# Patient Record
Sex: Female | Born: 1995 | Race: Black or African American | Hispanic: No | Marital: Single | Smoking: Never smoker
Health system: Southern US, Community
[De-identification: ages and names within clinical notes are randomized; demographics above are authoritative.]

---
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
## 2016-03-05 ENCOUNTER — Emergency Department (HOSPITAL_COMMUNITY)
Admission: EM | Admit: 2016-03-05 | Discharge: 2016-03-05 | Disposition: A | Discharge status: Home / Self Care | Payer: Self-pay | Attending: Emergency Medicine | Admitting: Emergency Medicine

## 2016-03-05 ENCOUNTER — Encounter (HOSPITAL_COMMUNITY): Payer: Self-pay | Admitting: Family Medicine

## 2016-03-05 DIAGNOSIS — J029 Acute pharyngitis, unspecified: Secondary | ICD-10-CM | POA: Insufficient documentation

## 2016-03-05 MED ORDER — ACETAMINOPHEN 325 MG PO TABS
ORAL_TABLET | ORAL | Status: AC
  Filled 2016-03-05: qty 2

## 2016-03-05 MED ORDER — OSELTAMIVIR PHOSPHATE 75 MG PO CAPS: 75.0000 mg | ORAL_CAPSULE | Freq: Two times a day (BID) | ORAL | 0 refills | Status: AC

## 2016-03-05 MED ORDER — AZITHROMYCIN 250 MG PO TABS: ORAL_TABLET | ORAL | 0 refills | Status: AC

## 2016-03-05 MED ORDER — ACETAMINOPHEN 325 MG PO TABS
650.0000 mg | ORAL_TABLET | Freq: Once | ORAL | Status: AC | PRN
  Administered 2016-03-05: 650 mg via ORAL

## 2016-03-05 NOTE — ED Triage Notes (Signed)
Pt presents from home via POV with c/o sore throat, non-productive cough, and generalized body aches that began suddenly yesterday. Pt is ambulatory in treatment area A&Ox4 in NAD. Lungs CTA

## 2016-03-05 NOTE — ED Provider Notes (Signed)
[facility]-EMERGENCY DEPT Provider Note   CSN: [id number] Arrival date & time: 03/05/16  1728  By signing my name below, I, Teofilo PodMatthew P. Jamison, attest that this documentation has been prepared under the direction and in the presence of Bethann BerkshireJoseph Libni Fusaro, MD . Electronically Signed: Teofilo PodMatthew P. Jamison, ED Scribe. 03/05/2016. 6:18 PM.    History   Chief Complaint Chief Complaint  Patient presents with  . Sore Throat  . Cough    The history is provided by the patient. No language interpreter was used.  Sore Throat  This is a new problem. The current episode started yesterday. The problem occurs constantly. The problem has not changed since onset.Associated symptoms include headaches. Pertinent negatives include no chest pain and no abdominal pain. Nothing aggravates the symptoms. Nothing relieves the symptoms. The treatment provided no relief.   HPI Comments:  Hannah Marshall is a 21 y.o. female who presents to the Emergency Department complaining of constant sore throat since yesterday. Pt complains of associated cough, ear pain, generalized body aches, headache, chills. Pt smoked marijuana 10 days ago. Pt has taken Mucinex, Robitussin, Tylenol and Dayquil with no relief. Pt denies other associated symptoms.   History reviewed. No pertinent past medical history.  There are no active problems to display for this patient.   History reviewed. No pertinent surgical history.  OB History    No data available       Home Medications    Prior to Admission medications   Not on File    Family History No family history on file.  Social History Social History  Substance Use Topics  . Smoking status: Never Smoker  . Smokeless tobacco: Never Used  . Alcohol use No     Allergies   Patient has no known allergies.   Review of Systems Review of Systems  Constitutional: Positive for chills. Negative for appetite change and fatigue.  HENT: Positive for ear pain and sore throat. Negative  for congestion, ear discharge and sinus pressure.   Eyes: Negative for discharge.  Respiratory: Positive for cough.   Cardiovascular: Negative for chest pain.  Gastrointestinal: Negative for abdominal pain and diarrhea.  Genitourinary: Negative for frequency and hematuria.  Musculoskeletal: Positive for myalgias. Negative for back pain.  Skin: Negative for rash.  Neurological: Positive for headaches. Negative for seizures.  Psychiatric/Behavioral: Negative for hallucinations.     Physical Exam Updated Vital Signs BP 101/71 (BP Location: Left Arm)   Pulse 120   Temp 102 F (38.9 C) (Oral)   Resp 16   Ht 5' (1.524 m)   Wt 198 lb (89.8 kg)   SpO2 99%   BMI 38.67 kg/m   Physical Exam  Constitutional: She is oriented to person, place, and time. She appears well-developed.  HENT:  Head: Normocephalic.  Pharynx mildly inflamed.  Eyes: Conjunctivae and EOM are normal. No scleral icterus.  Neck: Neck supple. No thyromegaly present.  Cardiovascular: Normal rate and regular rhythm.  Exam reveals no gallop and no friction rub.   No murmur heard. Pulmonary/Chest: No stridor. She has no wheezes. She has no rales. She exhibits no tenderness.  Abdominal: She exhibits no distension. There is no tenderness. There is no rebound.  Musculoskeletal: Normal range of motion. She exhibits no edema.  Lymphadenopathy:    She has no cervical adenopathy.  Neurological: She is oriented to person, place, and time. She exhibits normal muscle tone. Coordination normal.  Skin: No rash noted. No erythema.  Psychiatric: She has a normal mood and  affect. Her behavior is normal.     ED Treatments / Results  DIAGNOSTIC STUDIES:  Oxygen Saturation is 99% on RA, normal by my interpretation.    COORDINATION OF CARE:  6:18 PM Discussed treatment plan with pt at bedside and pt agreed to plan.   Labs (all labs ordered are listed, but only abnormal results are displayed) Labs Reviewed - No data to  display  EKG  EKG Interpretation None       Radiology No results found.  Procedures Procedures (including critical care time)  Medications Ordered in ED Medications  acetaminophen (TYLENOL) 325 MG tablet (not administered)  acetaminophen (TYLENOL) tablet 650 mg (650 mg Oral Given 03/05/16 1757)     Initial Impression / Assessment and Plan / ED Course  I have reviewed the triage vital signs and the nursing notes.  Pertinent labs & imaging results that were available during my care of the patient were reviewed by me and considered in my medical decision making (see chart for details).     Patient with bronchitis possibly related to influenza. Patient sent home on Tamiflu and Z-Pak and will follow-up as needed  Final Clinical Impressions(s) / ED Diagnoses   Final diagnoses:  None    New Prescriptions New Prescriptions   No medications on file  The chart was scribed for me under my direct supervision.  I personally performed the history, physical, and medical decision making and all procedures in the evaluation of this patient.Bethann Berkshire, MD 03/05/16 [phone]

## 2016-03-05 NOTE — Discharge Instructions (Signed)
Drink plenty of fluids. Take Tylenol or Motrin for fever and aches. Follow-up if not improving

## 2016-03-05 NOTE — ED Notes (Signed)
Pt provided goodrx coupons for medications since pt reported concern affording her prescriptions.

## 2020-12-28 ENCOUNTER — Inpatient Hospital Stay: Admit: 2020-12-28 | Discharge: 2020-12-28 | Payer: PRIVATE HEALTH INSURANCE

## 2020-12-28 MED ORDER — IBUPROFEN 600 MG TABLET
600 mg | Freq: Once | ORAL | Status: CP
Start: 2020-12-28 — End: ?
  Administered 2020-12-28: 15:00:00 600 mg via ORAL

## 2020-12-28 MED ORDER — ACETAMINOPHEN 325 MG TABLET
325 mg | Freq: Once | ORAL | Status: CP
Start: 2020-12-28 — End: ?
  Administered 2020-12-28: 15:00:00 325 mg via ORAL

## 2020-12-28 NOTE — ED Provider Notes
HistoryChief Complaint Patient presents with ? Flu Like Symptoms  25 year old female with no significant past medical history presenting to the ED for flu-like symptoms x3 days.  Symptoms include generalized body aches w/ CP and back pain, subjective fevers, chills, headache, sore throat, nasal congestion, nonproductive cough.  She is been taking DayQuil and NyQuil without relief.  No sick contacts, states she recently moved from [place] to [place].  She is COVID vaccinated, not flu vaccinated.  She is a nonsmoker.The history is provided by the patient. No language interpreter was used. OtherThis is a new problem. Episode onset: 3 days. The problem occurs constantly. The problem has not changed since onset.Associated symptoms include chest pain and headaches. Pertinent negatives include no abdominal pain and no shortness of breath. Nothing aggravates the symptoms. Nothing relieves the symptoms. Treatments tried: dayquil, nyquil.  No past medical history on file.No past surgical history on file.No family history on file.Social History Socioeconomic History ? Marital status: Single No existing history information found.No existing history information found.No existing history information found.Review of Systems Constitutional: Positive for chills, fatigue and fever. HENT: Positive for congestion and sore throat.  Respiratory: Positive for cough. Negative for shortness of breath.  Cardiovascular: Positive for chest pain. Gastrointestinal: Negative for abdominal pain, nausea and vomiting. Genitourinary: Negative for difficulty urinating. Musculoskeletal: Positive for back pain and myalgias. Neurological: Positive for headaches. All other systems reviewed and are negative. Physical ExamED Triage Vitals [12/28/20 0842]BP: 108/68Pulse: (!) 56Pulse from  O2 sat: n/aResp: 18Temp: (!) 100.8 ?F (38.2 ?C)Temp src: OralSpO2: 100 % BP 108/68  - Pulse (!) 56  - Temp (!) 100.8 ?F (38.2 ?C) (Oral)  - Resp 18  - SpO2 100% Physical ExamVitals and nursing note reviewed. Constitutional:     General: She is not in acute distress.   Appearance: She is well-developed. She is ill-appearing. She is not diaphoretic. HENT:    Head: Normocephalic and atraumatic.    Nose: Nose normal.    Mouth/Throat:    Mouth: Mucous membranes are dry.    Pharynx: No oropharyngeal exudate or posterior oropharyngeal erythema. Eyes:    General:       Right eye: No discharge.       Left eye: No discharge. Cardiovascular:    Rate and Rhythm: Normal rate and regular rhythm.    Heart sounds: Normal heart sounds. No murmur heard.  No friction rub. No gallop. Pulmonary:    Effort: Pulmonary effort is normal. No respiratory distress.    Breath sounds: Normal breath sounds. No stridor. No wheezing, rhonchi or rales. Chest:    Chest wall: Tenderness (generalized) present. Abdominal:    General: There is no distension.    Palpations: Abdomen is soft.    Tenderness: There is no abdominal tenderness. There is no right CVA tenderness, left CVA tenderness or guarding. Musculoskeletal:       General: No deformity. Normal range of motion.    Cervical back: Normal range of motion. Skin:   General: Skin is warm and dry. Neurological:    Mental Status: She is alert.  ProceduresProceduresResident/APP ZOX:[id number] & Plan87 year old female with no significant past medical history presenting to the ED for flu-like symptoms x3 days.  Symptoms include generalized body aches w/ CP and back pain, subjective fevers, chills, headache, sore throat, nasal congestion, nonproductive cough.  Well appearing, no distress.  BP 108/68  - Pulse (!) 56  - Temp (!) 100.8 ?F (38.2 ?C) (Oral)  - Resp 18  - SpO2 100%   Exam  as above.   Plan: ddx include influenza, COVID-19, other viral illness; considered but doubt pneumonia, do not suspect ACSLabs:  Outpatient COVID/flu swabImaging:  NoneInterventions:  Tylenol, MotrinConsults:  NoneSupportive careECC appointment for 12/12Patient stable and in no distress.  Okay for discharge.  Follow-up per discharge instructions.  Return precautions given.  Patient agrees with plan.  Dr. Shawn Route available for Graciella Belton, PA-CED CourseClinical Impressions as of 12/28/20 0945 Flu-like symptoms  ED DispositionDischarge Irma Newness, PA12/07/22 0945

## 2020-12-28 NOTE — Discharge Instructions
-  You have an ED follow up appointment on Monday 12/12-Unless a doctor has told you not to take these medications, you should take acetaminophen (Tylenol) 650mg  every 4-6 hours and/or ibuprofen (Motrin/Advil) 600mg  every 8 hours with food as needed for pain/feversDrink plenty of fluids and rest-Return to the ED for new or worsening symptoms

## 2020-12-28 NOTE — ED Notes
98:97 AM 25 year old female her with body aches, chest pain,and cough. Pt alert and oriented x 4. Speaking in full clear sentences. Pt reports not getting flu vaccine. Airway intact. No other complaints at this time.9:34 AMPt d/c home.VSS. pt ambulating with steady gait. Pt given tylenol and ibuprofen prior to leaving

## 2021-01-02 ENCOUNTER — Encounter
Admit: 2021-01-02 | Payer: PRIVATE HEALTH INSURANCE | Attending: Student in an Organized Health Care Education/Training Program | Primary: Obstetrics and Gynecology

## 2021-01-02 ENCOUNTER — Encounter: Admit: 2021-01-02 | Payer: Medicaid (Managed Care) | Primary: Obstetrics and Gynecology

## 2021-01-02 ENCOUNTER — Encounter: Admit: 2021-01-02 | Payer: PRIVATE HEALTH INSURANCE | Primary: Obstetrics and Gynecology

## 2021-05-25 ENCOUNTER — Inpatient Hospital Stay: Admit: 2021-05-25 | Discharge: 2021-05-25 | Payer: Medicaid (Managed Care) | Attending: Emergency Medicine

## 2021-05-25 MED ORDER — SODIUM CHLORIDE 0.9 % BOLUS (NEW BAG)
0.9 % | Freq: Once | INTRAVENOUS | Status: CP
Start: 2021-05-25 — End: ?
  Administered 2021-05-26: 01:00:00 0.9 mL/h via INTRAVENOUS

## 2021-05-25 MED ORDER — KETOROLAC 30 MG/ML (1 ML) INJECTION SOLUTION
301 mg/mL (1 mL) | Freq: Once | INTRAVENOUS | Status: CP
Start: 2021-05-25 — End: ?
  Administered 2021-05-26: 01:00:00 30 mL via INTRAVENOUS

## 2021-05-25 MED ORDER — ONDANSETRON 4 MG DISINTEGRATING TABLET
4 mg | ORAL_TABLET | Freq: Three times a day (TID) | ORAL | 1 refills | Status: AC | PRN
Start: 2021-05-25 — End: 2021-06-26

## 2021-05-25 MED ORDER — ONDANSETRON HCL (PF) 4 MG/2 ML INJECTION SOLUTION
42 mg/2 mL | Freq: Once | INTRAVENOUS | Status: CP
Start: 2021-05-25 — End: ?
  Administered 2021-05-26: 01:00:00 4 mL via INTRAVENOUS

## 2021-05-25 MED ORDER — METOCLOPRAMIDE 5 MG/ML INJECTION SOLUTION
5 mg/mL | Freq: Once | INTRAVENOUS | Status: CP
Start: 2021-05-25 — End: ?
  Administered 2021-05-26: 01:00:00 5 mL via INTRAVENOUS

## 2021-05-26 ENCOUNTER — Encounter: Admit: 2021-05-26 | Payer: MEDICAID | Primary: Obstetrics and Gynecology

## 2021-05-26 ENCOUNTER — Encounter
Admit: 2021-05-26 | Payer: PRIVATE HEALTH INSURANCE | Attending: Student in an Organized Health Care Education/Training Program | Primary: Obstetrics and Gynecology

## 2021-05-26 LAB — CBC WITH AUTO DIFFERENTIAL
BKR WAM ABSOLUTE IMMATURE GRANULOCYTES.: 0.03 x 1000/ÂµL (ref 0.00–0.30)
BKR WAM ABSOLUTE LYMPHOCYTE COUNT.: 2.32 x 1000/ÂµL (ref 0.60–3.70)
BKR WAM ABSOLUTE NRBC (2 DEC): 0 x 1000/ÂµL (ref 0.00–1.00)
BKR WAM ANALYZER ANC: 5.27 x 1000/ÂµL (ref 2.00–7.60)
BKR WAM BASOPHIL ABSOLUTE COUNT.: 0.03 x 1000/ÂµL (ref 0.00–1.00)
BKR WAM BASOPHILS: 0.4 % (ref 0.0–1.4)
BKR WAM EOSINOPHIL ABSOLUTE COUNT.: 0.05 x 1000/ÂµL (ref 0.00–1.00)
BKR WAM EOSINOPHILS: 0.6 % (ref 0.0–5.0)
BKR WAM HEMATOCRIT (2 DEC): 41.3 % (ref 35.00–45.00)
BKR WAM HEMOGLOBIN: 13.9 g/dL (ref 11.7–15.5)
BKR WAM IMMATURE GRANULOCYTES: 0.4 % (ref 0.0–1.0)
BKR WAM LYMPHOCYTES: 28.4 % (ref 17.0–50.0)
BKR WAM MCH (PG): 31.6 pg (ref 27.0–33.0)
BKR WAM MCHC: 33.7 g/dL (ref 31.0–36.0)
BKR WAM MCV: 93.9 fL (ref 80.0–100.0)
BKR WAM MONOCYTE ABSOLUTE COUNT.: 0.48 x 1000/ÂµL (ref 0.00–1.00)
BKR WAM MONOCYTES: 5.9 % (ref 4.0–12.0)
BKR WAM MPV: 10 fL (ref 8.0–12.0)
BKR WAM NEUTROPHILS: 64.3 % (ref 39.0–72.0)
BKR WAM NUCLEATED RED BLOOD CELLS: 0 % (ref 0.0–1.0)
BKR WAM PLATELETS: 262 x1000/ÂµL (ref 150–420)
BKR WAM RDW-CV: 12 % (ref 11.0–15.0)
BKR WAM RED BLOOD CELL COUNT.: 4.4 M/ÂµL (ref 4.00–6.00)
BKR WAM WHITE BLOOD CELL COUNT: 8.2 x1000/ÂµL (ref 4.0–11.0)

## 2021-05-26 LAB — HEPATIC FUNCTION PANEL
BKR A/G RATIO: 1.6 (ref 1.0–2.2)
BKR ALANINE AMINOTRANSFERASE (ALT): 14 U/L (ref 10–35)
BKR ALBUMIN: 4.5 g/dL (ref 3.6–4.9)
BKR ALKALINE PHOSPHATASE: 62 U/L (ref 9–122)
BKR ASPARTATE AMINOTRANSFERASE (AST): 24 U/L (ref 10–35)
BKR AST/ALT RATIO: 1.7
BKR BILIRUBIN DIRECT: <0.2 mg/dL (ref <=0.3)
BKR BILIRUBIN TOTAL: 0.2 mg/dL (ref <=1.2)
BKR GLOBULIN: 2.8 g/dL (ref 2.3–3.5)
BKR PROTEIN TOTAL: 7.3 g/dL (ref 6.6–8.7)

## 2021-05-26 LAB — URINALYSIS WITH CULTURE REFLEX      (BH LMW YH)
BKR BILIRUBIN, UA: NEGATIVE
BKR BLOOD, UA: NEGATIVE
BKR GLUCOSE, UA: NEGATIVE
BKR NITRITE, UA: NEGATIVE
BKR PH, UA: 6 (ref 5.5–7.5)
BKR SPECIFIC GRAVITY, UA: 1.036 — ABNORMAL HIGH (ref 1.005–1.030)
BKR UROBILINOGEN, UA (MG/DL): <2 mg/dL (ref <=2.0)

## 2021-05-26 LAB — BASIC METABOLIC PANEL
BKR ANION GAP: 10 (ref 7–17)
BKR BLOOD UREA NITROGEN: 12 mg/dL (ref 6–20)
BKR BUN / CREAT RATIO: 16 (ref 8.0–23.0)
BKR CALCIUM: 9 mg/dL (ref 8.8–10.2)
BKR CHLORIDE: 105 mmol/L (ref 98–107)
BKR CO2: 25 mmol/L (ref 20–30)
BKR CREATININE: 0.75 mg/dL (ref 0.40–1.30)
BKR EGFR, CREATININE (CKD-EPI 2021): >60 mL/min/{1.73_m2} (ref >=60)
BKR GLUCOSE: 139 mg/dL — ABNORMAL HIGH (ref 70–100)
BKR POTASSIUM: 3.8 mmol/L (ref 3.3–5.3)
BKR SODIUM: 140 mmol/L (ref 136–144)

## 2021-05-26 LAB — URINE MICROSCOPIC     (BH GH LMW YH)
BKR RBC/HPF INSTRUMENT: 2 /HPF (ref 0–2)
BKR URINE SQUAMOUS EPITHELIAL CELLS, UA (NUMERIC): 5 /HPF (ref 0–5)
BKR WBC/HPF INSTRUMENT: 9 /HPF — ABNORMAL HIGH (ref 0–5)

## 2021-05-26 LAB — URINE CULTURE

## 2021-05-26 LAB — MAGNESIUM: BKR MAGNESIUM: 2.1 mg/dL (ref 1.7–2.4)

## 2021-05-26 LAB — UA REFLEX CULTURE

## 2021-05-26 NOTE — Discharge Instructions
Drink plenty of fluids.  Take ondansetron as prescribed if needed for nausea.Return to emergency department if your symptoms worsen.Follow-up at the extended care clinic for re-evaluation:  Friday, May 5th at 6:10 PM for re-evaluation

## 2021-05-26 NOTE — ED Provider Notes
Chief Complaint Patient presents with ? Headache- New Onset Or New Symptoms   pt comes in for nausea and headache x 2 days, pt reports sudden onset. pt denies abdominal pain. reports possible pregnancy but menstraul period ended may 2nd. Did not take meds for headache prior to arrival.  ? Emesis Medical Decision Making30 year old female in ED complaining of not feeling well starting 3 days ago with decreased appetite, nausea, vomiting, subjective fever; generalized headache w photophobia.  Patient states anything she tries to eat or drink a just comes up.  Feels lightheaded.  Denies abdominal pain, diarrhea.  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  No nuchal rigidityLungs clear equal.  Heart rate regular rhythm without murmurNontender abdomenConcern for dehydration, electrolyte abnormality.Doubt and unlikely meningitis, intracranial bleed, temporal arteritisMost likely viral syndromePlan: Check BMP, CBC, hCG, UA.  Treat with IV fluid, antiemetic, pain control with NSAID.-------------------------------------------------------------------------------- Lab work reassuringHCG negativeUA: 9 wbc, few bacteria.Patient denies dysuria or urinary frequency.  Denies abdominal pain.  No abdominal tenderness on exam.Symptoms improved after IV fluid, antiemeticPatient DC home with Rx ZofranFollow-up at extended care clinic.  Return precautions discussed with patient.-T.Morris PA-C-------------------------------------------------------------------------------- Amount and/or Complexity of Data ReviewedLabs: ordered.RiskPrescription drug management.Diagnosis or treatment significantly limited by social determinants of health.  Physical ExamED Triage Vitals [05/25/21 1915]BP: 114/69Pulse: (!) 106Pulse from  O2 sat: n/aResp: 18Temp: 98.5 ?F (36.9 ?C)Temp src: TemporalSpO2: 100 % BP 98/67  - Pulse 88  - Temp 98.5 ?F (36.9 ?C) (Temporal)  - Resp 16  - Ht 5' (1.524 m)  - Wt 45.4 kg  - SpO2 100%  - BMI 19.53 kg/m? Physical Exam ProceduresAttestation/Critical CarePatient Reevaluation: ED Attestation: PA/APRNFace to face evaluation was performed by me in collaboration with the Advanced Practice Provider to assess for significant health threats. I provided a substantive portion of the care of this patient.? I personally performed medically appropriate history and physical exam and the MDM: 26 year old female presented emergency department complaining of headache.  Patient reports that she had onset of nausea and vomiting approximally 3 days ago.  Vomiting is nonbloody.  Patient stated over the subsequent days she developed generalized malaise, intermittent subjective fever and headache.On physical exam patient is in no acute distress with reassuring vital signs.  Breath sounds are clear and equal bilaterally and patient has a regular heart rate and rhythm.  Abdomen is soft and nontender.  Patient has supple neck and no neurologic deficits.Impression:  Presentation consistent with viral gastroenteritis, very low index of suspicion for meningitis given history and exam-labs-IV fluids-symptomatic treatmentMark K RollinsClinical Impressions as of 05/26/21 1155 Viral syndrome  ED DispositionDischarge Wonda Horner, PA05/04/23 2223 Trey Sailors, DO05/05/23 1155

## 2021-05-26 NOTE — ED Notes
26 yo female arrives via walkin for c/o HA/nausea x3 days. Pt states that she started to have fatigue, subjective fevers, nausea w/ no AP, and intense HA. Pt neurologically intact, denies sick contacts, currently working clinicals in health care facility. A+Ox4, GCS15, and speaking in clear sentences. Pending provider eval. 8:23 PM Pt gives urine sample. Urine sent and upreg sent. 8:48 PM PIV obtained, medicated per MAR. HA 10/10 at this time. Pending reeval of pain. 9:32 PM Pt endorsing improvement of HA and nausea. 10:31 PM PIV removed and VSS. Pending paperwork.

## 2021-06-23 ENCOUNTER — Inpatient Hospital Stay
Admit: 2021-06-23 | Discharge: 2021-06-26 | Payer: MEDICAID | Attending: Student in an Organized Health Care Education/Training Program | Admitting: Student in an Organized Health Care Education/Training Program

## 2021-06-23 ENCOUNTER — Emergency Department: Admit: 2021-06-23 | Payer: MEDICAID | Primary: Obstetrics and Gynecology

## 2021-06-23 DIAGNOSIS — N730 Acute parametritis and pelvic cellulitis: Secondary | ICD-10-CM

## 2021-06-23 LAB — BASIC METABOLIC PANEL
BKR ANION GAP: 12 (ref 7–17)
BKR BLOOD UREA NITROGEN: 13 mg/dL (ref 6–20)
BKR BUN / CREAT RATIO: 21.7 (ref 8.0–23.0)
BKR CALCIUM: 7.6 mg/dL — ABNORMAL LOW (ref 8.8–10.2)
BKR CHLORIDE: 115 mmol/L — ABNORMAL HIGH (ref 98–107)
BKR CO2: 17 mmol/L — ABNORMAL LOW (ref 20–30)
BKR CREATININE: 0.6 mg/dL (ref 0.40–1.30)
BKR EGFR, CREATININE (CKD-EPI 2021): >60 mL/min/{1.73_m2} (ref >=60)
BKR GLUCOSE: 95 mg/dL (ref 70–100)
BKR POTASSIUM: 3.3 mmol/L (ref 3.3–5.3)
BKR SODIUM: 144 mmol/L (ref 136–144)

## 2021-06-23 LAB — URINALYSIS WITH CULTURE REFLEX      (BH LMW YH)
BKR BILIRUBIN, UA: NEGATIVE
BKR BLOOD, UA: NEGATIVE
BKR GLUCOSE, UA: NEGATIVE
BKR NITRITE, UA: NEGATIVE
BKR PH, UA: 6 (ref 5.5–7.5)
BKR SPECIFIC GRAVITY, UA: 1.031 — ABNORMAL HIGH (ref 1.005–1.030)
BKR UROBILINOGEN, UA (MG/DL): <2 mg/dL (ref <=2.0)

## 2021-06-23 LAB — HEPATIC FUNCTION PANEL
BKR A/G RATIO: 1.6 (ref 1.0–2.2)
BKR ALANINE AMINOTRANSFERASE (ALT): 10 U/L (ref 10–35)
BKR ALBUMIN: 3.7 g/dL (ref 3.6–4.9)
BKR ALKALINE PHOSPHATASE: 53 U/L (ref 9–122)
BKR ASPARTATE AMINOTRANSFERASE (AST): 25 U/L (ref 10–35)
BKR AST/ALT RATIO: 2.5
BKR BILIRUBIN DIRECT: <0.2 mg/dL (ref <=0.3)
BKR BILIRUBIN TOTAL: 0.4 mg/dL (ref <=1.2)
BKR GLOBULIN: 2.3 g/dL (ref 2.3–3.5)
BKR PROTEIN TOTAL: 6 g/dL — ABNORMAL LOW (ref 6.6–8.7)

## 2021-06-23 LAB — CBC WITH AUTO DIFFERENTIAL
BKR WAM ABSOLUTE IMMATURE GRANULOCYTES.: 0.06 x 1000/ÂµL (ref 0.00–0.30)
BKR WAM ABSOLUTE LYMPHOCYTE COUNT.: 0.64 x 1000/ÂµL (ref 0.60–3.70)
BKR WAM ABSOLUTE NRBC (2 DEC): 0 x 1000/ÂµL (ref 0.00–1.00)
BKR WAM ANALYZER ANC: 13.29 x 1000/ÂµL — ABNORMAL HIGH (ref 2.00–7.60)
BKR WAM BASOPHIL ABSOLUTE COUNT.: 0.03 x 1000/ÂµL (ref 0.00–1.00)
BKR WAM BASOPHILS: 0.2 % (ref 0.0–1.4)
BKR WAM EOSINOPHIL ABSOLUTE COUNT.: 0.03 x 1000/ÂµL (ref 0.00–1.00)
BKR WAM EOSINOPHILS: 0.2 % (ref 0.0–5.0)
BKR WAM HEMATOCRIT (2 DEC): 41.6 % (ref 35.00–45.00)
BKR WAM HEMOGLOBIN: 13.8 g/dL (ref 11.7–15.5)
BKR WAM IMMATURE GRANULOCYTES: 0.4 % (ref 0.0–1.0)
BKR WAM LYMPHOCYTES: 4.4 % — ABNORMAL LOW (ref 17.0–50.0)
BKR WAM MCH (PG): 31.4 pg (ref 27.0–33.0)
BKR WAM MCHC: 33.2 g/dL (ref 31.0–36.0)
BKR WAM MCV: 94.5 fL (ref 80.0–100.0)
BKR WAM MONOCYTE ABSOLUTE COUNT.: 0.6 x 1000/ÂµL (ref 0.00–1.00)
BKR WAM MONOCYTES: 4.1 % (ref 4.0–12.0)
BKR WAM MPV: 10.3 fL (ref 8.0–12.0)
BKR WAM NEUTROPHILS: 90.7 % — ABNORMAL HIGH (ref 39.0–72.0)
BKR WAM NUCLEATED RED BLOOD CELLS: 0 % (ref 0.0–1.0)
BKR WAM PLATELETS: 172 x1000/ÂµL (ref 150–420)
BKR WAM RDW-CV: 11.9 % (ref 11.0–15.0)
BKR WAM RED BLOOD CELL COUNT.: 4.4 M/ÂµL (ref 4.00–6.00)
BKR WAM WHITE BLOOD CELL COUNT: 14.7 x1000/ÂµL — ABNORMAL HIGH (ref 4.0–11.0)

## 2021-06-23 LAB — URINE MICROSCOPIC     (BH GH LMW YH)
BKR HYALINE CASTS, UA INSTRUMENT (NUMERIC): 2 /LPF (ref 0–3)
BKR RBC/HPF INSTRUMENT: 1 /HPF (ref 0–2)
BKR URINE SQUAMOUS EPITHELIAL CELLS, UA (NUMERIC): 11 /HPF — ABNORMAL HIGH (ref 0–5)
BKR WBC/HPF INSTRUMENT: 8 /HPF — ABNORMAL HIGH (ref 0–5)

## 2021-06-23 LAB — UA REFLEX CULTURE

## 2021-06-23 LAB — LIPASE: BKR LIPASE: 19 U/L (ref 11–55)

## 2021-06-23 LAB — HCG, QUANTITATIVE     (BH GH LMW YH): BKR HCG, QUANTITATIVE: <1 m[IU]/mL

## 2021-06-23 MED ORDER — CEFTRIAXONE IV PUSH 1000 MG VIAL & NS (ADULTS)
Freq: Once | INTRAVENOUS | Status: CP
Start: 2021-06-23 — End: ?
  Administered 2021-06-23: 21:00:00 10.000 mL via INTRAVENOUS

## 2021-06-23 MED ORDER — DOXYCYCLINE TAB/CAP 100 MG (WRAPPED E-RX)
100 mg | Freq: Two times a day (BID) | ORAL | Status: DC
Start: 2021-06-23 — End: 2021-06-24
  Administered 2021-06-24: 08:00:00 100 mg via ORAL

## 2021-06-23 MED ORDER — KETOROLAC 30 MG/ML (1 ML) INJECTION SOLUTION
30 mg/mL (1 mL) | Freq: Once | INTRAVENOUS | Status: CP
Start: 2021-06-23 — End: ?
  Administered 2021-06-23: 23:00:00 30 mL via INTRAVENOUS

## 2021-06-23 MED ORDER — ONDANSETRON 4 MG DISINTEGRATING TABLET
4 mg | Freq: Four times a day (QID) | ORAL | Status: DC | PRN
Start: 2021-06-23 — End: 2021-06-24

## 2021-06-23 MED ORDER — ACETAMINOPHEN 325 MG TABLET
325 mg | Freq: Three times a day (TID) | ORAL | Status: DC | PRN
Start: 2021-06-23 — End: 2021-06-25

## 2021-06-23 MED ORDER — ONDANSETRON HCL (PF) 4 MG/2 ML INJECTION SOLUTION
4 mg/2 mL | Freq: Once | INTRAVENOUS | Status: CP
Start: 2021-06-23 — End: ?
  Administered 2021-06-23: 23:00:00 4 mL via INTRAVENOUS

## 2021-06-23 MED ORDER — ONDANSETRON HCL (PF) 4 MG/2 ML INJECTION SOLUTION
4 mg/2 mL | Freq: Once | INTRAVENOUS | Status: CP
Start: 2021-06-23 — End: ?
  Administered 2021-06-23: 19:00:00 4 mL via INTRAVENOUS

## 2021-06-23 MED ORDER — CEFTRIAXONE 1 GRAM SOLUTION FOR INJECTION
1 gram | INTRAMUSCULAR | Status: DC
Start: 2021-06-23 — End: 2021-06-26
  Administered 2021-06-24 – 2021-06-25 (×2): 1 mL via INTRAMUSCULAR

## 2021-06-23 MED ORDER — DOXYCYCLINE TAB/CAP 100 MG (WRAPPED E-RX)
100 mg | Freq: Once | ORAL | Status: CP
Start: 2021-06-23 — End: ?
  Administered 2021-06-23: 21:00:00 100 mg via ORAL

## 2021-06-23 MED ORDER — DEXTROSE 5 % AND 0.45 % SODIUM CHLORIDE INTRAVENOUS SOLUTION
INTRAVENOUS | Status: DC
Start: 2021-06-23 — End: 2021-06-25
  Administered 2021-06-24 – 2021-06-25 (×5): 1000.000 mL/h via INTRAVENOUS

## 2021-06-23 MED ORDER — KETOROLAC 30 MG/ML (1 ML) INJECTION SOLUTION
30 mg/mL (1 mL) | Freq: Once | INTRAVENOUS | Status: CP
Start: 2021-06-23 — End: ?
  Administered 2021-06-23: 19:00:00 30 mL via INTRAVENOUS

## 2021-06-23 MED ORDER — SODIUM CHLORIDE 0.9 % BOLUS (NEW BAG)
0.9 % | Freq: Once | INTRAVENOUS | Status: CP
Start: 2021-06-23 — End: ?
  Administered 2021-06-23: 19:00:00 0.9 mL/h via INTRAVENOUS

## 2021-06-23 MED ORDER — COENZYME Q10 30 MG CAPSULE
30 mg | Freq: Every day | ORAL | Status: AC
Start: 2021-06-23 — End: 2021-12-13

## 2021-06-23 MED ORDER — METRONIDAZOLE 500 MG/100 ML IN SODIUM CHLOR(ISO) INTRAVENOUS PIGGYBACK
500100 mg/100 mL | Freq: Three times a day (TID) | INTRAVENOUS | Status: DC
Start: 2021-06-23 — End: 2021-06-26
  Administered 2021-06-24 – 2021-06-26 (×7): 500 mL/h via INTRAVENOUS

## 2021-06-23 MED ORDER — SODIUM CHLORIDE 0.9 % (FLUSH) INJECTION SYRINGE
0.9 % | INTRAVENOUS | Status: DC | PRN
Start: 2021-06-23 — End: 2021-06-27

## 2021-06-23 MED ORDER — IBUPROFEN 600 MG TABLET
600 mg | Freq: Four times a day (QID) | ORAL | Status: DC | PRN
Start: 2021-06-23 — End: 2021-06-27

## 2021-06-23 MED ORDER — MULTIVITAMIN TABLET
Freq: Every day | ORAL | Status: AC
Start: 2021-06-23 — End: 2021-12-13

## 2021-06-23 MED ORDER — SODIUM CHLORIDE 0.9 % (FLUSH) INJECTION SYRINGE
0.9 % | Freq: Three times a day (TID) | INTRAVENOUS | Status: DC
Start: 2021-06-23 — End: 2021-06-27
  Administered 2021-06-24 – 2021-06-26 (×3): 0.9 mL via INTRAVENOUS

## 2021-06-23 NOTE — Utilization Review (ED)
UM Status: Commercial - IP LLQ abdominal pain, N/V, [place] suspicious for left hydrosalpinx/pyosalpinx, Wbc 14.7, IV abx, IV zofran, pain control, GYN consultJulianna DiMichele Murphy, BSN, RNUtilization ManagementMHB [phone]

## 2021-06-23 NOTE — ED Notes
6:58 PM Transfer from [facility] for ? Ovarian abscess here for gyn consult. Denies pain at this time. A&Ox4

## 2021-06-23 NOTE — ED Notes
7:16 PM Transfer from [facility] lower abdo pain, ovarian abscess, GYN consult.

## 2021-06-23 NOTE — ED Notes
12:59 PM BIBA coming from urgent care with a cc of LLQ abdominal pain, NV since 730am this morning. Got 4 of zofran, VSS. BGWNL. Reports sick contacts at work. Was referred to ED from [facility] due to fall onto knees at [facility]. -LOC, -thinners, -headstrike. Pt is AO4, GCS15, non-toxic appearing. Denies UTI symptoms.1:30 PM Pt to US.4:16 PM Pt ambulates to bathroom without difficulty.4:52 PM [facility] stretcher booked, report given to [facility] ambulance bay.6:38 PM Pt reporting returning pain and nausea, verbal order from Dr. Reesa Chew for 15 mg IV toradol and 4 mg IV zofran. Given. Reprot given to [facility], care transferred.

## 2021-06-24 ENCOUNTER — Inpatient Hospital Stay: Admit: 2021-06-24 | Payer: MEDICAID

## 2021-06-24 DIAGNOSIS — N730 Acute parametritis and pelvic cellulitis: Secondary | ICD-10-CM

## 2021-06-24 LAB — CBC WITH AUTO DIFFERENTIAL
BKR WAM ABSOLUTE IMMATURE GRANULOCYTES.: 0.04 x 1000/ÂµL (ref 0.00–0.30)
BKR WAM ABSOLUTE LYMPHOCYTE COUNT.: 1.71 x 1000/ÂµL (ref 0.60–3.70)
BKR WAM ABSOLUTE NRBC (2 DEC): 0 x 1000/ÂµL (ref 0.00–1.00)
BKR WAM ANALYZER ANC: 8.43 x 1000/ÂµL — ABNORMAL HIGH (ref 2.00–7.60)
BKR WAM BASOPHIL ABSOLUTE COUNT.: 0.02 x 1000/ÂµL (ref 0.00–1.00)
BKR WAM BASOPHILS: 0.2 % (ref 0.0–1.4)
BKR WAM EOSINOPHIL ABSOLUTE COUNT.: 0.03 x 1000/ÂµL (ref 0.00–1.00)
BKR WAM EOSINOPHILS: 0.3 % (ref 0.0–5.0)
BKR WAM HEMATOCRIT (2 DEC): 42.5 % (ref 35.00–45.00)
BKR WAM HEMOGLOBIN: 14 g/dL (ref 11.7–15.5)
BKR WAM IMMATURE GRANULOCYTES: 0.4 % (ref 0.0–1.0)
BKR WAM LYMPHOCYTES: 15.4 % — ABNORMAL LOW (ref 17.0–50.0)
BKR WAM MCH (PG): 31.1 pg (ref 27.0–33.0)
BKR WAM MCHC: 32.9 g/dL (ref 31.0–36.0)
BKR WAM MCV: 94.4 fL (ref 80.0–100.0)
BKR WAM MONOCYTE ABSOLUTE COUNT.: 0.85 x 1000/ÂµL (ref 0.00–1.00)
BKR WAM MONOCYTES: 7.7 % (ref 4.0–12.0)
BKR WAM MPV: 10.8 fL (ref 8.0–12.0)
BKR WAM NEUTROPHILS: 76 % — ABNORMAL HIGH (ref 39.0–72.0)
BKR WAM NUCLEATED RED BLOOD CELLS: 0 % (ref 0.0–1.0)
BKR WAM PLATELETS: 172 x1000/ÂµL (ref 150–420)
BKR WAM RDW-CV: 11.9 % (ref 11.0–15.0)
BKR WAM RED BLOOD CELL COUNT.: 4.5 M/ÂµL (ref 4.00–6.00)
BKR WAM WHITE BLOOD CELL COUNT: 11.1 x1000/ÂµL — ABNORMAL HIGH (ref 4.0–11.0)

## 2021-06-24 LAB — PT/INR AND PTT (BH GH L LMW YH)
BKR INR: 1.13 (ref 0.92–1.19)
BKR PARTIAL THROMBOPLASTIN TIME: 26.6 s (ref 23.0–31.4)
BKR PROTHROMBIN TIME: 11.7 s (ref 9.6–12.3)

## 2021-06-24 LAB — NEISSERIA GONORRHEA, NAAT (LAB ORDER ONLY)   (BH GH L LMW YH): BKR NEISSERIA GONORRHOEAE, DNA PROBE: NEGATIVE

## 2021-06-24 LAB — CHLAMYDIA TRACHOMATIS, NAAT (LAB ORDER ONLY) (BH GH L LMW YH): BKR CHLAMYDIA, DNA PROBE: NEGATIVE

## 2021-06-24 LAB — URINE CULTURE

## 2021-06-24 LAB — TRICHOMONAS VAGINALIS BY NAAT: BKR TRICHOMONAS VAGINALIS NAAT: POSITIVE — AB

## 2021-06-24 MED ORDER — ONDANSETRON 4 MG DISINTEGRATING TABLET
4 mg | Freq: Four times a day (QID) | ORAL | Status: DC
Start: 2021-06-24 — End: 2021-06-27
  Administered 2021-06-24 – 2021-06-26 (×10): 4 mg via ORAL

## 2021-06-24 MED ORDER — IOHEXOL (OMNIPAQUE) 350 MG/ML ORAL SOLUTION 25 ML IN WATER 900 ML
Freq: Once | ORAL | Status: DC
Start: 2021-06-24 — End: 2021-06-24

## 2021-06-24 MED ORDER — OXYCODONE IMMEDIATE RELEASE 5 MG TABLET
5 mg | ORAL | Status: DC | PRN
Start: 2021-06-24 — End: 2021-06-27
  Administered 2021-06-25 – 2021-06-26 (×2): 5 mg via ORAL

## 2021-06-24 MED ORDER — IOHEXOL (OMNIPAQUE) 350 MG/ML ORAL SOLUTION 25 ML IN WATER 900 ML
Freq: Once | ORAL | Status: CP
Start: 2021-06-24 — End: ?
  Administered 2021-06-24: 07:00:00 900.000 mL via ORAL

## 2021-06-24 MED ORDER — DOXYCYCLINE 100MG MBP
Freq: Two times a day (BID) | INTRAVENOUS | Status: DC
Start: 2021-06-24 — End: 2021-06-25
  Administered 2021-06-24: 23:00:00 100.000 mL/h via INTRAVENOUS

## 2021-06-24 MED ORDER — METOCLOPRAMIDE 5 MG/ML INJECTION SOLUTION
5 mg/mL | Freq: Four times a day (QID) | INTRAVENOUS | Status: DC | PRN
Start: 2021-06-24 — End: 2021-06-25

## 2021-06-24 MED ORDER — SODIUM CHLORIDE 0.9 % LARGE VOLUME SYRINGE FOR AUTOINJECTOR
Freq: Once | INTRAVENOUS | Status: CP | PRN
Start: 2021-06-24 — End: ?
  Administered 2021-06-24: 18:00:00 via INTRAVENOUS

## 2021-06-24 MED ORDER — HYDROMORPHONE 2 MG/ML INJECTION SOLUTION
2 mg/mL | INTRAVENOUS | Status: DC | PRN
Start: 2021-06-24 — End: 2021-06-26
  Administered 2021-06-24 – 2021-06-25 (×2): 2 mL via INTRAVENOUS

## 2021-06-24 MED ORDER — IOHEXOL 350 MG IODINE/ML INTRAVENOUS SOLUTION
350 mg iodine/mL | Freq: Once | INTRAVENOUS | Status: CP | PRN
Start: 2021-06-24 — End: ?
  Administered 2021-06-24: 18:00:00 350 mL via INTRAVENOUS

## 2021-06-24 MED ORDER — MORPHINE 2 MG/ML INJECTION SYRINGE
2 mg/mL | INTRAVENOUS | Status: DC | PRN
Start: 2021-06-24 — End: 2021-06-24

## 2021-06-24 MED ORDER — OXYCODONE IMMEDIATE RELEASE 5 MG TABLET
5 mg | Freq: Four times a day (QID) | ORAL | Status: DC | PRN
Start: 2021-06-24 — End: 2021-06-27
  Administered 2021-06-24 – 2021-06-25 (×3): 5 mg via ORAL

## 2021-06-24 MED ORDER — ONDANSETRON HCL (PF) 4 MG/2 ML INJECTION SOLUTION
4 mg/2 mL | Freq: Three times a day (TID) | INTRAVENOUS | Status: DC | PRN
Start: 2021-06-24 — End: 2021-06-26
  Administered 2021-06-24: 09:00:00 4 mL via INTRAVENOUS

## 2021-06-24 MED ORDER — HYDROMORPHONE 2 MG/ML INJECTION SOLUTION
2 mg/mL | INTRAVENOUS | Status: DC | PRN
Start: 2021-06-24 — End: 2021-06-24

## 2021-06-24 NOTE — Plan of Care
Plan of Care Overview/ Patient Status    Admission Note Nursing Mariah Ferguson is a 26 y.o. female admitted with a chief complaint of PID. Patient arrived from EDPatient is alert and oriented x4. VSS on RA. Patient c/o severe abdominal pain on admission, PRN oxycodone 5 mg given with +effect. Patient c/o pain to IV site. Line removed and replaced with new #22 on R forearm, flushes well. IVF running. NPO maintained for possible IR evaluation. Skin intact. Denies N/V/D. Voids spontaneously. +BS/flatus. Patient reports LBM 6/2. Oral contrast started at 0300 this morning for pending [place] of abdomen. OK per provider and IR team patient drinks contrast. SBA OOB. Patient prefers to use RW OOB d/t fall yesterday. Labs drawn. Call bell and belongings within reach. Patient calls appropriately for assistance. Around 0500 Patient nauseous and vomiting unable to tolerate oral contrast, [place] scan and provider made aware. IV Zofran given with some effect. Patient complaining of abdominal pain and says she cannot tolerate a pain pill and it will hurt her stomach. Provider aware. IV Dilaudid given pending effect. Vitals:  06/23/21 1256 06/23/21 1724 06/24/21 0007 06/24/21 0029 BP: (!) 110/51 105/66 115/83  Pulse: 86 72 86  Resp: 20 18 19   Temp: 97.2 ?F (36.2 ?C)  98.5 ?F (36.9 ?C)  TempSrc: Oral  Oral  SpO2: 100% 100% 100%  Weight:    44 kg Oxygen therapy Oxygen TherapySpO2: 100 %Device (Oxygen Therapy): room airI have reviewed the patient's current medication orders..I have reviewed patient valuables Belongings charted in last 7 days: Valuable(s) : Clothing; Cell Phone (06/24/2021 12:09 AM) Comments:See flowsheets, patient education and plan of care for additional information.

## 2021-06-24 NOTE — ED Notes
11:52 PM Floor Handoff Telemetry: 	[x]  Yes		[]  NoCode Status:   [x]  Full		[]  DNR		[]  DNI		Other (specify):Safety Precautions: [x]  Fall Risk  []  Sitter   []  Restraints	[]  Suicidal	[]  None	Other (specify):Mentation/Orientation:	 A&O (Self, person, place, time) x    4      	 Disoriented to:                    	 Special Accommodations: []  Hearing impaired   []  Blind  []  Nonverbal  []  Cognitive impairmentOxygenation Upon Admission: []  RA	[]  NC	[]  Venti  []  Simple Mask []  Other	Baseline O2 Status? []  Yes	[]  NoAmbulation: []  Independent	[]  Cane   []  Walker	[]  Wheelchair	[]  Bedbound		[]  Hemiplegic	[]  Paraplegic	[]  QuadraplegicEliminiation: []  Independent	[]  Commode	[]  Bedpan/Urinal  []  Straight Cath []  Foley cath			[]  Urostomy	[]  Colostomy	Other (specify):Diarrhea/Loose stool : []  1x within 24h  []  2x within 24h  []  3x within 24h  []  None 	C.Diff Order: 	[]  Ordered- needs to be collected             []  Collected-sent to lab             []  Resulted - Negative C.Diff             []  Resulted - Positive C.Diff[]  Not Ordered   []  N/ASkin Alteration: []  Pressure Injury []  Wound []  None []  Skin not assessedDiet: []  Regular/No order placed	[]  NPO		Other (specify):IV Access: []  PIV   []  PICC    []  Port    [] Central line    []  A-line    Other (specify)IVF/GTT Running Upon ED Departure? []  No	    []  Yes (specify):Outstanding Meds/Treatments/Tests:Patient Belongings:Are the belongings documented?          []  No	    []  YesIs someone taking belongings home?   []  No     []  Yes  Who? (specify)                                   ED RN and Contact number/MHB #:

## 2021-06-24 NOTE — Plan of Care
Plan of Care Overview/ Patient Status    0700-1900Pt A&Ox4, calm. VSS on RA. Denies CP/SOB. Reports mild LLQ pain, not requiring pain intervntion at this time. NPO for possible IR drainage, nausea w/o emesis controlled with scheduled Zofran. Voids spontaneously to toilet/female urinal. Lower quadrants firm/tender, last BM 6/2. SBA, using RW at times for weakness. Skin intact. R 22g patent. D5 1/2 NS at 155ml/hr. Pt able to tolerate about 3/4 of PO contrast for [place] A/P. Safety maintained, call bell within reach, wctm. Problem: Adult Inpatient Plan of CareGoal: Plan of Care ReviewOutcome: Interventions implemented as appropriate

## 2021-06-24 NOTE — Other
-  CONSULT  REQUEST  DOC[facility]ATION-[facility] NOTE-Type of consult: [facility] Interventional Radiology -New Consult: [id number] [name] /Location: 14240/14240-A / Brief Clinical Question: 26 y/o with 7 cm left ptosalpinx. Drainage?/Callback Cell Phone: [phone] / Please confirm receipt of this message by texting back ?OK?-2 [place] Page sent to Interventional Radiology [facility] [phone] at 8:54 AM.-Rainier Feuerborn Dick6/3/20238:54 [facility] [phone]

## 2021-06-24 NOTE — ED Provider Notes
Chief Complaint Patient presents with ? Abdominal Pain   To ED from urgent care for abdominal pain N/V. EMS gave 4mg  IV zofran. Pain started this morning.  HPI/PE:26 yo F sent in from urgent care with n/v (nbnb emesis) and abdominal pain (llq, began after n/v) x 1 day. She had a near syncopal episode where she felt lightheaded and they sent her here. No syncope, head strike, trauma. Pt has normal bm this morning. Works in facility, lots of residents with gi illness currently. States period is currently 2 days late but does not think she is pregnant. No hx abdominal surgeries.On exam, vss, non toxic appearing, mild left adnexal ttp, no ruq ttp, no rlq ttp. Will obtain labs to eval for aki vs electrolyte disturbance vs pancreatitis. Also consider gastritis vs gastroenteritis (esp. Considering exposure at work). Check hcg to eval for iup vs pregnancy. Given abdominal tenderness, will obtain TVUS to eval for torsion, also consider menstrual pain as pt is due for her period. Doubt appy vs biliary pathology. [place] with concern for pyosalpinx. + CMT and left adnexal ttp on my pelvic exam, chaperoned by EDT. No abnormal dc. Pt states she was treated for PID at a [place] ed in [place] last summer but did not receive a pelvic exam or sti screening at that time, was just started on abx. Last sexually active 05/15/21.D/w gyn, plan for ed to ed transfer for gyn eval, to eval for possible TOASign out given to Dr. Starleen Blue. Amedeo Gory MD [facility] resident course:7:00 PM, patient completed transfer to [facility]. Reviewed ED course, concern for PID/TOA. Patient with improved pain and nausea with ketorolac/zofran given at [facility]. HDS, afebrile. LLQ tender to palpation. Remainder of exam is normal. Called GYN, will see patient.10:00PM: Received call from GYN. Plan to admit, start CTX/flagyl, NPO midnight for IR evaluation Physical ExamED Triage Vitals [06/23/21 1256]BP: (!) 110/51Pulse: 86Pulse from O2 sat: n/aResp: 20Temp: 97.2 ?F (36.2 ?C)Temp src: OralSpO2: 100 % BP (!) 110/51  - Pulse 86  - Temp 97.2 ?F (36.2 ?C) (Oral)  - Resp 20  - SpO2 100% Physical Exam ProceduresAttestation/Critical CarePatient Reevaluation: -------------------------------------------------------------[facility] ED Attending Continuity of Care Addendum:Attending Supervised: ResidentI saw and examined the patient. I agree with the findings and plan of care as documented in the resident's note except as noted below. Additional acute and/or chronic problems addressed: Transfer from [facility] ED to [facility] ED for GYN evaluation and for admission to GYN for PID/TOA. Pt awake alert NAD. Dr. Johnston Ebbs d/w GYN. Ceftriaxone/Flagyl, NPO, admit to GYN.Ivor Messier, MDEmergency Medicine AttendingPlease call with questions - available via MHB-------------------------------------------------------------Comments as of 06/24/21 0148 Fri Jun 23, 2021 1639 At 4:39 PM the care of Mariah Ferguson was turned over to me by a departing provider via established practice protocol. I discussed the patient's previous care, medications, & likely disposition with the outgoing provider. This document serves as continuation and continuity of care only. For full history of present illness, review of systems, and physical exam information, please see the original/previous provider note/s.History: Mariah Ferguson is a 26 y.o. female who originally reported to the emergency department with complaint of abd pain. Received as 33F 1d L adnexal pain, nausea, vomiting. All colleagues with gastroenteritis.  No vaginal or urinary symptoms. Last sexually active April 24. STD panel sent, pending. On exam TTP no rebound or guarding L adnexal region. Currently with CMT but no discharge. Transvaginal US showing pyosalpinx/hydrosalpinx. Discussed with gyn, they will review chart. Pt treated for PUD a year ago (no testing, [place],  or pelvic at that time). Pt ok to go home or be transferred to [facility] per offgoing team. No infectious s/s, no sepsis. 4:39 PM To be seen by gyn at [facility]. ED Course:See belowSigned,Natalie Reesa Chew, MD  [NN]  Comments User Index[NN] Susy Manor, MD   Clinical Impressions as of 06/24/21 0148 Left adnexal tenderness Pyosalpinx PID (pelvic inflammatory disease)  ED DispositionAdmit Amedeo Gory May, MD06/02/23 1649 Rod Holler, MDResident06/02/23 1914 Rod Holler, MDResident06/02/23 2201 Ivor Messier, MD06/03/23 [phone]

## 2021-06-24 NOTE — Progress Notes
Gynecology - Progress NotePatient Data:  Patient Name: Mariah Ferguson Age: 26 y.o. DOB: Jul 21, 1995	 MRN: [id number]	  Hospital Course: Mariah Ferguson is a 26 y.o. with no significant PMH who presents as a transfer from [facility] for LLQ pain with TVUS consistent with 7cm left pyosalpinx vs. hydrosalpinx admitted for PID/TOAHospital LOS: 1 day  Subjective: Interval:-Trich pos-Transitioned from PO doxy to IV flagyl overnight in the setting of N/VSubjective:-N/V overnight improved with zofran-No N/V this morning-Tolerating IV CTX/IV flagyl/IV doxy-Intermittent dizziness -Remains NPO-No lightheadedness/SOB/CP-No fevers or chills-Patient nervous to drink PO contrast due to fear of N/V Objective: Vitals:Last 24 hours: Temp:  [97.2 ?F (36.2 ?C)-98.5 ?F (36.9 ?C)] 98.1 ?F (36.7 ?C)Pulse:  [66-86] 85Resp:  [18-20] 18BP: (101-123)/(51-83) 101/69SpO2:  [100 %] 100 %I/O's:Gross Totals (Last 24 hours) at 06/24/2021 1014Last data filed at 06/24/2021 0513Intake 1701.33 ml Output 200 ml Net 1501.33 ml Physical Exam:Gen: NAD, lying comfortably in bedCV: RRRPulm: CTAB, normal work of breathing on RAAbd: soft, non-distended, LLQ TTP, voluntary guarding, no rebound, no CVA tenderness b/lGU: deferredExtr: WWP, no calf tenderness or edema Neuro: alert and orientedLabs:Recent Labs Lab 06/02/231304 06/03/230508 WBC 14.7* 11.1* HGB 13.8 14.0 HCT 41.60 42.50 PLT 172 172  Recent Labs Lab 06/02/231304 06/03/230508 NEUTROPHILS 90.7* 76.0*  Recent Labs Lab 06/02/231304 NA 144 K 3.3 CL 115* CO2 17* BUN 13 CREATININE 0.60 GLU 95  Recent Labs Lab 06/02/231304 CALCIUM 7.6*  Recent Labs Lab 06/02/231304 ALT 10 AST 25 ALKPHOS 53 BILITOT 0.4 BILIDIR <0.2  Recent Labs Lab 06/03/230508 PTT 26.6 LABPROT 11.7 INR 1.13  Recent Labs Lab 06/02/231304 GLU 95 Imaging:US Non-OB Transvaginal with Limited DopplerResult Date: 06/23/2021 1. No evidence of an ovarian cyst or torsion. 2. Findings suspicious for a left hydrosalpinx/pyosalpinx. Report Initiated By:  Robyn Haber, MD Reported And Signed By: Kathrin Greathouse, MD  [facility] Radiology and Biomedical Imaging  Assessment & Plan: Kashayla Ungerer is a 26 y.o. with no significant PMH who presents as a transfer from [facility] for LLQ pain with TVUS consistent with 7cm left pyosalpinx vs. hydrosalpinx now on IV CTX/doxy/flagyl remaining NPO with plan for IR drainage. She remains afebrile with improvement in WBC from 15 to 11 without N/V at this time. Plan to add reglan to antiemetic regimen. Discussed trichomonas positive result and EPT. Patient previously discussed with partner and accepts EPT. Asked gyn to additionally discuss with her partner who lives in [place]. Called partner to discuss diagnosis, treatment and importance of EPT to which he verbally agreed. Flagyl 2g x one prescribed. Patient in agreement will plan above. Heme:- No s/s active bleeding, will monitor- AM CBC 42.5- Coags wnl- VTE ppx: SCDsResp: - Stable on RA- IS Cardiac: - Monitor VSID: #C/f PID with 7cm left pyosalpinx vs. Hydrosalpinx - 6/2 presented to [facility] with n/v/LLQ - pelvic exam: + CMT + left adnexal tenderness - afebrile, WBC 15 - current: IV CTX, po doxy, IV flagyl (6/2- ) -GC/[place] neg-Trich pos[]   A/P IV contrast per IR, unlikely procedure over weekend[] IR cons[x] EPT rx; Rosary Lively, 08/20/1994, [facility] [address] [place] Neuro:- Pain currently well controlled- Tylenol, motrin, Oxy SS, dilaudid BTFEN/ GI:- NPO for IR procedure- Zofran, reglanGU:- VSPpx:- SCDs- Encourage ambulationDispo:- Pending improvement in TOA and meeting milestone__________________________Jasmin Berenice Primas, MD 6/3/20238:26 AM Best Contact: Gyn Pager [phone]

## 2021-06-24 NOTE — Utilization Review (ED)
UM Status: Meets Inpatient Statustransfer from [facility] for LLQ with new concern for PID/TOA. , Wbc 14.7, IV abx, IV zofran, pain control, GYN admit
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
## 2021-06-24 NOTE — Consults
[place] Interventional RadiologyConsultation Information Interventional Radiology consultation requested by: Ivor Messier, MDReason for consultation: Left hydrosalpinx/pyosalpinxSource of information: Patient, medical record, and consulting providerHistory of Present Illness Mariah Ferguson is a 26 y.o. female with no significant past medical history who presented to the ED with abdominal pain and nausea/vomiting. Transvaginal ultrasound showed findings of a thick-walled tubular structure in the left adnexa measuring ~7 cm concerning for hydrosalpinx/pyosalpinx. Interventional Radiology was consulted to evaluate for drainage.Past Medical History No past medical history on file. Past Surgical History No past surgical history on file. Family History No family history on file. Social History Social History Tobacco Use  Smoking status: Not on file  Smokeless tobacco: Not on file Substance Use Topics  Alcohol use: Not on file  She has no history on file for drug use.  Inpatient Medications Outpatient Medications Current Facility-Administered Medications Medication Dose Route Frequency Provider Last Rate Last Admin  [START ON 06/24/2021] cefTRIAXone (ROCEPHIN) injection 1 g  1 g Intramuscular Daily Evelene Croon, MD      [START ON 06/24/2021] doxycycline TAB/CAP 100 mg  100 mg Oral Q12H Evelene Croon, MD      metroNIDAZOLE (FLAGYL) 500 mg/100 mL IVPB 500 mg  500 mg Intravenous Q8H Evelene Croon, MD      sodium chloride 0.9 % flush 3 mL  3 mL IV Push Q8H Evelene Croon, MD      D5 1/2 NS   acetaminophen, ibuprofen, ondansetron, sodium chloride (Not in a hospital admission) Allergies No Known Allergies Objective Data Physical ExamVitals:  06/23/21 1256 06/23/21 1724 BP: (!) 110/51 105/66 Pulse: 86 72 Resp: 20 18 Temp: 97.2 ?F (36.2 ?C)  TempSrc: Oral  SpO2: 100% 100% Laboratory ResultsChemistry:Recent Labs Lab 06/02/231304 NA 144 K 3.3 CL 115* CO2 17* BUN 13 CREATININE 0.60 Complete Blood Count:Recent Labs Lab 06/02/231304 WBC 14.7* HGB 13.8 HCT 41.60 PLT 172 Liver Function Tests:Recent Labs Lab 06/02/231304 AST 25 ALT 10 ALKPHOS 53 BILITOT 0.4 Coagulation Studies:No results for input(s): PTT, LABPROT, INR in the last 168 hours.Microbiology:No results for input(s): LABBLOO, LABURIN, LOWERRESPIRA in the last 168 hours.Pertinent ImagingAssessment and Plan AssessmentRoysheka Ferguson is a 26 y.o. female with no significant past medical history who presented to the ED with abdominal pain and nausea/vomiting. Transvaginal ultrasound showed findings of a thick-walled tubular structure in the left adnexa measuring ~7 cm concerning for hydrosalpinx/pyosalpinx. Interventional Radiology was consulted to evaluate for drainage.Recommendations- Please order a [place] abdomen/pelvis with IV contrast to further characterize the ultrasound findings and to see if there is a collection amenable to drainageThank you for involving Interventional Radiology in the care of your patient. Please text or call my Mobile Heart Beat with any questions or concerns.With urgent questions or concerns, please contact IR ZO:XWRUEAVWU (pagers)- [facility]: [phone]- [facility]: [phone]- BH: [phone] (phone, all locations)- [phone] Iline Oven, MDIR Resident PGY-5

## 2021-06-25 ENCOUNTER — Encounter
Admit: 2021-06-25 | Payer: PRIVATE HEALTH INSURANCE | Attending: Student in an Organized Health Care Education/Training Program | Primary: Obstetrics and Gynecology

## 2021-06-25 LAB — CBC WITH AUTO DIFFERENTIAL
BKR WAM ABSOLUTE IMMATURE GRANULOCYTES.: 0 x 1000/ÂµL (ref 0.00–0.30)
BKR WAM ABSOLUTE LYMPHOCYTE COUNT.: 2.09 x 1000/ÂµL (ref 0.60–3.70)
BKR WAM ABSOLUTE NRBC (2 DEC): 0 x 1000/ÂµL (ref 0.00–1.00)
BKR WAM ANALYZER ANC: 2.31 x 1000/ÂµL (ref 2.00–7.60)
BKR WAM BASOPHIL ABSOLUTE COUNT.: 0.03 x 1000/ÂµL (ref 0.00–1.00)
BKR WAM BASOPHILS: 0.6 % (ref 0.0–1.4)
BKR WAM EOSINOPHIL ABSOLUTE COUNT.: 0.1 x 1000/ÂµL (ref 0.00–1.00)
BKR WAM EOSINOPHILS: 1.9 % (ref 0.0–5.0)
BKR WAM HEMATOCRIT (2 DEC): 38.8 % (ref 35.00–45.00)
BKR WAM HEMOGLOBIN: 13.3 g/dL (ref 11.7–15.5)
BKR WAM IMMATURE GRANULOCYTES: 0 % (ref 0.0–1.0)
BKR WAM LYMPHOCYTES: 39.5 % (ref 17.0–50.0)
BKR WAM MCH (PG): 32 pg (ref 27.0–33.0)
BKR WAM MCHC: 34.3 g/dL (ref 31.0–36.0)
BKR WAM MCV: 93.3 fL (ref 80.0–100.0)
BKR WAM MONOCYTE ABSOLUTE COUNT.: 0.76 x 1000/ÂµL (ref 0.00–1.00)
BKR WAM MONOCYTES: 14.4 % — ABNORMAL HIGH (ref 4.0–12.0)
BKR WAM MPV: 10.2 fL (ref 8.0–12.0)
BKR WAM NEUTROPHILS: 43.6 % (ref 39.0–72.0)
BKR WAM NUCLEATED RED BLOOD CELLS: 0 % (ref 0.0–1.0)
BKR WAM PLATELETS: 157 x1000/ÂµL (ref 150–420)
BKR WAM RDW-CV: 11.9 % (ref 11.0–15.0)
BKR WAM RED BLOOD CELL COUNT.: 4.16 M/ÂµL (ref 4.00–6.00)
BKR WAM WHITE BLOOD CELL COUNT: 5.3 x1000/ÂµL (ref 4.0–11.0)

## 2021-06-25 MED ORDER — KETOROLAC 30 MG/ML (1 ML) INJECTION SOLUTION
30 mg/mL (1 mL) | Freq: Four times a day (QID) | INTRAVENOUS | Status: CP
Start: 2021-06-25 — End: ?
  Administered 2021-06-25 – 2021-06-26 (×4): 30 mL via INTRAVENOUS

## 2021-06-25 MED ORDER — CEFTRIAXONE 1 GRAM SOLUTION FOR INJECTION
1 gram | INTRAVENOUS | Status: DC
Start: 2021-06-25 — End: 2021-06-26

## 2021-06-25 MED ORDER — DOXYCYCLINE TAB/CAP 100 MG (WRAPPED E-RX)
100 mg | Freq: Two times a day (BID) | ORAL | Status: DC
Start: 2021-06-25 — End: 2021-06-27
  Administered 2021-06-25 – 2021-06-26 (×4): 100 mg via ORAL

## 2021-06-25 MED ORDER — ACETAMINOPHEN 325 MG TABLET
325 mg | Freq: Four times a day (QID) | ORAL | Status: DC
Start: 2021-06-25 — End: 2021-06-27
  Administered 2021-06-25 – 2021-06-26 (×5): 325 mg via ORAL

## 2021-06-25 MED ORDER — METOCLOPRAMIDE 10 MG TABLET
10 mg | Freq: Four times a day (QID) | ORAL | Status: DC
Start: 2021-06-25 — End: 2021-06-25

## 2021-06-25 MED ORDER — METOCLOPRAMIDE 5 MG/ML INJECTION SOLUTION
5 mg/mL | Freq: Four times a day (QID) | INTRAVENOUS | Status: DC
Start: 2021-06-25 — End: 2021-06-26
  Administered 2021-06-25 – 2021-06-26 (×4): 5 mL via INTRAVENOUS

## 2021-06-25 NOTE — Progress Notes
[place] Interventional RadiologyBrief Note Please also refer to the prior IR consult note. Images from recent [place] of the abdomen and pelvis were reviewed that demonstrate no clearly defined drainage collection. Therefore, likely prudent to continue with antibiotics and repeat imaging can be obtained as clinically indicated. Thank you for involving Interventional Radiology in the care of your patient. Please text or call my Mobile Heart Beat with any questions or concerns.With urgent questions or concerns, please contact IR ZO:XWRUEAVWU (pagers)- [facility]: [phone]- SR[facility]: [phone]- BH: [phone] (phone, all locations)- [phone] Alyssa Grove, MD 06/24/2021

## 2021-06-25 NOTE — Plan of Care
Plan of Care Overview/ Patient Status    0700-1900Pt A&Ox4, calm. VSS on RA. Denies CP/SOB. Reports mild LLQ pain, severe at start of shift but improving with scheduled IV Toradol and prn oxycodone 5mg  . Tolerating regular diet, nausea w/o emesis controlled with scheduled Zofran and Reglan. Voids spontaneously to toilet. Lower quadrants firm/tender, last BM 6/2. SBA, using RW at times for weakness. Skin intact. L 22g patent. D5 1/2 NS at 134ml/hr. Safety maintained, call bell within reach, wctm.   Problem: Adult Inpatient Plan of CareGoal: Plan of Care ReviewOutcome: Interventions implemented as appropriate

## 2021-06-25 NOTE — Plan of Care
Plan of Care Overview/ Patient Status    Assumed care 1900-0700Pt is A&Ox4. VSS on RA. Pt denies pain at this time, nausea well controlled with scheduled zofran. NPO status continued, IVF infusing per MAR. OOB via SBA. Call bell in reach, use encouraged for assistance, Pt demonstrates proper use.2145 PRN oxycodone given for abd pain with good effect per Pt. IV abx continued per [facility]. Plan of care continued.7829 Pt assisted up to toilet via SBA, c/o nausea and lightheadedness after PO abx despite taking with scheduled zofran. No emesis episodes. Once back in bed, Pt began crying uncontrollably in pain to LLQ, 8/10. PRN dilaudid given with fair effect, report given to incoming RN. Covering provider made aware.Problem: Adult Inpatient Plan of CareGoal: Plan of Care ReviewOutcome: Interventions implemented as appropriateFlowsheets (Taken 06/24/2021 2118)Progress: no changePlan of Care Reviewed With: patient Problem: Adult Inpatient Plan of CareGoal: Optimal Comfort and WellbeingOutcome: Interventions implemented as appropriate Problem: Adult Inpatient Plan of CareGoal: Absence of Hospital-Acquired Illness or InjuryOutcome: Interventions implemented as appropriate

## 2021-06-25 NOTE — Progress Notes
Gynecology - Progress NotePatient Data:  Patient Name: Mariah Ferguson Age: 26 y.o. DOB: Jun 01, 1995	 MRN: [id number]	  Hospital Course: Mariah Ferguson is a 26 y.o. with no significant PMH who presents as a transfer from [facility] for LLQ pain with TVUS consistent with left pyosalpinx vs. hydrosalpinx admitted for PID/TOAHospital LOS: 2 days  Subjective: Interval:-Transitioned IV doxy to PO -Partner treated for EPT-CTAP without clear pelvic fluid collection-IR unable to drain at this time given absent fluid collection on CTAPSubjective:-Nausea this morning precipitated by PO doxy-Denies vomiting-Otherwise tolerating IV CTX/IV flagyl/PO doxy-No lightheadedness/dizziness/SOB/CP-Hungry and going to order breakfast, has not eaten yet -No fevers or chills-Passing flatus-Pain improved this am however after ambulation pain was severe and required dilaudid IV push Objective: Vitals:Last 24 hours: Temp:  [98 ?F (36.7 ?C)-98.6 ?F (37 ?C)] 98 ?F (36.7 ?C)Pulse:  [60-85] 71Resp:  [16-19] 18BP: (98-123)/(62-77) 98/66SpO2:  [96 %-100 %] 96 %I/O's:Gross Totals (Last 24 hours) at 06/25/2021 0232Last data filed at 06/24/2021 1803Intake 601.33 ml Output 1350 ml Net -748.67 ml Physical Exam:Gen: NAD, lying comfortably in bedCV: RRRPulm: CTAB, normal work of breathing on RAAbd: soft, non-distended, LLQ TTP, voluntary guarding, no rebound, no CVA tenderness b/lGU: deferredExtr: WWP, no calf tenderness or edema Neuro: alert and orientedLabs:Recent Labs Lab 06/02/231304 06/03/230508 WBC 14.7* 11.1* HGB 13.8 14.0 HCT 41.60 42.50 PLT 172 172  Recent Labs Lab 06/02/231304 06/03/230508 NEUTROPHILS 90.7* 76.0*  Recent Labs Lab 06/02/231304 NA 144 K 3.3 CL 115* CO2 17* BUN 13 CREATININE 0.60 GLU 95  Recent Labs Lab 06/02/231304 CALCIUM 7.6*  Recent Labs Lab 06/02/231304 ALT 10 AST 25 ALKPHOS 53 BILITOT 0.4 BILIDIR <0.2  Recent Labs Lab 06/03/230508 PTT 26.6 LABPROT 11.7 INR 1.13  Recent Labs Lab 06/02/231304 GLU 95    Imaging:[place] Abdomen Pelvis w IV & Oral Contrast (Eval for pain)Result Date: 6/3/20231. Concentric wall thickening of the urinary bladder. Correlate for any clinical or laboratory evidence of cystitis. 2. No fluid collection in the pelvis to suggest abscess. Please note that the internal pelvic organs are not well delineated on [place], if there is concern for pathology in the uterus or adnexa, continued follow-up is recommended with pelvic ultrasound. 3. No evidence of bowel obstruction, or appendicitis. Reported And Signed By: Glo Herring, MD  [facility] Radiology and Biomedical Imaging  Assessment & Plan: Mariah Ferguson is a 26 y.o. with no significant PMH who presents as a transfer from [facility] for LLQ pain with TVUS consistent with left pyosalpinx vs. hydrosalpinx now on IV CTX/IV flagyl/PO doxy without pelvic fluid collection on CTAP amenable to IR drainage. She remains afebrile with no leukocytosis, improvement in WBC from 11 to 5.3 with mild nausea in the setting of PO doxy however without emesis with zofran and reglan scheduled ATC. Episode of abdominal pain this morning precipitated by ambulation for which patient received IV dilaudid. Toradol to be scheduled ATC for 24 hours. Patient trichomonas positive s/p counseling. EPT sent s/p discussion with partner. Continue IV antibiotics today and consider transitioning to PO tomorrow morning with monitoring throughout the day to ensure patient tolerating. Heme:- No s/s active bleeding, will monitor- AM CBC 42.5- Coags wnl- VTE ppx: SCDsResp: - Stable on RA- IS Cardiac: - Monitor VSID: #C/f PID with 7cm left pyosalpinx vs. Hydrosalpinx - 6/2 presented to [facility] with n/v/LLQ - pelvic exam: + CMT + left adnexal tenderness - afebrile, WBC 15 - current: IV CTX, po doxy, IV flagyl (6/2- ) -GC/[place] neg-Trich posCT 6/3- no intraabdominal abscess, d/w rads difficult to distinguish planes -s/p IR  cons, unable to drain given absent collection on CTAP [x] EPT; Rosary Lively, 08/20/1992, [facility] [address] [place] Neuro:- Pain currently well controlled- Tylenol, toradol, motrin (held), Oxy SS, dilaudid BTFEN/ GI:- RD- Zofran, reglanGU:- VSPpx:- SCDs- Encourage ambulationDispo:- Pending improvement in TOA and meeting milestone__________________________Jasmin Berenice Primas, MD 6/4/20238:26 AM Best Contact: Gyn Pager [phone]

## 2021-06-26 DIAGNOSIS — N7011 Chronic salpingitis: Secondary | ICD-10-CM

## 2021-06-26 DIAGNOSIS — A599 Trichomoniasis, unspecified: Secondary | ICD-10-CM

## 2021-06-26 DIAGNOSIS — Z79899 Other long term (current) drug therapy: Secondary | ICD-10-CM

## 2021-06-26 DIAGNOSIS — D72829 Elevated white blood cell count, unspecified: Secondary | ICD-10-CM

## 2021-06-26 LAB — CBC WITH AUTO DIFFERENTIAL
BKR WAM ABSOLUTE IMMATURE GRANULOCYTES.: 0.03 x 1000/ÂµL (ref 0.00–0.30)
BKR WAM ABSOLUTE LYMPHOCYTE COUNT.: 2.08 x 1000/ÂµL (ref 0.60–3.70)
BKR WAM ABSOLUTE NRBC (2 DEC): 0 x 1000/ÂµL (ref 0.00–1.00)
BKR WAM ANALYZER ANC: 5.5 x 1000/ÂµL (ref 2.00–7.60)
BKR WAM BASOPHIL ABSOLUTE COUNT.: 0.02 x 1000/ÂµL (ref 0.00–1.00)
BKR WAM BASOPHILS: 0.2 % (ref 0.0–1.4)
BKR WAM EOSINOPHIL ABSOLUTE COUNT.: 0.08 x 1000/ÂµL (ref 0.00–1.00)
BKR WAM EOSINOPHILS: 0.9 % (ref 0.0–5.0)
BKR WAM HEMATOCRIT (2 DEC): 39.3 % (ref 35.00–45.00)
BKR WAM HEMOGLOBIN: 13.5 g/dL (ref 11.7–15.5)
BKR WAM IMMATURE GRANULOCYTES: 0.3 % (ref 0.0–1.0)
BKR WAM LYMPHOCYTES: 23.9 % (ref 17.0–50.0)
BKR WAM MCH (PG): 31.7 pg (ref 27.0–33.0)
BKR WAM MCHC: 34.4 g/dL (ref 31.0–36.0)
BKR WAM MCV: 92.3 fL (ref 80.0–100.0)
BKR WAM MONOCYTE ABSOLUTE COUNT.: 1 x 1000/ÂµL (ref 0.00–1.00)
BKR WAM MONOCYTES: 11.5 % (ref 4.0–12.0)
BKR WAM MPV: 10.4 fL (ref 8.0–12.0)
BKR WAM NEUTROPHILS: 63.2 % (ref 39.0–72.0)
BKR WAM NUCLEATED RED BLOOD CELLS: 0 % (ref 0.0–1.0)
BKR WAM PLATELETS: 167 x1000/ÂµL (ref 150–420)
BKR WAM RDW-CV: 11.8 % (ref 11.0–15.0)
BKR WAM RED BLOOD CELL COUNT.: 4.26 M/ÂµL (ref 4.00–6.00)
BKR WAM WHITE BLOOD CELL COUNT: 8.7 x1000/ÂµL (ref 4.0–11.0)

## 2021-06-26 MED ORDER — METRONIDAZOLE 500 MG TABLET
500 mg | ORAL_TABLET | Freq: Two times a day (BID) | ORAL | 1 refills | Status: AC
Start: 2021-06-26 — End: ?

## 2021-06-26 MED ORDER — METOCLOPRAMIDE 10 MG TABLET
10 mg | Freq: Four times a day (QID) | ORAL | Status: DC | PRN
Start: 2021-06-26 — End: 2021-06-27

## 2021-06-26 MED ORDER — DOXYCYCLINE HYCLATE 100 MG CAPSULE
100 mg | ORAL_CAPSULE | Freq: Two times a day (BID) | ORAL | 1 refills | Status: AC
Start: 2021-06-26 — End: ?

## 2021-06-26 MED ORDER — ONDANSETRON 4 MG DISINTEGRATING TABLET
4 mg | ORAL_TABLET | Freq: Four times a day (QID) | ORAL | 1 refills | Status: AC
Start: 2021-06-26 — End: ?

## 2021-06-26 MED ORDER — IBUPROFEN 600 MG TABLET
600 mg | ORAL_TABLET | Freq: Four times a day (QID) | ORAL | 1 refills | Status: AC | PRN
Start: 2021-06-26 — End: 2021-12-13

## 2021-06-26 MED ORDER — ACETAMINOPHEN 325 MG TABLET
325 mg | ORAL_TABLET | Freq: Four times a day (QID) | ORAL | 1 refills | Status: AC
Start: 2021-06-26 — End: ?

## 2021-06-26 MED ORDER — METOCLOPRAMIDE 10 MG TABLET
10 mg | ORAL_TABLET | Freq: Four times a day (QID) | ORAL | 1 refills | Status: AC | PRN
Start: 2021-06-26 — End: 2021-12-13

## 2021-06-26 MED ORDER — METRONIDAZOLE 500 MG TABLET
500 mg | Freq: Two times a day (BID) | ORAL | Status: DC
Start: 2021-06-26 — End: 2021-06-27
  Administered 2021-06-26: 17:00:00 500 mg via ORAL

## 2021-06-26 MED ORDER — OXYCODONE IMMEDIATE RELEASE 5 MG TABLET
5 mg | ORAL_TABLET | Freq: Four times a day (QID) | ORAL | 1 refills | Status: AC | PRN
Start: 2021-06-26 — End: 2021-12-13

## 2021-06-26 NOTE — Plan of Care
Plan of Care Overview/ Patient Status    Alert and Orient x4, VS stable. Bilat lung sounds clear , no c/o SOB on room air. +Bowel sounds x4, abdomen soft and non-tender, No BM this night shift. Voiding dark yellow urine. Good PO fluid intake noted, IV fluids stopped per MD. Oxycodone 5mg  PO given for c/o left lower abdomen/groin pain with good effect. Sleeping well this night shift. Walking to bath room with standby assist, gait slightly unsteady.

## 2021-06-26 NOTE — Progress Notes
Gynecology - Progress NotePatient Data:  Patient Name: Catlynn Grondahl Age: 26 y.o. DOB: November 10, 1995	 MRN: [id number]	  Hospital Course: Ruthel Martine is a 26 y.o. transferred from [facility] for PID and left pyosalpinx vs. hydrosalpinx undergoing antibiotic therapy Hospital LOS: 3 days  Subjective: Interval:-NAEON-transitioned to motrin from toradol this morning Subjective:-No pain-tolerating PO without n/v-No lightheadedness/dizziness/SOB/CP-No fevers or chills Objective: Vitals:Last 24 hours: Temp:  [97.8 ?F (36.6 ?C)-98.8 ?F (37.1 ?C)] 98.6 ?F (37 ?C)Pulse:  [65-88] 65Resp:  [18] 18BP: (96-119)/(63-76) 96/63SpO2:  [94 %-100 %] 99 %I/O's:Gross Totals (Last 24 hours) at 06/26/2021 0617Last data filed at 06/26/2021 0002Intake 1070 ml Output 375 ml Net 695 ml Physical Exam:Gen: NAD, lying comfortably in bedCV: RRRPulm: CTAB, normal work of breathing on RAAbd: soft, non-distended, LLQ slightly tender deep palpation, no guarding or rebound GU: deferredExtr: WWP, no calf tenderness or edema Neuro: alert and orientedLabs:Recent Labs Lab 06/03/230508 06/04/230548 06/05/230433 WBC 11.1* 5.3 8.7 HGB 14.0 13.3 13.5 HCT 42.50 38.80 39.30 PLT 172 157 167  Recent Labs Lab 06/03/230508 06/04/230548 06/05/230433 NEUTROPHILS 76.0* 43.6 63.2  Recent Labs Lab 06/02/231304 NA 144 K 3.3 CL 115* CO2 17* BUN 13 CREATININE 0.60 GLU 95  Recent Labs Lab 06/02/231304 CALCIUM 7.6*  Recent Labs Lab 06/02/231304 ALT 10 AST 25 ALKPHOS 53 BILITOT 0.4 BILIDIR <0.2  Recent Labs Lab 06/03/230508 PTT 26.6 LABPROT 11.7 INR 1.13  Recent Labs Lab 06/02/231304 GLU 95    Imaging:No results found. Assessment & Plan: Griselda Bramblett is a 26 y.o. with no significant PMH who presents as a transfer from [facility] for LLQ pain with TVUS consistent with left pyosalpinx vs. hydrosalpinx now undergoing antibiotic therapy for PID. [place] A/P with no drain able pelvic collection and patient remains afebrile with now normalized WBC count on CTX/doxy/flagyl. Will transition this morning to PO and monitor for discharge later today vs tomorrow. Patient found to be trichomonas positive. EPT previously sent after discussion with patient and partner. Patient aware to refrain from intercourse until 7 days after both parties complete antibiotics. Heme:- No s/s bleeding- VTE ppx: SCDsResp: - Stable on RACardiac: - Monitor VSID: #PID with 7cm left pyosalpinx vs. hydrosalpinx - 6/2 presented to [facility] with n/v/LLQ - pelvic exam: positive CMT and left adnexal tenderness - WBC 15 -> 11-> 5 6/4 -> 8.7 6/5 - GC/[place] neg, Trich pos->EPT sent after discussion with patient and partner- [place] A/P 6/3- no intraabdominal abscess, d/w rads difficult to distinguish planes - s/p IR: unable to drain given absent collection on CTAP - s/p IV CTX/flagyl, po doxy (6/2-6/5); will transition to PO doxy/flagyl and complete 2 week course Neuro:- Pain well controlled- Tylenol, motrin, Oxy SS FEN/ GI:- Regular diet- Zofran, reglanGU:- VSPpx:- SCDs- Encourage ambulationDispo:- Pending clinical stability on PO antibiotics later today vs tomorrow __________________________Bertie Epimenio Sarin OB/GYN PGY-3

## 2021-06-26 NOTE — Discharge Instructions
You were admitted for abdominal pain and found to have pelvic inflammatory disease as well as infection with a sexually transmitted infection called trichomonas. You received IV antibiotics while in the hospital. When you go home, please continue taking your antibiotics. You should take flagyl and doxycycline for 12 additional days (until you finish all of your prescribed medications). If you are unable to take the pills (from nausea), or have worsening abdominal pain, fever, chills, chest pain please give the office a call.

## 2021-06-26 NOTE — Plan of Care
Mariah Ferguson was discharged via [employer] accompanied by Friend.  Verbalized understanding of discharge instructionsand recommended follow up care as per the after visit summary.  Written discharge instructions provided. Denies any further questions. Neuro: A&Ox 4VSS on  RAPain: controlled with atc tylenol. No prn oxycodone given this shift.  Po abx. GU: Voids spontaneously. GI: LBM 6/5. Tolerating regular diet. Nausea controlled with atc nausea meds. Skin: intactLines/ Drains:  right PIV removed. Mobility:independentSafety maintained. Call bell within reach. See flowsheet for details. Work note given to pt. All belongings with pt. Vital signs    Vitals:  06/26/21 0427 06/26/21 0856 06/26/21 1245 06/26/21 1550 BP: 96/63 106/65 105/65 118/76 Pulse: 65 67 62 84 Resp: 18 16 16 18  Temp: 98.6 ?F (37 ?C) 98.7 ?F (37.1 ?C) 99 ?F (37.2 ?C) 98.9 ?F (37.2 ?C) TempSrc: Oral Oral Oral Oral SpO2: 99% 100% 95% 100% Weight:     Patient confirmed all belongings returned. Belongings charted in last 7 days: Valuable(s) : [employer] stream[employer]; Cell Phone (06/24/2021 12:09 AM) Problem: Adult Inpatient Plan of CareGoal: Plan of Care Review6/05/2021 1751 by Marga Melnick, RNOutcome: Interventions implemented as appropriate6/05/2021 1030 by Marga Melnick, RNOutcome: Interventions implemented as appropriateGoal: Patient-Specific Goal (Individualized)06/26/2021 1751 by Marga Melnick, RNOutcome: Interventions implemented as appropriate6/05/2021 1030 by Marga Melnick, RNOutcome: Interventions implemented as appropriateGoal: Absence of Hospital-Acquired Illness or Injury6/05/2021 1751 by Marga Melnick, RNOutcome: Interventions implemented as appropriate6/05/2021 1030 by Marga Melnick, RNOutcome: Interventions implemented as appropriateGoal: Optimal Comfort and Wellbeing6/05/2021 1751 by Marga Melnick, RNOutcome: Interventions implemented as appropriate6/05/2021 1030 by Marga Melnick, RNOutcome: Interventions implemented as appropriateGoal: Readiness for Transition of Care6/05/2021 1751 by Marga Melnick, RNOutcome: Interventions implemented as appropriate6/05/2021 1030 by Marga Melnick, RNOutcome: Interventions implemented as appropriate Problem: Fall Injury RiskGoal: Absence of Fall and Fall-Related Injury6/05/2021 1751 by Marga Melnick, RNOutcome: Interventions implemented as appropriate6/05/2021 1030 by Marga Melnick, RNOutcome: Interventions implemented as appropriate Plan of Care Overview/ Patient Status

## 2021-06-26 NOTE — Plan of Care
Plan of Care Overview/ Patient Status    Problem: Adult Inpatient Plan of CareGoal: Readiness for Transition of CareOutcome: Initial problem identification Per chart: 26 y.o. transferred from [facility] for PID and left pyosalpinx vs. hydrosalpinx undergoing antibiotic therapy. Chart reviewed, pt is independent with all daily care needs, no active services, no DME in use. No discharge needs identified. CM team will continue to follow pt's progress until medically cleared for d/c. Case Management Screening and Evaluation  Flowsheet Row Most Recent Value Case Management Screening: Chart review completed. If YES to any question below then proceed to CM Eval/Plan  Is there a change in their cognitive function No Do you anticipate a change in this patient's physicial function that will effect discharge needs? No Has there been a readmission within the last 30 days No Were there services prior to admission ( Examples: Assisted Living, HD, Homecare, Extended Care Facility, Methadone, SNF, Outpatient Infusion Center) No Negative/Positive Screen Negative Screening: Case Management department will follow patient's progress and discuss plan of care with treatment team. Case Manager Attestation  I have reviewed the medical record and completed the above screen. CM staff will follow patient's progress and discuss the plan of care with the Treatment Team. Yes  Elinor Parkinson, RN, MSNCare ManagerMHB: [phone]

## 2021-06-27 NOTE — Discharge Summary
[facility]	Gynecology Discharge SummaryPatient Data:  Patient Name: Mariah Ferguson Age: 26 y.o. DOB: Feb 07, 1995	 MRN: [id number]	 Admit Date: 6/2/2023Discharge Date: 06/26/21 Discharge Attending Physician: Moody Bruins, *  PCP: No Pcp, (Do Not Rename Record)Principal Diagnosis: PID (acute pelvic inflammatory disease)Other Diagnosis: trichomonas, pyosalpinx vs hydrosalpinx Discharged Condition: GoodDisposition: Home Allergies: No Known Allergies Hospital Course: 26 y.o. woman who initially presented to [facility] with LLQ pain and was transferred to [facility] for management of PID and possible L pyosalpinx. The patient presented on 6/2 with LLQ pain and n/v. Her admission gyn exam notable for +CMT as well as left adnexal tenderness with WBC 14.7. GC/[place] neg. Trich positive. After discussion with the patient, EPT was sent. Patient was instructed for her and her partner to refrain from intercourse for 7 days after completion of antibiotics.TVUS with 7 cm left hydro vs pyosalpinx. She received a [place] A/P 6/3 that did not visualize and intraabdominal/pelvic collection and thus IR was unable to perform drainage. The patient initially received IV CTX/flagyl/doxy and was transitioned to PO doxy/flagyl on 6/5 with normalization of her leukocytosis and improvement in her abdominal pain. She was discharged on 6/5 able to tolerate PO with instructions to complete 14 day course of antibiotics (to complete 6/17) Pertinent Lab Findings and Test Results:Recent Labs Lab 06/03/230508 06/04/230548 06/05/230433 WBC 11.1* 5.3 8.7 HGB 14.0 13.3 13.5 HCT 42.50 38.80 39.30 MCV 94.4 93.3 92.3 PLT 172 157 167 Recent Labs Lab 06/02/231304 NA 144 K 3.3 CL 115* CO2 17* BUN 13 CREATININE 0.60 GLU 95 ANIONGAP 12 CALCIUM 7.6* AST 25 ALT 10 ALKPHOS 53 Recent Labs Lab 06/03/230508 INR 1.13 PTT 26.6 Discharge Vitals: Blood pressure 96/63, pulse 65, temperature 98.6 ?F (37 ?C), temperature source Oral, resp. rate 18, weight 44 kg, SpO2 99 %, not currently breastfeeding.Discharge Physical Exam:Physical exam was performed on day of discharge, please see daily progress note for details. Cognitive Status at Discharge: At baseline.Pending Labs and Tests: ISSUES TO BE ADDRESSED POST DISCHARGE: 1.	Follow up at [facility] for symptom monitoring and completion of antibiotics Discharge Medications: Current Discharge Medication List  START taking these medications  Details acetaminophen (TYLENOL) 325 mg tablet Take 3 tablets (975 mg total) by mouth every 6 (six) hours for 10 days.Qty: 30 tablet, Refills: 0Start date: 06/26/2021, End date: 07/06/2021  doxycycline hyclate (VIBRAMYCIN) 100 mg capsule Take 1 capsule (100 mg total) by mouth every 12 (twelve) hours.Qty: 24 capsule, Refills: 0Start date: 06/26/2021, End date: 07/08/2021  ibuprofen (ADVIL,MOTRIN) 600 mg tablet Take 1 tablet (600 mg total) by mouth every 6 (six) hours as needed.Qty: 60 tablet, Refills: 0Start date: 06/26/2021  metoclopramide HCl (REGLAN) 10 mg tablet Take 1 tablet (10 mg total) by mouth every 6 (six) hours as needed.Qty: 120 tablet, Refills: 0Start date: 06/26/2021  metroNIDAZOLE (FLAGYL) 500 mg tablet Take 1 tablet (500 mg total) by mouth every 12 (twelve) hours.Qty: 24 tablet, Refills: 0Start date: 06/27/2021, End date: 07/09/2021  oxyCODONE (ROXICODONE) 5 mg Immediate Release tablet Take 1 tablet (5 mg total) by mouth every 6 (six) hours as needed.Qty: 8 tablet, Refills: 0Start date: 06/26/2021   CONTINUE these medications which have CHANGED  Details ondansetron (ZOFRAN-ODT) 4 mg disintegrating tablet Take 1 tablet (4 mg total) by mouth every 6 (six) hours for 7 days.Qty: 20 tablet, Refills: 0Start date: 06/26/2021, End date: 07/03/2021   CONTINUE these medications which have NOT CHANGED  Details co-enzyme Q-10 30 mg capsule Take 1 capsule (30 mg total) by mouth daily.  multivitamin tablet Take 1 tablet  by mouth daily.   .Follow-up Information:[facility] [id number]/EAV[address] [place] [phone] an appointment as soon as possible for a visitfor a follow up visit PMH PSH No past medical history on file. No past surgical history on file. Social History Family History Social History Tobacco Use ? Smoking status: Not on file ? Smokeless tobacco: Not on file Substance Use Topics ? Alcohol use: Not on file  No family history on file. Electronically Signed:Jaelee Laughter Gwyneth Revels, MD 06/26/2021 Addendum : [facility]

## 2021-09-02 ENCOUNTER — Inpatient Hospital Stay
Admit: 2021-09-02 | Discharge: 2021-09-02 | Payer: MEDICAID | Attending: Student in an Organized Health Care Education/Training Program | Primary: Obstetrics and Gynecology

## 2021-09-02 ENCOUNTER — Emergency Department: Admit: 2021-09-02 | Payer: MEDICAID | Primary: Obstetrics and Gynecology

## 2021-09-02 ENCOUNTER — Encounter: Admit: 2021-09-02 | Payer: PRIVATE HEALTH INSURANCE | Primary: Obstetrics and Gynecology

## 2021-09-02 ENCOUNTER — Ambulatory Visit: Admit: 2021-09-02 | Payer: MEDICAID | Primary: Obstetrics and Gynecology

## 2021-09-02 DIAGNOSIS — R42 Dizziness and giddiness: Secondary | ICD-10-CM

## 2021-09-02 DIAGNOSIS — Z79899 Other long term (current) drug therapy: Secondary | ICD-10-CM

## 2021-09-02 DIAGNOSIS — R079 Chest pain, unspecified: Secondary | ICD-10-CM

## 2021-09-02 DIAGNOSIS — R519 Headache, unspecified: Secondary | ICD-10-CM

## 2021-09-02 LAB — COMPREHENSIVE METABOLIC PANEL
BKR A/G RATIO: 1.5 (ref 1.0–2.2)
BKR ALANINE AMINOTRANSFERASE (ALT): 12 U/L (ref 10–35)
BKR ALBUMIN: 4.1 g/dL (ref 3.6–4.9)
BKR ALKALINE PHOSPHATASE: 61 U/L (ref 9–122)
BKR ANION GAP: 10 (ref 7–17)
BKR ASPARTATE AMINOTRANSFERASE (AST): 19 U/L (ref 10–35)
BKR AST/ALT RATIO: 1.6
BKR BILIRUBIN TOTAL: 0.2 mg/dL (ref <=1.2)
BKR BLOOD UREA NITROGEN: 9 mg/dL (ref 6–20)
BKR BUN / CREAT RATIO: 11.3 (ref 8.0–23.0)
BKR CALCIUM: 8.6 mg/dL — ABNORMAL LOW (ref 8.8–10.2)
BKR CHLORIDE: 104 mmol/L (ref 98–107)
BKR CO2: 22 mmol/L (ref 20–30)
BKR CREATININE: 0.8 mg/dL (ref 0.40–1.30)
BKR EGFR, CREATININE (CKD-EPI 2021): >60 mL/min/{1.73_m2} (ref >=60)
BKR GLOBULIN: 2.8 g/dL (ref 2.3–3.5)
BKR GLUCOSE: 153 mg/dL — ABNORMAL HIGH (ref 70–100)
BKR POTASSIUM: 3.5 mmol/L (ref 3.3–5.3)
BKR PROTEIN TOTAL: 6.9 g/dL (ref 6.6–8.7)
BKR SODIUM: 136 mmol/L (ref 136–144)

## 2021-09-02 LAB — PROTIME AND INR
BKR INR: 1.01 (ref 0.89–1.15)
BKR PROTHROMBIN TIME: 10.7 s (ref 9.5–12.1)

## 2021-09-02 LAB — CBC WITH AUTO DIFFERENTIAL
BKR WAM ABSOLUTE IMMATURE GRANULOCYTES.: 0.02 x 1000/ÂµL (ref 0.00–0.30)
BKR WAM ABSOLUTE LYMPHOCYTE COUNT.: 3 x 1000/ÂµL (ref 0.60–3.70)
BKR WAM ABSOLUTE NRBC (2 DEC): 0 x 1000/ÂµL (ref 0.00–1.00)
BKR WAM ANALYZER ANC: 4.11 x 1000/ÂµL (ref 2.00–7.60)
BKR WAM BASOPHIL ABSOLUTE COUNT.: 0.02 x 1000/ÂµL (ref 0.00–1.00)
BKR WAM BASOPHILS: 0.3 % (ref 0.0–1.4)
BKR WAM EOSINOPHIL ABSOLUTE COUNT.: 0.14 x 1000/ÂµL (ref 0.00–1.00)
BKR WAM EOSINOPHILS: 1.8 % (ref 0.0–5.0)
BKR WAM HEMATOCRIT (2 DEC): 37.4 % (ref 35.00–45.00)
BKR WAM HEMOGLOBIN: 12.9 g/dL (ref 11.7–15.5)
BKR WAM IMMATURE GRANULOCYTES: 0.3 % (ref 0.0–1.0)
BKR WAM LYMPHOCYTES: 37.9 % (ref 17.0–50.0)
BKR WAM MCH (PG): 32.1 pg (ref 27.0–33.0)
BKR WAM MCHC: 34.5 g/dL (ref 31.0–36.0)
BKR WAM MCV: 93 fL (ref 80.0–100.0)
BKR WAM MONOCYTE ABSOLUTE COUNT.: 0.63 x 1000/ÂµL (ref 0.00–1.00)
BKR WAM MONOCYTES: 8 % (ref 4.0–12.0)
BKR WAM MPV: 9.7 fL (ref 8.0–12.0)
BKR WAM NEUTROPHILS: 51.7 % (ref 39.0–72.0)
BKR WAM NUCLEATED RED BLOOD CELLS: 0 % (ref 0.0–1.0)
BKR WAM PLATELETS: 181 x1000/ÂµL (ref 150–420)
BKR WAM RDW-CV: 12.2 % (ref 11.0–15.0)
BKR WAM RED BLOOD CELL COUNT.: 4.02 M/ÂµL (ref 4.00–6.00)
BKR WAM WHITE BLOOD CELL COUNT: 7.9 x1000/ÂµL (ref 4.0–11.0)

## 2021-09-02 LAB — TROPONIN T HIGH SENSITIVITY, 1 HOUR WITH REFLEX (BH GH LMW YH)
BKR TROPONIN T HS 1 HOUR DELTA FROM 0 HOUR: 0 ng/L
BKR TROPONIN T HS 1 HOUR: <6 ng/L

## 2021-09-02 LAB — TROPONIN T HIGH SENSITIVITY, 0 HOUR BASELINE WITH REFLEX (BH GH LMW YH): BKR TROPONIN T HS 0 HOUR BASELINE: <6 ng/L

## 2021-09-02 MED ORDER — SODIUM CHLORIDE 0.9 % BOLUS (NEW BAG)
0.9 % | Freq: Once | INTRAVENOUS | Status: CP
Start: 2021-09-02 — End: ?
  Administered 2021-09-02: 09:00:00 0.9 mL/h via INTRAVENOUS

## 2021-09-02 MED ORDER — IOHEXOL 350 MG IODINE/ML INTRAVENOUS SOLUTION
350 mg iodine/mL | Freq: Once | INTRAVENOUS | Status: CP | PRN
Start: 2021-09-02 — End: ?
  Administered 2021-09-02: 11:00:00 350 mL via INTRAVENOUS

## 2021-09-02 MED ORDER — MECLIZINE 12.5 MG TABLET
12.5 mg | Freq: Once | ORAL | Status: CP
Start: 2021-09-02 — End: ?
  Administered 2021-09-02: 09:00:00 12.5 mg via ORAL

## 2021-09-02 MED ORDER — MECLIZINE 25 MG TABLET
25 mg | ORAL_TABLET | Freq: Three times a day (TID) | ORAL | 1 refills | Status: AC | PRN
Start: 2021-09-02 — End: 2021-12-13

## 2021-09-02 MED ORDER — DIAZEPAM 2 MG TABLET
2 mg | Freq: Once | ORAL | Status: CP
Start: 2021-09-02 — End: ?
  Administered 2021-09-02: 12:00:00 2 mg via ORAL

## 2021-09-02 MED ORDER — SODIUM CHLORIDE 0.9 % LARGE VOLUME SYRINGE FOR AUTOINJECTOR
Freq: Once | INTRAVENOUS | Status: CP | PRN
Start: 2021-09-02 — End: ?
  Administered 2021-09-02: 11:00:00 via INTRAVENOUS

## 2021-09-02 NOTE — Discharge Instructions
You were evaluated today for dizziness.  Workup in the ER shows no stroke, bleed or tumor that would explain the cause of her dizziness.  This is likely secondary to an inner ear problem.  You were provided with an ENT appointment, please call to make an appointment.  You were also being discharged with meclizine for your symptoms.  Take meclizine as needed whenever for symptoms.

## 2021-09-02 NOTE — ED Notes
12:56 PM Pt able to ambulate out of room and in hallway with this RNStates she feels much better

## 2021-09-02 NOTE — ED Notes
3:22 AM Pt BIBA for chest pain starting 2 AM. Pt reports feeling hot and endorses dizziness. Pt reports not being able to walk due to dizziness. Pt EMS Pt was carried out of her house. EKG initiated. Labs drawn, PIV placed. Pt a+ox4. RR even and unlabored. Adequate chest rise and fall. No acute distress noted. 4:44 AM Labs collected. Pt medicated per [facility]. 6:49 AMPt to [place] scan. 7:03 AMReport provided to oncoming RN. Care transferred at this time.

## 2021-09-02 NOTE — Other
[facility] ED Observation Progress NoteAdmit to Observation Status: Obs date: 09/02/2021  8:46 AMCondition:ImprovedVital signs: Normal  Yes BP 93/66  - Pulse 65  - Temp 97.8 ?F (36.6 ?C) (Oral)  - Resp 18  - Wt 44 kg  - SpO2 99%  - BMI 18.94 kg/m? Vital signs reviewed per nursing notesGeneral:  Distress: Appears well, no distressHeart:  normal rateLungs:  Clear to auscultation bilaterally.  Normal respiratory effortNeuro: at baselineAbdomen: Soft, nontender YesSkin: Warm and well perfused YesResponse to management: ImprovedObservation course: Patient's MRI reassuring. Symptoms have completed resolved. Plan for dc w/meclizine and ENT fup for likely peripheral vertigo. Strict return precautions provided  Bunnie Domino, MD08/12/23 1315

## 2021-09-04 NOTE — ED Provider Notes
Chief Complaint Patient presents with ? Chest Pain   Pt to ED for eval of chest pain that started tonight when she was getting herself something to eat. She said she also felt like she was going to pass out and felt warm all over, BG 102, denies ETOH or drug use, arrives alert and oriented x 4 HPI/PE:ADMISSION TO ED OBSERVATIONAdmit to Observation Status: August 12th, 2023 at 8:46 AM 44 kgIndication for observation: Diagnostic testing (MRI, 6hr [place] etc)In addition to serial exams and monitoring of hemodynamics, other anticipated interventions include: symptomatic treatment Relevant home medications to be ordered by me and patient handoff given per protocol Discharge criteria: MRI vertigo, if positive, talk to Neurology. If negative and still symptomatic, talk to Neurology An acute or life threatening problem was considered during this evaluation  A decision regarding hospitalization was made during this visit  Care limited by SDOH: Ambulatory Care Access.  Physical ExamED Triage Vitals [09/02/21 0318]BP: 116/68Pulse: 70Pulse from  O2 sat: n/aResp: 18Temp: 98.3 ?F (36.8 ?C)Temp src: OralSpO2: 99 % BP 93/66  - Pulse 65  - Temp 97.8 ?F (36.6 ?C) (Oral)  - Resp 18  - Wt 44 kg  - SpO2 99%  - BMI 18.94 kg/m? Physical Exam ProceduresAttestation/Critical CarePatient Reevaluation: 26 yo presents w vertigo. CC states CP however pt's primary issue is HA that began 1 hr PTP w/ vertigo. States she had brief chest tightness at home over concern for her HA/vertigo that has since resolved. Has had a URi for the past week w cold sx's. No SOB, extremity weakness, ear pain. Exam reassuring, horizontal nystagmus, no vertical, no focal deficits or CN deficits, RRR clear lungs. Suspect peripheral vertigo 2/2 vestibular labrythitis given hx of URI. Do not suspect CVA as young/healthy and reassuring neuro exam. In terms of HA, this is atypical for her. Will [place] head to r/o SAH, VST. Sx's sig improved w meclizine. Update: 7:53 AMNo improvement in symptoms with meclizine her fluids, workup reassuring, will trial Valium and will place for MRI in case she does not improve.Comments as of 09/04/21 0021 Sat Sep 02, 2021 8119 Sign-out received from Dr. Kathaleen Bury at 7:00 JY:NWGNFAO is a 26-yo female p/w vertigo, CTH and CTV w/no acute abnormalities, persistently dizzy, plan for MRI vertigo protocol and repeat meclizine  [SP] 1314 Patient's MRI reassuring. Symptoms have completed resolved. Plan for dc w/meclizine and ENT fup for likely peripheral vertigo. Strict return precautions provided  [SP]  Comments User Index[SP] Pavuluri, Georgeanne Nim, MD   Clinical Impressions as of 09/04/21 0021 Vertigo  ED DispositionDischarge Mercy Riding, MD08/12/23 0754 Ricky Stabs Georgeanne Nim, MD08/12/23 0846 Bunnie Domino, MD08/12/23 1315 Mercy Riding, MD08/14/23 0021

## 2021-09-14 ENCOUNTER — Ambulatory Visit: Admit: 2021-09-14 | Payer: MEDICAID | Attending: Obstetrics and Gynecology | Primary: Obstetrics and Gynecology

## 2021-09-14 DIAGNOSIS — N730 Acute parametritis and pelvic cellulitis: Secondary | ICD-10-CM

## 2021-09-14 DIAGNOSIS — Z01419 Encounter for gynecological examination (general) (routine) without abnormal findings: Secondary | ICD-10-CM

## 2021-09-14 DIAGNOSIS — Z113 Encounter for screening for infections with a predominantly sexual mode of transmission: Secondary | ICD-10-CM

## 2021-09-14 DIAGNOSIS — Z1331 Encounter for screening for depression: Secondary | ICD-10-CM

## 2021-09-14 MED ORDER — DOXYCYCLINE HYCLATE 100 MG CAPSULE
100 mg | ORAL_CAPSULE | Freq: Two times a day (BID) | ORAL | 1 refills | Status: AC
Start: 2021-09-14 — End: ?

## 2021-09-14 MED ORDER — METRONIDAZOLE 500 MG TABLET
500 mg | ORAL_TABLET | ORAL | 1 refills | Status: AC
Start: 2021-09-14 — End: ?

## 2021-09-15 NOTE — Progress Notes
[facility]8/24/2023GYN Annual ExamVitals:  09/14/21 1138 BP: 100/60 Weight: 45.4 kg  Weight 100 lbs.Body mass index is 19.53 kg/m?Marland KitchenNo obstetric history on file.	Patient's last menstrual period was 09/03/2021 (exact date).Chief Concern:  Mariah Ferguson is a 26 y.o. year-old female here today to establish GYN care and for her yearly preventive care and exam. She has recently moved here from [place]. 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 Has a long distance female partner. Gyn Review of Systems: no breast, bladder, or pelvic concerns. Does note she used a new soap and has a burning sensation inside her vagina. She also has a history of PID with positive trichomonas testing in June (treated inpatient at [facility]) and is wondering if that could have come back.History unchanged from previous visit except: all history updated in Epic Pregnancy planning/prevention: none, declines d/t rare sexual activity with long-distance partnerPERSONAL LIFEWork: 			CNA			Partner/Family:  	Lives with her cousinMenstrual: 		Regular	Sexually active? 	Yes, boyfriend lives in [place] so not often	Contraception: 	NoRisk for STIs:		No, but would like testing todayHEALTH MAINTENANCE	Gardasil Vaccine:  	yes.		Hereditary Cancer Testing: not indicatedLast Pap: 		It's been yearsLast Mammo: 		n/aColonoscopy: 		n/aBone Density: 		n/aCholesterol: 		Overdue - would like ordered today  LIFESTYLE                                                                                              Kegels			NoNutrition		YesExercise		YesSubstances		Yes smokes marijuana and drinks sociallySAFETY       Seatbelt use		YesSmoke/CO		YesFirearms		No Safe @home Mariah Ferguson	Feels safePatient Health Questionnaire-2/9: Screening Instrument for DepressionOver the past 2 weeks, how often have you been bothered by any of the following problems? Little interest or pleasure in doing things: Not at allFeeling down, depressed, or hopeless: Not at allPHQ-2 Total Score: 0PHQ-9 Total Score: 0 PHQ-2 results reviewed by CNM: 0, no mood concerns todayPHYSICAL EXAM:General: Appears well, well developed, well nourished and in no acute distressThyroid: Soft, smooth, symmetrical, and non-tenderHeart: Regular rate & rhythm, no murmurs auscultatedLungs: Clear to auscultation bilaterally, no wheezes, rales or rhonchi, symmetric air entryBreast Exam: Normal bilaterally, no palpable masses or discharge.  Non-tender, no adenopathy.Normal skin textureBilateral nipple piercingsAbdomen: Soft, no tenderness suprapubic or on L side but R side is tender to palpationExternal Genitalia: Normal external female genitaliaNo lesionsNo erythema or rashVagina: Rugaeted, normal mucosa with normal leukorrhea, no lesionsCervix: Normal structureNo dischargeNo lesionsPositive cervical motion tendernessUterus: Non-tender, normal size, smooth contours			  Adnexa: No palpable masses bilaterallyRight adnexa notable for TENDERNESSLeft adnexa nontenderRectal: No external lesions, internal exam deferredPatient exam or treatment required medical chaperone.The sensitive parts of the examination were performed with chaperone present: YesChaperone Name, Role/Title: Mariah Sheldon Heins RNIMPRESSIONNew patient GYN examRecurrence of PIDNegative depression screenPLANPap + HPV obtainedGC/[place]/trich off urineCeftriaxone 500mg  reconstituted in 1.57mL Lidocaine injected into R buttock, pt tolerated wellDoxycycline 100mg  po BID x10 Rx'dMetronidazole 500mg  po BID x10d Rx'dLabs ordered: CBC, CMP, TSH, A1c, fasting lipid panelF/u once labs are resulted and then 2w in office for reeval of resolution of painStrict calling guidelines for worsening sx in the interimRTO yearlyAshley Heins SNP participated in this visitHallie E Willard Madrigal, CNM8/24/2023Patient Active Problem List  Diagnosis  ? PID (acute pelvic inflammatory disease)  ? Left adnexal tenderness  ALLERGIES: She  has No Known Allergies. PAST MEDICAL HISTORY: She  has no past medical history on file. PAST SURGICAL HISTORY: She  has no past surgical history on file. PAST OBSTETRICAL HISTORY: OB History  No obstetric history on file. FAMILY HISTORY: Her family history is not on file. SOCIAL HISTORY: She   MEDICATIONS: She has a current medication list which includes the following prescription(s): co-enzyme Q-10 30 mg capsule - Take 1 capsule (30 mg total) by mouth daily (Patient not taking: Reported on 09/14/2021), doxycycline hyclate (VIBRAMYCIN) 100 mg capsule - Take 1 capsule (100 mg total) by mouth 2 (two) times daily for 10 days, ibuprofen (ADVIL,MOTRIN) 600 mg tablet - Take 1 tablet (600 mg total) by mouth every 6 (six) hours as needed (Patient not taking: Reported on 09/14/2021), meclizine (ANTIVERT) 25 mg tablet - Take 1 tablet (25 mg total) by mouth 3 (three) times daily as needed (Patient not taking: Reported on 09/14/2021), metoclopramide HCl (REGLAN) 10 mg tablet - Take 1 tablet (10 mg total) by mouth every 6 (six) hours as needed (Patient not taking: Reported on 09/14/2021), metroNIDAZOLE (FLAGYL) 500 mg tablet - Take 1 tablet (500 mg total) by mouth Before Breakfast & Nightly for 10 days, multivitamin tablet - Take 1 tablet by mouth daily (Patient not taking: Reported on 09/14/2021), and oxyCODONE (ROXICODONE) 5 mg Immediate Release tablet - Take 1 tablet (5 mg total) by mouth every 6 (six) hours as needed (Patient not taking: Reported on 09/14/2021).

## 2021-09-16 LAB — SURESWAB ?? TRICHOMONAS VAGINALIS RNA, QL, TMA     (Q): SURESWAB TRICHOMONAS VAGINALIS RNA, QL TMA: NOT DETECTED

## 2021-09-19 NOTE — Other
Pt informed of nl pap via MyChart.

## 2021-09-25 ENCOUNTER — Telehealth
Admit: 2021-09-25 | Payer: PRIVATE HEALTH INSURANCE | Attending: Obstetrics and Gynecology | Primary: Obstetrics and Gynecology

## 2021-09-25 ENCOUNTER — Encounter
Admit: 2021-09-25 | Payer: PRIVATE HEALTH INSURANCE | Attending: Obstetrics and Gynecology | Primary: Obstetrics and Gynecology

## 2021-09-25 MED ORDER — FLUCONAZOLE 150 MG TABLET
150 mg | ORAL_TABLET | Freq: Once | ORAL | 1 refills | Status: AC
Start: 2021-09-25 — End: ?

## 2021-09-25 NOTE — Telephone Encounter
Spoke to [employer]. She was seen 8/24 and treated for presumptive PID given her hx of the same (r/t trich in that instance). She was given ceftriaxone in the office and Rx'd Doxycycline and Flagyl. She started taking them 8/31 and states when she takes the 2nd dose of her abx, she vomits. Thus, she has been only taking ~50% of her doses. States she has no sx aside from slight irritation when she washes (fungal organisms were noted on pap). Testing was negative for trich, GC and [place]. Reviewed that given negative testing and no sx, ok to stop abx. Reviewed etiology and sx of PID. Will Rx Diflucan given likely yeast and message office to schedule follow up appt in 1-2 weeks.Freddy Jaksch, CNM9/04/2021

## 2021-11-22 ENCOUNTER — Ambulatory Visit: Admit: 2021-11-22 | Payer: MEDICAID | Attending: Neuroradiology | Primary: Obstetrics and Gynecology

## 2021-12-13 ENCOUNTER — Encounter: Admit: 2021-12-13 | Payer: MEDICAID | Attending: Obstetrics and Gynecology | Primary: Obstetrics and Gynecology

## 2021-12-13 ENCOUNTER — Inpatient Hospital Stay: Admit: 2021-12-13 | Discharge: 2021-12-13 | Payer: MEDICAID | Primary: Obstetrics and Gynecology

## 2021-12-13 DIAGNOSIS — N7011 Chronic salpingitis: Secondary | ICD-10-CM

## 2021-12-13 DIAGNOSIS — R102 Pelvic and perineal pain: Secondary | ICD-10-CM

## 2021-12-13 DIAGNOSIS — K5900 Constipation, unspecified: Secondary | ICD-10-CM

## 2021-12-13 DIAGNOSIS — Z113 Encounter for screening for infections with a predominantly sexual mode of transmission: Secondary | ICD-10-CM

## 2021-12-13 DIAGNOSIS — Z3202 Encounter for pregnancy test, result negative: Secondary | ICD-10-CM

## 2021-12-13 DIAGNOSIS — N730 Acute parametritis and pelvic cellulitis: Secondary | ICD-10-CM

## 2021-12-13 DIAGNOSIS — N912 Amenorrhea, unspecified: Secondary | ICD-10-CM

## 2021-12-13 NOTE — Progress Notes
[facility]wivesProblem VisitDate: 12/13/2021 				Patient's last menstrual period was 10/18/2021.:	Vitals:  12/13/21 1121 BP: 102/60 Weight: 42.2 kg Weight 93 lbs.Chief Concern:Mariah Ferguson is 26 y.o.  presenting for late period and left adnexal tenderness, which has been a long standing issue for her. Hx PID x 1 (trich noted), and presumptive PID, where no organisms of concern were identified. TVUS 06/2021 were concerning for left hydrosalpinx.Note throbbing left lower abdomen when she walks, comes and goes, happens most days on and off, only when she's walking. Lasts 2 hours then resolves. Makes her want to sit down. Started about 2 weeks ago. Notes constipation issues. Has a BM twice a week. Period is 21 days late. Was supposed to come 10/31. hcg at home and here are negative.Has not taken any medications for pain or constipation - does not like to take meds.Contributing History:Menstrual hx:  Regular Sexual activity / Partners: No currently, last >1 month agoContraception: NoGeneral: Appears well, well developed, well nourished and in no acute distressExternal Genitalia: Normal external female genitaliaNo lesionsNo erythema or rashVagina: Rugaeted, normal mucosa with normal leukorrhea, no lesionsCervix: Normal structureNo dischargeNo lesionsNo cervical motion tendernessUterus: Non-tender, normal size, smooth contours			  Adnexa: Non-tender on right, mild tenderness on leftNo palpable masses bilaterallyRectal: No external lesions, internal exam deferredPatient exam or treatment required medical chaperone.The sensitive parts of the examination were performed with chaperone present: YesChaperone Name, Role/Title: Mariah Ferguson, Engineer, site. Assessment:Intermittent left adnexal tendernessEvidence of hydrosalpinx on [place] 6/2023Hx PID (trich)Amenorrhea, period 21 days late, hcg negPlan:                                                                                                    TVUS orderedAptima vaginitis plus ob obtainedLabs: STD panel, TSH, prolactinAdvised fiber at a minimum for regularly, increasing fiber rich foods and water. Reviewed other medications that might be helpful, but she prefers to defer that for nowIbuprofen and heat for painWill follow up after [place] or PRNRTO annual and PRNKristin Benjaman Kindler, CNM11/22/2023Patient Active Problem List  Diagnosis  ? PID (acute pelvic inflammatory disease)  ? Left adnexal tenderness  ALLERGIES: She has No Known Allergies. PAST MEDICAL HISTORY: She  has no past medical history on file. PAST SURGICAL HISTORY: She  has no past surgical history on file. PAST OBSTETRICAL HISTORY: OB History  No obstetric history on file. FAMILY HISTORY: Her family history is not on file. SOCIAL HISTORY: She   MEDICATIONS: She currently has no medications in their medication list.

## 2021-12-14 LAB — URINALYSIS-MACROSCOPIC W/REFLEX MICROSCOPIC
BKR BILIRUBIN, UA: NEGATIVE
BKR BLOOD, UA: NEGATIVE
BKR GLUCOSE, UA: NEGATIVE
BKR KETONES, UA: NEGATIVE
BKR LEUKOCYTE ESTERASE, UA: NEGATIVE
BKR NITRITE, UA: NEGATIVE
BKR PH, UA: 7 (ref 5.5–7.5)
BKR PROTEIN, UA: NEGATIVE
BKR SPECIFIC GRAVITY, UA: 1.021 (ref 1.005–1.030)
BKR UROBILINOGEN, UA (MG/DL): 2 mg/dL (ref <=2.0)

## 2021-12-14 LAB — URINE CULTURE
BKR URINE CULTURE, ROUTINE: NO GROWTH
TRICHOMONAS VAGINALIS (TV), TMA: NO GROWTH

## 2021-12-15 ENCOUNTER — Encounter: Admit: 2021-12-15 | Payer: PRIVATE HEALTH INSURANCE | Primary: Obstetrics and Gynecology

## 2021-12-15 ENCOUNTER — Telehealth: Admit: 2021-12-15 | Payer: PRIVATE HEALTH INSURANCE | Primary: Obstetrics and Gynecology

## 2021-12-15 DIAGNOSIS — N760 Acute vaginitis: Secondary | ICD-10-CM

## 2021-12-15 LAB — SURESWAB ADVANCED VAGINITIS PLUS, TMA (ORANGE TUBE)   (Q)
C. TRACHOMATIS RNA, TMA: NOT DETECTED
CANDIDA GLABRATA: NOT DETECTED
CANDIDA SPECIES: NOT DETECTED
NEISSERIA GONORRHOEAE RNA, TMA: NOT DETECTED
SURESWAB(R) ADV BACTERIAL VAGINOSIS (BV), TMA: POSITIVE — AB

## 2021-12-15 MED ORDER — METRONIDAZOLE 0.75 % (37.5 MG/5 GRAM) VAGINAL GEL
0.75 % (37.5mg/5 gram) | Freq: Every evening | VAGINAL | 1 refills | Status: AC
Start: 2021-12-15 — End: ?

## 2021-12-15 NOTE — Telephone Encounter
TC to [employer]  To review +BV results. Phone disconnects call. Tried on multiple lines. Msg sent to her mychart account.Reviewed allergies, NKDA.  Metrogel rx ordered to desired pharmacy.Roe Rutherford, RN11/24/202311:17 AM

## 2021-12-19 ENCOUNTER — Encounter: Admit: 2021-12-19 | Payer: PRIVATE HEALTH INSURANCE | Primary: Obstetrics and Gynecology

## 2021-12-19 ENCOUNTER — Telehealth
Admit: 2021-12-19 | Payer: PRIVATE HEALTH INSURANCE | Attending: Obstetrics and Gynecology | Primary: Obstetrics and Gynecology

## 2021-12-19 NOTE — Telephone Encounter
Anna Tobi Bastosalled from [place] and Riccio's office to inform us that they called [place] several times and goes right to voice mail that is not set up. Tried to call patient my self and got same response unable to leave voicemail. Sent my chart message.

## 2021-12-21 ENCOUNTER — Telehealth
Admit: 2021-12-21 | Payer: PRIVATE HEALTH INSURANCE | Attending: Obstetrics and Gynecology | Primary: Obstetrics and Gynecology

## 2021-12-21 ENCOUNTER — Encounter: Admit: 2021-12-21 | Payer: PRIVATE HEALTH INSURANCE | Primary: Obstetrics and Gynecology

## 2021-12-21 NOTE — Telephone Encounter
TC attempted to [facility].  Unable to connect with [place] with number listed in chart.  F/u mychart message sent. Ileene Musa, RN11/30/20232:09 PM

## 2022-01-03 ENCOUNTER — Encounter: Admit: 2022-01-03 | Payer: PRIVATE HEALTH INSURANCE | Primary: Obstetrics and Gynecology

## 2022-01-03 ENCOUNTER — Telehealth
Admit: 2022-01-03 | Payer: PRIVATE HEALTH INSURANCE | Attending: Obstetrics and Gynecology | Primary: Obstetrics and Gynecology

## 2022-01-03 MED ORDER — METRONIDAZOLE 0.75 % (37.5 MG/5 GRAM) VAGINAL GEL
0.75 % (37.5mg/5 gram) | Freq: Every evening | VAGINAL | 1 refills | Status: AC
Start: 2022-01-03 — End: ?

## 2022-01-03 NOTE — Telephone Encounter
TC placed to [facility].  Reviewed that she has a tele health scheduled at 2pm with Geraldo Docker and to call office a few minutes before to check in for link.  Reviewed that Geraldo Docker is on call CNM tomorrow and appointment may need to be changed. Ileene Musa, RN12/13/20235:11 PM

## 2022-01-03 NOTE — Telephone Encounter
Pt calling requesting Rx for Metrogel for previous sx, pt states she is still experiencing same sx, pt also would like to change her pharmacy. Pharmacy updated and changed.

## 2022-01-03 NOTE — Telephone Encounter
TC returned to Mariah Ferguson re: metrogel prescription and confirming pharmacy. There was an issue with obtaining medication from listed pharmacy and has not received treatment since her last appointment 11/22.   Metrogel ordered to desired pharmacy.  Reviewed to use before bed for 5 days and to call if symptoms persist after finishing metrogel.  Mariah Ferguson was able to go for [place] around 12/8.  She is awaiting results.  Mariah Ferguson, RN12/13/20231:03 PM

## 2022-01-04 ENCOUNTER — Ambulatory Visit: Admit: 2022-01-04 | Payer: MEDICAID | Attending: Obstetrics and Gynecology | Primary: Obstetrics and Gynecology

## 2022-01-04 ENCOUNTER — Encounter: Admit: 2022-01-04 | Payer: PRIVATE HEALTH INSURANCE | Primary: Obstetrics and Gynecology

## 2022-01-04 DIAGNOSIS — N7011 Chronic salpingitis: Secondary | ICD-10-CM

## 2022-01-04 DIAGNOSIS — K5900 Constipation, unspecified: Secondary | ICD-10-CM

## 2022-01-04 DIAGNOSIS — N911 Secondary amenorrhea: Secondary | ICD-10-CM

## 2022-01-04 DIAGNOSIS — E282 Polycystic ovarian syndrome: Secondary | ICD-10-CM

## 2022-01-09 ENCOUNTER — Encounter: Admit: 2022-01-09 | Payer: PRIVATE HEALTH INSURANCE | Primary: Obstetrics and Gynecology

## 2022-01-17 ENCOUNTER — Encounter: Admit: 2022-01-17 | Payer: PRIVATE HEALTH INSURANCE | Attending: Emergency Medicine | Primary: Obstetrics and Gynecology

## 2022-01-17 ENCOUNTER — Encounter: Admit: 2022-01-17 | Payer: PRIVATE HEALTH INSURANCE | Primary: Obstetrics and Gynecology

## 2022-01-17 ENCOUNTER — Telehealth
Admit: 2022-01-17 | Payer: PRIVATE HEALTH INSURANCE | Attending: Obstetrics and Gynecology | Primary: Obstetrics and Gynecology

## 2022-01-17 ENCOUNTER — Inpatient Hospital Stay: Admit: 2022-01-17 | Discharge: 2022-01-17 | Payer: MEDICAID | Attending: Emergency Medicine

## 2022-01-17 ENCOUNTER — Encounter: Admit: 2022-01-17 | Payer: MEDICAID | Attending: Obstetrics and Gynecology | Primary: Obstetrics and Gynecology

## 2022-01-17 ENCOUNTER — Encounter
Admit: 2022-01-17 | Payer: PRIVATE HEALTH INSURANCE | Attending: Obstetrics and Gynecology | Primary: Obstetrics and Gynecology

## 2022-01-17 ENCOUNTER — Emergency Department: Admit: 2022-01-17 | Payer: MEDICAID | Primary: Obstetrics and Gynecology

## 2022-01-17 DIAGNOSIS — N912 Amenorrhea, unspecified: Secondary | ICD-10-CM

## 2022-01-17 DIAGNOSIS — R1032 Left lower quadrant pain: Secondary | ICD-10-CM

## 2022-01-17 DIAGNOSIS — E282 Polycystic ovarian syndrome: Secondary | ICD-10-CM

## 2022-01-17 DIAGNOSIS — N83201 Unspecified ovarian cyst, right side: Secondary | ICD-10-CM

## 2022-01-17 DIAGNOSIS — R3989 Other symptoms and signs involving the genitourinary system: Secondary | ICD-10-CM

## 2022-01-17 DIAGNOSIS — N7011 Chronic salpingitis: Secondary | ICD-10-CM

## 2022-01-17 DIAGNOSIS — N83202 Unspecified ovarian cyst, left side: Secondary | ICD-10-CM

## 2022-01-17 DIAGNOSIS — R102 Pelvic and perineal pain: Secondary | ICD-10-CM

## 2022-01-17 DIAGNOSIS — N83209 Unspecified ovarian cyst, unspecified side: Secondary | ICD-10-CM

## 2022-01-17 LAB — CHLAMYDIA TRACHOMATIS, NAAT (LAB ORDER ONLY) (BH GH L LMW YH): BKR CHLAMYDIA, DNA PROBE: NEGATIVE

## 2022-01-17 LAB — LIPASE: BKR LIPASE: 32 U/L (ref 11–55)

## 2022-01-17 LAB — CBC WITH AUTO DIFFERENTIAL
BKR WAM ABSOLUTE IMMATURE GRANULOCYTES.: 0.01 x 1000/ÂµL (ref 0.00–0.30)
BKR WAM ABSOLUTE LYMPHOCYTE COUNT.: 3.18 x 1000/ÂµL (ref 0.60–3.70)
BKR WAM ABSOLUTE NRBC (2 DEC): 0 x 1000/ÂµL (ref 0.00–1.00)
BKR WAM ANALYZER ANC: 4.84 x 1000/ÂµL (ref 2.00–7.60)
BKR WAM BASOPHIL ABSOLUTE COUNT.: 0.02 x 1000/ÂµL (ref 0.00–1.00)
BKR WAM BASOPHILS: 0.2 % (ref 0.0–1.4)
BKR WAM EOSINOPHIL ABSOLUTE COUNT.: 0.07 x 1000/ÂµL (ref 0.00–1.00)
BKR WAM EOSINOPHILS: 0.8 % (ref 0.0–5.0)
BKR WAM HEMATOCRIT (2 DEC): 38.2 % (ref 35.00–45.00)
BKR WAM HEMOGLOBIN: 13.1 g/dL (ref 11.7–15.5)
BKR WAM IMMATURE GRANULOCYTES: 0.1 % (ref 0.0–1.0)
BKR WAM LYMPHOCYTES: 35.7 % (ref 17.0–50.0)
BKR WAM MCH (PG): 31.1 pg (ref 27.0–33.0)
BKR WAM MCHC: 34.3 g/dL (ref 31.0–36.0)
BKR WAM MCV: 90.7 fL (ref 80.0–100.0)
BKR WAM MONOCYTE ABSOLUTE COUNT.: 0.79 x 1000/ÂµL (ref 0.00–1.00)
BKR WAM MONOCYTES: 8.9 % (ref 4.0–12.0)
BKR WAM MPV: 10.1 fL (ref 8.0–12.0)
BKR WAM NEUTROPHILS: 54.3 % (ref 39.0–72.0)
BKR WAM NUCLEATED RED BLOOD CELLS: 0 % (ref 0.0–1.0)
BKR WAM PLATELETS: 187 x1000/ÂµL (ref 150–420)
BKR WAM RDW-CV: 12.4 % (ref 11.0–15.0)
BKR WAM RED BLOOD CELL COUNT.: 4.21 M/ÂµL (ref 4.00–6.00)
BKR WAM WHITE BLOOD CELL COUNT: 8.9 x1000/ÂµL (ref 4.0–11.0)

## 2022-01-17 LAB — URINALYSIS WITH CULTURE REFLEX      (BH LMW YH)
BKR BILIRUBIN, UA: NEGATIVE
BKR BLOOD, UA: NEGATIVE
BKR GLUCOSE, UA: NEGATIVE
BKR LEUKOCYTE ESTERASE, UA: NEGATIVE
BKR NITRITE, UA: NEGATIVE
BKR PH, UA: 6 (ref 5.5–7.5)
BKR PROTEIN, UA: NEGATIVE
BKR SPECIFIC GRAVITY, UA: 1.018 (ref 1.005–1.030)
BKR UROBILINOGEN, UA (MG/DL): <2 mg/dL (ref <=2.0)

## 2022-01-17 LAB — NEISSERIA GONORRHEA, NAAT (LAB ORDER ONLY)   (BH GH L LMW YH): BKR NEISSERIA GONORRHOEAE, DNA PROBE: NEGATIVE

## 2022-01-17 LAB — BASIC METABOLIC PANEL
BKR ANION GAP: 10 (ref 7–17)
BKR BLOOD UREA NITROGEN: 10 mg/dL (ref 6–20)
BKR BUN / CREAT RATIO: 12.5 (ref 8.0–23.0)
BKR CALCIUM: 8.8 mg/dL (ref 8.8–10.2)
BKR CHLORIDE: 106 mmol/L (ref 98–107)
BKR CO2: 23 mmol/L (ref 20–30)
BKR CREATININE: 0.8 mg/dL (ref 0.40–1.30)
BKR EGFR, CREATININE (CKD-EPI 2021): >60 mL/min/{1.73_m2} (ref >=60)
BKR GLUCOSE: 86 mg/dL (ref 70–100)
BKR POTASSIUM: 3.8 mmol/L (ref 3.3–5.3)
BKR SODIUM: 139 mmol/L (ref 136–144)

## 2022-01-17 LAB — HEPATIC FUNCTION PANEL
BKR A/G RATIO: 1.6 (ref 1.0–2.2)
BKR ALANINE AMINOTRANSFERASE (ALT): 11 U/L (ref 10–35)
BKR ALBUMIN: 4.1 g/dL (ref 3.6–4.9)
BKR ALKALINE PHOSPHATASE: 61 U/L (ref 9–122)
BKR ASPARTATE AMINOTRANSFERASE (AST): 18 U/L (ref 10–35)
BKR AST/ALT RATIO: 1.6
BKR BILIRUBIN DIRECT: <0.2 mg/dL (ref <=0.3)
BKR BILIRUBIN TOTAL: 0.3 mg/dL (ref <=1.2)
BKR GLOBULIN: 2.6 g/dL (ref 2.3–3.5)
BKR PROTEIN TOTAL: 6.7 g/dL (ref 6.6–8.7)

## 2022-01-17 LAB — UA REFLEX CULTURE

## 2022-01-17 LAB — TRICHOMONAS VAGINALIS BY NAAT: BKR TRICHOMONAS VAGINALIS NAAT: NEGATIVE

## 2022-01-17 MED ORDER — DOXYCYCLINE HYCLATE 100 MG CAPSULE
100 mg | ORAL_CAPSULE | Freq: Two times a day (BID) | ORAL | 1 refills | Status: AC
Start: 2022-01-17 — End: ?

## 2022-01-17 MED ORDER — KETOROLAC 30 MG/ML (1 ML) INJECTION SOLUTION
30 mg/mL (1 mL) | Freq: Once | INTRAVENOUS | Status: CP
Start: 2022-01-17 — End: ?
  Administered 2022-01-17: 15:00:00 30 mL via INTRAVENOUS

## 2022-01-17 MED ORDER — IBUPROFEN 600 MG TABLET
600 mg | ORAL_TABLET | Freq: Four times a day (QID) | ORAL | 2 refills | Status: AC | PRN
Start: 2022-01-17 — End: ?

## 2022-01-17 MED ORDER — CEFTRIAXONE 500 MG VIAL & 1% LIDOCAINE
Freq: Once | INTRAMUSCULAR | Status: CP
Start: 2022-01-17 — End: ?
  Administered 2022-01-17: 17:00:00 1.430 mL via INTRAMUSCULAR

## 2022-01-17 MED ORDER — MEDROXYPROGESTERONE 10 MG TABLET
10 mg | ORAL_TABLET | Freq: Every day | ORAL | 1 refills | Status: AC
Start: 2022-01-17 — End: ?

## 2022-01-17 MED ORDER — HYOSCYAMINE SULFATE 0.125 MG TABLET
0.125 mg | ORAL_TABLET | ORAL | 1 refills | Status: AC | PRN
Start: 2022-01-17 — End: ?

## 2022-01-17 MED ORDER — DOXYCYCLINE TAB/CAP 100 MG (WRAPPED E-RX)
100 mg | Freq: Once | ORAL | Status: DC
Start: 2022-01-17 — End: 2022-01-17

## 2022-01-17 MED ORDER — METRONIDAZOLE 500 MG TABLET
500 mg | ORAL_TABLET | Freq: Two times a day (BID) | ORAL | 1 refills | Status: AC
Start: 2022-01-17 — End: ?

## 2022-01-17 MED ORDER — SODIUM CHLORIDE 0.9 % BOLUS (NEW BAG)
0.9 % | Freq: Once | INTRAVENOUS | Status: CP
Start: 2022-01-17 — End: ?
  Administered 2022-01-17: 14:00:00 0.9 mL/h via INTRAVENOUS

## 2022-01-17 MED ORDER — METRONIDAZOLE 500 MG TABLET
500 mg | Freq: Once | ORAL | Status: DC
Start: 2022-01-17 — End: 2022-01-17

## 2022-01-17 MED ORDER — SODIUM CHLORIDE 0.9 % BOLUS (NEW BAG)
0.9 % | Freq: Once | INTRAVENOUS | Status: CP
Start: 2022-01-17 — End: ?
  Administered 2022-01-17: 16:00:00 0.9 mL/h via INTRAVENOUS

## 2022-01-17 MED ORDER — ACETAMINOPHEN 500 MG TABLET
500 mg | ORAL_TABLET | Freq: Four times a day (QID) | ORAL | 1 refills | Status: AC | PRN
Start: 2022-01-17 — End: ?

## 2022-01-17 NOTE — Telephone Encounter
telehealth appt scheduled.Freddy Jaksch, CNM12/27/2023

## 2022-01-17 NOTE — ED Notes
7:10 AM Report received by off-going RN, patient care assumed at this time.Brief Hx of Presentation: Mariah Ferguson is a 26 y.o. female with a chief complaint of Abdominal Pain (Pt presents for eval of abdominal pain, LLQ sharp, started 1 hr PTA, no n/v/d, has h/o ovarian cyst, presents with similar symptoms) Pertinent medical Hx: No past medical history on file. No orders to display

## 2022-01-17 NOTE — Telephone Encounter
Pt calling,she was in the ED today and discharged,she was told she has 2 cyst on her ovaries and is in a lot of pain,they gave her an antibiotic but she is refusing to take medication. She is feeling very shuffled around with no helpful outcome. She is very highly set on getting a hysterectomy due to the back and forth to the Ed. She is feeling very sad and depressed and was very teary eyed when she called here.Mariah Ferguson was only asking for you states you know her hx and I told her I would send you a call

## 2022-01-17 NOTE — ED Provider Notes
Chief Complaint Patient presents with ? Abdominal Pain   Pt presents for eval of abdominal pain, LLQ sharp, started 1 hr PTA, no n/v/d, has h/o ovarian cyst, presents with similar symptoms HPI/PE:A[age] yo with colicky pelvic pain for months now worse LLQ, afebrile, lungs CTA, abd soft mild LLQ tenderness, check labs r/o pregnancy, infection, anemia AKI, electrolyte shifts.  Ebrima Ranta Jacqualyn Posey, DO   Physical ExamED Triage Vitals [01/17/22 0630]BP: 109/66Pulse: 90Pulse from  O2 sat: n/aResp: 18Temp: 98.5 ?F (36.9 ?C)Temp src: OralSpO2: 98 % BP 103/65  - Pulse 75  - Temp 98.8 ?F (37.1 ?C)  - Resp 18  - SpO2 100% Physical Exam ProceduresAttestation/Critical CarePatient Reevaluation: I reviewed the labs and not pregnant, I reviewed the radiology images and moderate free fluid Gyn scrubbed in OR, left message with nurse and sent textSpoke with Gyn and reviewed labs and imaging and as long as hemodynamically stable safe for outpatient follow upAn acute or life threatening problem was considered during this evaluation:A decision regarding hospitalization was made during this visit.Patient does not require admission or further ED Observation at this time.Decision made to dischargeVinu Francisco Eyerly, DO Clinical Impressions as of 01/17/22 1301 Hemorrhagic cysts of both ovaries  ED DispositionDischarge Linus Orn, DO12/27/23 1302

## 2022-01-18 ENCOUNTER — Inpatient Hospital Stay: Admit: 2022-01-18 | Discharge: 2022-01-18 | Payer: MEDICAID | Primary: Obstetrics and Gynecology

## 2022-01-18 ENCOUNTER — Encounter
Admit: 2022-01-18 | Payer: PRIVATE HEALTH INSURANCE | Attending: Obstetrics and Gynecology | Primary: Obstetrics and Gynecology

## 2022-01-18 DIAGNOSIS — A599 Trichomoniasis, unspecified: Secondary | ICD-10-CM

## 2022-01-18 DIAGNOSIS — N83201 Unspecified ovarian cyst, right side: Secondary | ICD-10-CM

## 2022-01-18 DIAGNOSIS — N7011 Chronic salpingitis: Secondary | ICD-10-CM

## 2022-01-18 DIAGNOSIS — Z01419 Encounter for gynecological examination (general) (routine) without abnormal findings: Secondary | ICD-10-CM

## 2022-01-18 DIAGNOSIS — E282 Polycystic ovarian syndrome: Secondary | ICD-10-CM

## 2022-01-18 DIAGNOSIS — N912 Amenorrhea, unspecified: Secondary | ICD-10-CM

## 2022-01-18 DIAGNOSIS — N730 Acute parametritis and pelvic cellulitis: Secondary | ICD-10-CM

## 2022-01-18 DIAGNOSIS — R3989 Other symptoms and signs involving the genitourinary system: Secondary | ICD-10-CM

## 2022-01-18 NOTE — Progress Notes
VIDEO TELEHEALTH VISIT: This clinician is part of the telehealth program and is conducting this visit in a currently approved location. For this visit the clinician and patient were present via interactive audio & video telecommunications system that permits real-time communications, via the Alternative HIPAA Compliant platform.Patient's use of the telehealth platform followed consent and acknowledges agreement to permit telehealth for this visit. State patient is located in: [place] clinician is appropriately licensed in the above state to provide care for this visit. Other individuals present during the telehealth encounter and their role/relation: none Because this visit was completed over video, a hands-on physical exam was not performed.  Patient/parent or guardian understands and knows to call back if condition changes. [facility], LLCProblem Visit - LLQ pelvic pain/ED follow up/hx hydrosalpinx/PCOS12/27/2023		No LMP recorded.:	There were no vitals filed for this visit.Weight   lbs.Chief Concern:Was seen in ED overnight with LLQ that per ED note started an hour prior to her arrival. She said her sx started on Sunday (4 days ago) but tried to ignore it becuase she thought it was just that her period was coming. Felt the pain in her left pelvic area. Also felt sharp pains, numbness and tingling in her left leg and hip. Was concerned given her her hx hydrosalpinx and that it felt similar in quality to when was admitted with hydrosalpinx. Additionally c/o bladder pressure.Unfortunately they gave her little info in the ED. She had a TVUS, which showed:The uterus is anteverted and measures 6.6 x 3.6 x 3.0 cm. No focal masses are seen. The endometrium measures 0.8 cm.?Evaluation of the right adnexa demonstrates 5.9 x 5.2 x 4.8 cm multiloculated cystic structure that may represent a combination of hemorrhagic ovarian cysts versus a hemorrhagic ovarian cyst and hematosalpinx. Moderate amount of right adnexal free fluid is also noted. There is arterial flow on color and pulsed Doppler with a resistive index of 0.5. ?The left ovary measures 3.3 x 1.4 x 1.5 cm. There is arterial flow on color and pulsed Doppler with a resistive index of 0.5. Within the left adnexa there is a mildly thick-walled, tubular, avascular fluid-filled structure, similar to prior, suggestive of hydrosalpinx.They also gave her ceftriaxone, despite the fact that she has not had sex since August and has had a neg STI screening since then, and despite the fact that her sx are well explained by her US findings. She does have hx PID x 1 r/t trich, though has had multiple neg STI screen since then. They also rx'd oupt abx, though she does not want to take them given GI sx the last time she did.Feels emotionally, mentally, and taxed by the pain and the unknowns. Feels that they did not explain anything well in the ED. The pain now has been improving, though is still present and comes and goes.She has not had a period since end of September. Presumed cause is PCOS (evidence of which was seen on [place]), though she did not complete the blood work I ordered last month (TSH, prolactin)She had been with 1 partner since age 25, last seen in August because he [place] of state.Contributing History:Menstrual hx:  See aboveSexual activity / Partners: See aboveContraception: Not currently SAReview of Systems: As above, otherwise negativeConstitutional No weight loss, fever, nightsweats or fatigue. Skin No rash, new skin cancer, growth, or changing moles. Eyes No uncorrectable vision, double vision, or eye pain. ENT No nasal congestion,ear pain, severe snoring, or daytime drowsiness. Respiratory No dyspnea, wheezing or cough. Cardiac No chest pain, palpitations, or syncope.  GI No dysphagia, heartburn, abnormal stool color, or change in bowel pattern. GU No frequency, dysuria, nocturia or incontinence Reproductive No abnormal bleeding, dyspareunia, vaginal discharge or dryness. Musculoskeletal No new or troublesome joint pain nor severe back pain. Neuro No severe headaches, seizures, or memory loss Endocrine No polyuria, polydipsia, excessive fatigue, or bone pain. Psychiatric No depressive feelings, anhedonia, anxiety, or sleep problem. Hematologic No excessive bleeding or easy bruising. Patient Active Problem List  Diagnosis  ? PID (acute pelvic inflammatory disease)  ? Left adnexal tenderness  ALLERGIES: She has No Known Allergies. PAST MEDICAL HISTORY: She  has no past medical history on file. PAST SURGICAL HISTORY: She  has no past surgical history on file. PAST OBSTETRICAL HISTORY: OB History  No obstetric history on file. FAMILY HISTORY: Her family history is not on file. SOCIAL HISTORY: She   MEDICATIONS: She has a current medication list which includes the following prescription(s): acetaminophen (TYLENOL) 500 mg tablet - Take 1 tablet (500 mg total) by mouth every 6 (six) hours as needed for up to 10 days, doxycycline hyclate (VIBRAMYCIN) 100 mg capsule - Take 1 capsule (100 mg total) by mouth 2 (two) times daily for 7 days, hyoscyamine (ANASPAZ,LEVSIN) 0.125 mg tablet - Take 1 tablet (0.125 mg total) by mouth every 4 (four) hours as needed for cramping for up to 10 days, and metroNIDAZOLE (FLAGYL) 500 mg tablet - Take 1 tablet (500 mg total) by mouth 2 (two) times daily for 7 days, and the following Facility-Administered Medications: doxycycline TAB/CAP 100 mg and metroNIDAZOLE (FLAGYL) tablet 500 mg. Exam:Deferred due to visit formatAssessment:Pelvic pain likely r/t: on R: a combination of hemorrhagic ovarian cysts versus a hemorrhagic ovarian cyst and hematosalpinx; on L: hydrosalpinx, which is stable from priorAmenorrhea since end of Sept, no SA since then, presumably r/t PCOS, though has not yet gone for TSH and Prl I ordered last monthBladder pressurePlan:                                                                                                    Given no evidence of STI and no possible exposure, no benefit from abx at this time Ibuprofen 600mg  Q6h Rx'd PRN for painPlan for Provera 10mg  x 10d to induce a bleed given amenorrhea for nearly 40m, though endo stripe normal on [place] todayThen advised starting COCPs after for cyst preventionUrine culture ordered, advised going for labs at the same timeRepeat [place] in 4-6 weeks at MFM, ordered Offered support and thorough explanation of US findings and her ED courseRTO 2-3 weeks in person for follow Marcene Duos, CNM 01/17/2022

## 2022-01-18 NOTE — Progress Notes
VIDEO TELEHEALTH VISIT: This clinician is part of the telehealth program and is conducting this visit in a currently approved location. For this visit the clinician and patient were present via interactive audio & video telecommunications system that permits real-time communications, via the Alternative HIPAA Compliant platform.Patient's use of the telehealth platform followed consent and acknowledges agreement to permit telehealth for this visit. State patient is located in: [place] clinician is appropriately licensed in the above state to provide care for this visit. Other individuals present during the telehealth encounter and their role/relation: none Because this visit was completed over video, a hands-on physical exam was not performed.  Patient/parent or guardian understands and knows to call back if condition changes. [facility], LLCProblem Visit - Follow up US12/28/2023		Patient's last menstrual period was 10/18/2021.:	There were no vitals filed for this visit.Weight   lbs.Chief Concern:Mariah Ferguson is 26 y.o. who I saw 3 weeks ago when she presented for late period and left adnexal tenderness, which has been a long standing issue for her. Hx PID x 1 (trich noted), and presumptive PID, where no organisms of concern were identified. TVUS 06/2021 was concerning for left hydrosalpinx.?Note throbbing left lower abdomen when she walks, comes and goes, happens most days on and off, only when she's walking. Lasts 2 hours then resolves. Makes her want to sit down. Started about a month ago?Notes constipation issues. Has a BM twice a week. ?Last period was end of sept. Labs and [place] were ordered at time of last visit, but she has not yet completed the lab work. [place] at [facility] showed: normal uterus and endometrium, but increased volume of both ovaries with multiple follicles on eachReview of Systems: As above, otherwise negativeConstitutional No weight loss, fever, nightsweats or fatigue. Skin No rash, new skin cancer, growth, or changing moles. Eyes No uncorrectable vision, double vision, or eye pain. ENT No nasal congestion,ear pain, severe snoring, or daytime drowsiness. Respiratory No dyspnea, wheezing or cough. Cardiac No chest pain, palpitations, or syncope. GI No dysphagia, heartburn, abnormal stool color, or change in bowel pattern. GU No frequency, dysuria, nocturia or incontinence Reproductive No abnormal bleeding, dyspareunia, vaginal discharge or dryness. Musculoskeletal No new or troublesome joint pain nor severe back pain. Neuro No severe headaches, seizures, or memory loss Endocrine No polyuria, polydipsia, excessive fatigue, or bone pain. Psychiatric No depressive feelings, anhedonia, anxiety, or sleep problem. Hematologic No excessive bleeding or easy bruising. Patient Active Problem List  Diagnosis  ? Hydrosalpinx    Left, inpt admission 06/2021 ? Right ovarian cyst    01/17/22 ED [place]: right adnexa demonstrates 5.9 x 5.2 x 4.8 cm multiloculated cystic structure that may represent a combination of hemorrhagic ovarian cysts versus a hemorrhagic ovarian cyst and hematosalpinx.Rpt [place] in 1 month [ ]  ordered  ? PCOS (polycystic ovarian syndrome)    Multiple follicles seen on [place] ALLERGIES: She has No Known Allergies. PAST MEDICAL HISTORY: She  has no past medical history on file. PAST SURGICAL HISTORY: She  has no past surgical history on file. PAST OBSTETRICAL HISTORY: OB History  No obstetric history on file. FAMILY HISTORY: Her family history is not on file. SOCIAL HISTORY: She   MEDICATIONS: She has a current medication list which includes the following prescription(s): acetaminophen (TYLENOL) 500 mg tablet - Take 1 tablet (500 mg total) by mouth every 6 (six) hours as needed for up to 10 days, doxycycline hyclate (VIBRAMYCIN) 100 mg capsule - Take 1 capsule (100 mg total) by mouth 2 (two) times daily for  7 days, hyoscyamine (ANASPAZ,LEVSIN) 0.125 mg tablet - Take 1 tablet (0.125 mg total) by mouth every 4 (four) hours as needed for cramping for up to 10 days, ibuprofen (ADVIL,MOTRIN) 600 mg tablet - Take 1 tablet (600 mg total) by mouth every 6 (six) hours as needed for pain, medroxyPROGESTERone (PROVERA) 10 mg tablet - Take 1 tablet (10 mg total) by mouth daily for 10 days, and metroNIDAZOLE (FLAGYL) 500 mg tablet - Take 1 tablet (500 mg total) by mouth 2 (two) times daily for 7 days. Exam:Deferred due to visit formatAssessment:Hx hydrosalpinx and PID x 1 (though has been treated for PID multiple times) with mild LLQ sxAlso with constipationSecondary amenorrhea, not SA since prior to last period, multiple hcg negPlan:                                                                                                    Reviewed US findings c/w PCOSReviewed importance of going for blood work ordered LV to r/o other pathology for amenorrheaContinue constipation prevention strategies, reviewed that this may be contributing to her sxReviewed that if she goes 3 months without a period, we would want to induce a bleed with medication. Will be in touch  RTO annual and PRNKristin Benjaman Kindler, [place] 01/04/2022

## 2022-01-19 LAB — CBC WITH AUTO DIFFERENTIAL
BKR EGFR, CREATININE (CKD-EPI 2021): 0.03 x 1000/??L (ref 0.00–1.00)
BKR GLOBULIN: 2.19 x 1000/??L (ref 0.60–3.70)
BKR REFLEX URINE CULTURE: 0.01 x 1000/??L (ref 0.00–0.30)
BKR WAM ABSOLUTE IMMATURE GRANULOCYTES.: 0.01 x 1000/ÂµL (ref 0.00–0.30)
BKR WAM ABSOLUTE LYMPHOCYTE COUNT.: 2.19 x 1000/??L (ref 0.60–3.70)
BKR WAM ABSOLUTE NRBC (2 DEC): 0 x 1000/??L (ref 0.00–1.00)
BKR WAM ANALYZER ANC: 4.02 x 1000/??L (ref 2.00–7.60)
BKR WAM BASOPHIL ABSOLUTE COUNT.: 0.03 x 1000/ÂµL (ref 0.00–1.00)
BKR WAM BASOPHILS: 0.4 % (ref 0.0–1.4)
BKR WAM EOSINOPHIL ABSOLUTE COUNT.: 0.1 x 1000/ÂµL (ref 0.00–1.00)
BKR WAM EOSINOPHILS: 1.4 % (ref 0.0–5.0)
BKR WAM HEMATOCRIT (2 DEC): 39.3 % (ref 35.00–45.00)
BKR WAM HEMOGLOBIN: 12.6 g/dL (ref 11.7–15.5)
BKR WAM IMMATURE GRANULOCYTES: 0.1 % (ref 0.0–1.0)
BKR WAM LYMPHOCYTES: 31.2 % (ref 17.0–50.0)
BKR WAM MCH (PG): 31.3 pg (ref 27.0–33.0)
BKR WAM MCHC: 32.1 g/dL (ref 31.0–36.0)
BKR WAM MCV: 97.8 fL (ref 80.0–100.0)
BKR WAM MONOCYTE ABSOLUTE COUNT.: 0.66 x 1000/ÂµL (ref 0.00–1.00)
BKR WAM MONOCYTES: 9.4 % — ABNORMAL LOW (ref 4.0–12.0)
BKR WAM MPV: 10.9 fL (ref 8.0–12.0)
BKR WAM NEUTROPHILS: 57.5 % (ref 39.0–72.0)
BKR WAM NUCLEATED RED BLOOD CELLS: 0 % (ref 0.0–1.0)
BKR WAM PLATELETS: 174 x1000/ÂµL (ref 150–420)
BKR WAM RDW-CV: 12.9 % (ref 11.0–15.0)
BKR WAM RED BLOOD CELL COUNT.: 4.02 M/??L (ref 4.00–6.00)
BKR WAM WHITE BLOOD CELL COUNT: 7 x1000/??L (ref 4.0–11.0)

## 2022-01-19 LAB — COMPREHENSIVE METABOLIC PANEL
BKR A/G RATIO: 1.6 x 1000/??L (ref 1.0–2.2)
BKR ALANINE AMINOTRANSFERASE (ALT): 13 U/L (ref 10–35)
BKR ALBUMIN: 4 g/dL (ref 3.6–4.9)
BKR ALKALINE PHOSPHATASE: 65 U/L (ref 9–122)
BKR ASPARTATE AMINOTRANSFERASE (AST): 21 U/L (ref 10–35)
BKR AST/ALT RATIO: 1.6 x 1000/??L (ref 0.00–1.00)
BKR BILIRUBIN TOTAL: 0.2 mg/dL (ref 0.0–<=1.2)
BKR BLOOD UREA NITROGEN: 7 mg/dL (ref 6–20)
BKR BUN / CREAT RATIO: 8.2 % (ref 8.0–23.0)
BKR CALCIUM: 9 mg/dL (ref 8.8–10.2)
BKR CHLORIDE: 107 mmol/L (ref 98–107)
BKR CO2: 23 mmol/L (ref 20–30)
BKR CREATININE: 0.85 mg/dL (ref 0.40–1.30)
BKR GLUCOSE: 83 mg/dL (ref 70–100)
BKR HIV 1 AND 2 ANTIBODY/HIV-1 ANTIGEN SCREEN: 7 mg/dL (ref 6–20)
BKR POTASSIUM: 4.1 mmol/L (ref 3.3–5.3)
BKR PROTEIN TOTAL: 6.5 g/dL — ABNORMAL LOW (ref 6.6–8.7)
BKR SODIUM: 139 mmol/L (ref 136–144)

## 2022-01-19 LAB — LIPID PANEL
BKR CHOLESTEROL/HDL RATIO: 3 (ref 0.0–5.0)
BKR CHOLESTEROL: 146 mg/dL
BKR HDL CHOLESTEROL: 49 mg/dL (ref >=40)
BKR LDL CHOLESTEROL SAMPSON CALCULATED: 83 mg/dL
BKR TRIGLYCERIDES: 72 mg/dL

## 2022-01-19 LAB — TSH W/REFLEX TO FT4     (BH GH LMW Q YH): BKR THYROID STIMULATING HORMONE: 0.858 ??IU/mL

## 2022-01-19 LAB — HEMOGLOBIN A1C
BKR ESTIMATED AVERAGE GLUCOSE: 123 mg/dL
BKR HEMOGLOBIN A1C: 5.9 % — ABNORMAL HIGH (ref 4.0–5.6)

## 2022-01-19 LAB — HEPATITIS C AB WITH REFLEX TO HCV PCR
BKR ANION GAP: NEGATIVE g/dL (ref 7–17)
BKR HEPATITIS C ANTIBODY: NEGATIVE

## 2022-01-19 LAB — PROLACTIN: BKR PROLACTIN: 9.2 ng/mL (ref 4.8–23.3)

## 2022-01-19 LAB — TREPONEMA PALLIDUM (SYPHILIS) ANTIBODY W/REFLEX
BKR TREPONEMA PALLIDUM ANTIBODY INITIAL RESULT: <0.1 {index}
BKR TREPONEMA PALLIDUM ANTIBODY TOTAL, SERUM: NONREACTIVE

## 2022-01-19 LAB — URINE CULTURE: BKR URINE CULTURE, ROUTINE: NO GROWTH

## 2022-01-19 LAB — HEPATITIS B SURFACE ANTIGEN     (BH GH L LMW YH): BKR HEPATITIS B SURFACE ANTIGEN: NEGATIVE

## 2022-01-22 ENCOUNTER — Encounter
Admit: 2022-01-22 | Payer: PRIVATE HEALTH INSURANCE | Attending: Obstetrics and Gynecology | Primary: Obstetrics and Gynecology

## 2022-01-22 NOTE — Other
MyChart message sent to Sequoyah re: elevated A1c and need for PCP f/u.Sadie Pickar E Krew Hortman, CNM1/1/202411:31 AM

## 2022-01-23 ENCOUNTER — Encounter
Admit: 2022-01-23 | Payer: PRIVATE HEALTH INSURANCE | Attending: Obstetrics and Gynecology | Primary: Obstetrics and Gynecology

## 2022-01-24 ENCOUNTER — Encounter
Admit: 2022-01-24 | Payer: PRIVATE HEALTH INSURANCE | Attending: Obstetrics and Gynecology | Primary: Obstetrics and Gynecology

## 2022-01-24 DIAGNOSIS — N83201 Unspecified ovarian cyst, right side: Secondary | ICD-10-CM

## 2022-01-24 DIAGNOSIS — N730 Acute parametritis and pelvic cellulitis: Secondary | ICD-10-CM

## 2022-01-24 DIAGNOSIS — N7011 Chronic salpingitis: Secondary | ICD-10-CM

## 2022-01-24 DIAGNOSIS — A599 Trichomoniasis, unspecified: Secondary | ICD-10-CM

## 2022-01-24 DIAGNOSIS — R7303 Prediabetes: Secondary | ICD-10-CM

## 2022-01-24 DIAGNOSIS — E282 Polycystic ovarian syndrome: Secondary | ICD-10-CM

## 2022-01-25 ENCOUNTER — Encounter: Admit: 2022-01-25 | Payer: PRIVATE HEALTH INSURANCE | Primary: Obstetrics and Gynecology

## 2022-02-15 ENCOUNTER — Encounter: Admit: 2022-02-15 | Payer: MEDICAID | Attending: Obstetrics and Gynecology | Primary: Obstetrics and Gynecology

## 2022-02-15 DIAGNOSIS — E282 Polycystic ovarian syndrome: Secondary | ICD-10-CM

## 2022-02-15 DIAGNOSIS — R7303 Prediabetes: Secondary | ICD-10-CM

## 2022-02-15 DIAGNOSIS — N83201 Unspecified ovarian cyst, right side: Secondary | ICD-10-CM

## 2022-02-15 DIAGNOSIS — N7011 Chronic salpingitis: Secondary | ICD-10-CM

## 2022-02-15 MED ORDER — NORETHINDRONE 1 MG-E. ESTRADIOL 20 MCG (24)-IRON 75 MG (4) CHEW TABLET
1-2024754 mg-20 mcg(24) /75 mg (4) | ORAL_TABLET | Freq: Every day | ORAL | 6 refills | Status: AC
Start: 2022-02-15 — End: 2022-03-03

## 2022-02-15 NOTE — Progress Notes
[facility], LLCProblem Visit - follow up hx hydrosalpinx/ovarion cyst/PCOS/elevated A1C1/25/2024		No LMP recorded.:	There were no vitals filed for this visit.Weight   lbs.Chief Concern:Saw [name] in the office about a month ago after ED eval where she had been treated for PID despite all STI testing being negative. Unfortunately they gave her little info in the ED. She had a TVUS, which showed:The uterus is anteverted and measures 6.6 x 3.6 x 3.0 cm. No focal masses are seen. The endometrium measures 0.8 cm.?Evaluation of the right adnexa demonstrates 5.9 x 5.2 x 4.8 cm multiloculated cystic structure that may represent a combination of hemorrhagic ovarian cysts versus a hemorrhagic ovarian cyst and hematosalpinx. Moderate amount of right adnexal free fluid is also noted. There is arterial flow on color and pulsed Doppler with a resistive index of 0.5. ?The left ovary measures 3.3 x 1.4 x 1.5 cm. There is arterial flow on color and pulsed Doppler with a resistive index of 0.5. Within the left adnexa there is a mildly thick-walled, tubular, avascular fluid-filled structure, similar to prior, suggestive of hydrosalpinx.They also gave her ceftriaxone, despite the fact that she has not had sex since August and has had a neg STI screening since then, and despite the fact that her sx are well explained by her US findings. She does have hx PID x 1 r/t trich, though has had multiple neg STI screen since then. They also rx'd oupt abx, but she did not take them.She had not had a period since end of September. Presumed cause is PCOS (evidence of which was seen on [place]). She did a Provera challenge after LV and did get a withdrawal bleed. Went for blood work I had ordered, which was normal aside from A1C 5.9. Since we saw each other last, her pain has resolved. She has reduced soda from 2-3 a day down to once a week. Has tried to reduce processed foods. Started cooking. In a good space, has reduced a lot of stress and been better about boundary setting. Has gained 3#.No longer with [facility] partner. Not currently SA. Wants to wait until marriage.Contributing History:Menstrual hx:  See aboveSexual activity / Partners: See aboveContraception: Not currently SAReview of Systems: As above, otherwise negativeConstitutional No weight loss, fever, nightsweats or fatigue. Skin No rash, new skin cancer, growth, or changing moles. Eyes No uncorrectable vision, double vision, or eye pain. ENT No nasal congestion,ear pain, severe snoring, or daytime drowsiness. Respiratory No dyspnea, wheezing or cough. Cardiac No chest pain, palpitations, or syncope. GI No dysphagia, heartburn, abnormal stool color, or change in bowel pattern. GU No frequency, dysuria, nocturia or incontinence Reproductive No abnormal bleeding, dyspareunia, vaginal discharge or dryness. Musculoskeletal No new or troublesome joint pain nor severe back pain. Neuro No severe headaches, seizures, or memory loss Endocrine No polyuria, polydipsia, excessive fatigue, or bone pain. Psychiatric No depressive feelings, anhedonia, anxiety, or sleep problem. Hematologic No excessive bleeding or easy bruising. Patient Active Problem List  Diagnosis  ? Pre-diabetes    12/2021 HgbA1C 5.9%Repeat 64m, ordered ? Hydrosalpinx    Left, inpt admission 06/2021 ? Right ovarian cyst    01/17/22 ED [place]: right adnexa demonstrates 5.9 x 5.2 x 4.8 cm multiloculated cystic structure that may represent a combination of hemorrhagic ovarian cysts versus a hemorrhagic ovarian cyst and hematosalpinx.Rpt [place] in 1 month [ ]  ordered  ? PCOS (polycystic ovarian syndrome)    Multiple follicles seen on [place] ALLERGIES: She has No Known Allergies. PAST MEDICAL HISTORY: She  has a past medical  history of Hydrosalpinx (01/17/2022), PCOS (polycystic ovarian syndrome) (01/17/2022), PID (acute pelvic inflammatory disease), Pre-diabetes (01/24/2022), Right ovarian cyst (01/17/2022), and Trichomonal infection. PAST SURGICAL HISTORY: She  has no past surgical history on file. PAST OBSTETRICAL HISTORY: OB History   Gravida 0  Para 0  Term 0  Preterm 0  AB 0  Living 0   SAB 0  IAB 0  Ectopic 0  Molar 0  Multiple 0  Live Births 0    FAMILY HISTORY: Her family history is not on file. SOCIAL HISTORY: She   MEDICATIONS: She has a current medication list which includes the following prescription(s): ibuprofen (ADVIL,MOTRIN) 600 mg tablet - Take 1 tablet (600 mg total) by mouth every 6 (six) hours as needed for pain. Exam:Deferred due to visit formatAssessment:Pelvic pain, which has improved since we saw each other last.Last US showed - on R: a combination of hemorrhagic ovarian cysts versus a hemorrhagic ovarian cyst and hematosalpinx; on L: hydrosalpinx, which is stable from priorNo period Sept-Dec, got bleed after Provera challengePre-diabetic A1C, working on dietary changes and stress reductionPlan:                                                                                                    Reviewed option of COCP for ovarian cyst prevention and cycle management. She does not like to swallow pills, so Minastrin Rx'd. If has issues with OCP, will want depoRepeat [place] in 4-6 weeks at [facility], ordered  - reminded to scheduleCommended healthy lifestyle changesRTO 2 months for pill checkKristin Benjaman Kindler, CNM 02/15/2022

## 2022-02-19 ENCOUNTER — Encounter: Admit: 2022-02-19 | Payer: PRIVATE HEALTH INSURANCE | Primary: Obstetrics and Gynecology

## 2022-02-27 ENCOUNTER — Encounter
Admit: 2022-02-27 | Payer: PRIVATE HEALTH INSURANCE | Attending: Obstetrics and Gynecology | Primary: Obstetrics and Gynecology

## 2022-03-02 ENCOUNTER — Telehealth
Admit: 2022-03-02 | Payer: PRIVATE HEALTH INSURANCE | Attending: Obstetrics and Gynecology | Primary: Obstetrics and Gynecology

## 2022-03-02 NOTE — Telephone Encounter
Reaching out returning call and wants to talk didn't say to much.

## 2022-03-03 ENCOUNTER — Inpatient Hospital Stay
Admit: 2022-03-03 | Discharge: 2022-03-04 | Payer: MEDICAID | Attending: Student in an Organized Health Care Education/Training Program

## 2022-03-03 ENCOUNTER — Encounter
Admit: 2022-03-03 | Payer: PRIVATE HEALTH INSURANCE | Attending: Obstetrics and Gynecology | Primary: Obstetrics and Gynecology

## 2022-03-03 ENCOUNTER — Inpatient Hospital Stay: Admit: 2022-03-03 | Discharge: 2022-03-03 | Payer: MEDICAID

## 2022-03-03 ENCOUNTER — Emergency Department: Admit: 2022-03-03 | Payer: MEDICAID | Primary: Obstetrics and Gynecology

## 2022-03-03 DIAGNOSIS — R509 Fever, unspecified: Secondary | ICD-10-CM

## 2022-03-03 DIAGNOSIS — R1032 Left lower quadrant pain: Secondary | ICD-10-CM

## 2022-03-03 MED ORDER — MORPHINE 4 MG/ML INTRAVENOUS SOLUTION
4 mg/mL | Freq: Once | INTRAVENOUS | Status: DC
Start: 2022-03-03 — End: 2022-03-04

## 2022-03-03 MED ORDER — SODIUM CHLORIDE 0.9 % BOLUS (NEW BAG)
0.9 % | Freq: Once | INTRAVENOUS | Status: DC
Start: 2022-03-03 — End: 2022-03-04

## 2022-03-03 MED ORDER — NORETHINDRONE 1 MG-ETHINYL ESTRADIOL 20 MCG (24)-IRON 75 MG (4) TABLET
1-2024754 mg-20 mcg (24)/75 mg (4) | ORAL_TABLET | Freq: Every day | ORAL | 6 refills | Status: AC
Start: 2022-03-03 — End: ?

## 2022-03-03 MED ORDER — ACETAMINOPHEN 500 MG TABLET
500 mg | Freq: Three times a day (TID) | ORAL | Status: DC | PRN
Start: 2022-03-03 — End: 2022-03-04
  Administered 2022-03-04: 04:00:00 500 mg via ORAL

## 2022-03-04 ENCOUNTER — Emergency Department: Admit: 2022-03-04 | Payer: MEDICAID | Primary: Obstetrics and Gynecology

## 2022-03-04 DIAGNOSIS — R509 Fever, unspecified: Secondary | ICD-10-CM

## 2022-03-04 DIAGNOSIS — E282 Polycystic ovarian syndrome: Secondary | ICD-10-CM

## 2022-03-04 DIAGNOSIS — Z5329 Procedure and treatment not carried out because of patient's decision for other reasons: Secondary | ICD-10-CM

## 2022-03-04 DIAGNOSIS — Z8742 Personal history of other diseases of the female genital tract: Secondary | ICD-10-CM

## 2022-03-04 DIAGNOSIS — R102 Pelvic and perineal pain: Secondary | ICD-10-CM

## 2022-03-04 DIAGNOSIS — M549 Dorsalgia, unspecified: Secondary | ICD-10-CM

## 2022-03-04 DIAGNOSIS — R1032 Left lower quadrant pain: Secondary | ICD-10-CM

## 2022-03-04 LAB — URINALYSIS WITH CULTURE REFLEX      (BH LMW YH)
BKR BILIRUBIN, UA: NEGATIVE
BKR BILIRUBIN, UA: NEGATIVE
BKR BLOOD, UA: NEGATIVE
BKR BLOOD, UA: NEGATIVE
BKR GLUCOSE, UA: NEGATIVE
BKR GLUCOSE, UA: NEGATIVE g/dL (ref 3.6–4.9)
BKR KETONES, UA: NEGATIVE
BKR KETONES, UA: NEGATIVE
BKR LEUKOCYTE ESTERASE, UA: NEGATIVE
BKR LEUKOCYTE ESTERASE, UA: NEGATIVE U/L (ref 10–35)
BKR NITRITE, UA: NEGATIVE
BKR NITRITE, UA: NEGATIVE
BKR PH, UA: 6 (ref 5.5–7.5)
BKR PH, UA: 7 (ref 5.5–7.5)
BKR PROTEIN, UA: NEGATIVE
BKR PROTEIN, UA: NEGATIVE
BKR SPECIFIC GRAVITY, UA: 1.02 (ref 1.005–1.030)
BKR SPECIFIC GRAVITY, UA: 1.012 mg/dL (ref 1.005–1.030)
BKR UROBILINOGEN, UA (MG/DL): <2 mg/dL (ref <=2.0)
BKR UROBILINOGEN, UA (MG/DL): <2 mg/dL (ref <=2.0)

## 2022-03-04 LAB — CBC WITH AUTO DIFFERENTIAL
BKR CREATININE: 32.6 pg (ref 27.0–33.0)
BKR WAM ABSOLUTE IMMATURE GRANULOCYTES.: 0.04 x 1000/??L (ref 0.00–0.30)
BKR WAM ABSOLUTE LYMPHOCYTE COUNT.: 3.42 x 1000/ÂµL (ref 0.60–3.70)
BKR WAM ABSOLUTE NRBC (2 DEC): 0 x 1000/??L (ref 0.00–1.00)
BKR WAM ANALYZER ANC: 6.03 x 1000/ÂµL (ref 2.00–7.60)
BKR WAM BASOPHIL ABSOLUTE COUNT.: 0.03 x 1000/??L (ref 0.00–1.00)
BKR WAM BASOPHILS: 0.3 % (ref 0.0–1.4)
BKR WAM EOSINOPHIL ABSOLUTE COUNT.: 0.1 x 1000/??L (ref 0.00–1.00)
BKR WAM EOSINOPHILS: 0.9 % (ref 0.0–5.0)
BKR WAM HEMATOCRIT (2 DEC): 41.6 % (ref 35.00–45.00)
BKR WAM HEMOGLOBIN: 14.3 g/dL (ref 11.7–15.5)
BKR WAM IMMATURE GRANULOCYTES: 0.4 % (ref 0.0–1.0)
BKR WAM LYMPHOCYTES: 32.1 % (ref 17.0–50.0)
BKR WAM MCH (PG): 32.6 pg (ref 27.0–33.0)
BKR WAM MCHC: 34.4 g/dL (ref 31.0–36.0)
BKR WAM MCV: 95 fL (ref 80.0–100.0)
BKR WAM MONOCYTE ABSOLUTE COUNT.: 1.05 x 1000/ÂµL — ABNORMAL HIGH (ref 0.00–1.00)
BKR WAM MONOCYTES: 9.8 % (ref 4.0–12.0)
BKR WAM MPV: 10.6 fL (ref 8.0–12.0)
BKR WAM NUCLEATED RED BLOOD CELLS: 0 % (ref 0.0–1.0)
BKR WAM PLATELETS: 204 x1000/ÂµL (ref 150–420)
BKR WAM RDW-CV: 12.5 % (ref 11.0–15.0)
BKR WAM RED BLOOD CELL COUNT.: 4.38 M/??L (ref 4.00–6.00)
BKR WAM WHITE BLOOD CELL COUNT: 10.7 x1000/ÂµL (ref 4.0–11.0)

## 2022-03-04 LAB — COMPREHENSIVE METABOLIC PANEL
BKR A/G RATIO: 1.7 % (ref 1.0–2.2)
BKR ALANINE AMINOTRANSFERASE (ALT): 16 U/L (ref 10–35)
BKR ALBUMIN: 4.5 g/dL (ref 3.6–4.9)
BKR ALKALINE PHOSPHATASE: 61 U/L (ref 9–122)
BKR ANION GAP: 12 (ref 7–17)
BKR ASPARTATE AMINOTRANSFERASE (AST): 31 U/L (ref 10–35)
BKR AST/ALT RATIO: 1.9 % (ref 0.0–1.0)
BKR BILIRUBIN TOTAL: 0.2 mg/dL (ref <=1.2)
BKR BLOOD UREA NITROGEN: 16 mg/dL (ref 6–20)
BKR BUN / CREAT RATIO: 20.3 (ref 8.0–23.0)
BKR CALCIUM: 9.4 mg/dL (ref 8.8–10.2)
BKR CHLORIDE: 104 mmol/L (ref 98–107)
BKR CO2: 22 mmol/L (ref 20–30)
BKR EGFR, CREATININE (CKD-EPI 2021): >60 mL/min/{1.73_m2} (ref >=60–7.60)
BKR GLOBULIN: 2.7 g/dL (ref 2.3–3.5)
BKR GLUCOSE: 86 mg/dL (ref 70–100)
BKR POTASSIUM: 4 mmol/L (ref 3.3–5.3)
BKR PROTEIN TOTAL: 7.2 g/dL (ref 6.6–8.7)
BKR SODIUM: 138 mmol/L (ref 136–144)
BKR WAM NEUTROPHILS: 0.2 mg/dL (ref 39.0–<=1.2)

## 2022-03-04 LAB — CHLAMYDIA TRACHOMATIS, NAAT (LAB ORDER ONLY) (BH GH L LMW YH): BKR CHLAMYDIA, DNA PROBE: NEGATIVE

## 2022-03-04 LAB — UA REFLEX CULTURE

## 2022-03-04 LAB — NEISSERIA GONORRHEA, NAAT (LAB ORDER ONLY)   (BH GH L LMW YH): BKR NEISSERIA GONORRHOEAE, DNA PROBE: NEGATIVE

## 2022-03-04 LAB — LIPASE: BKR LIPASE: 42 U/L (ref 11–55)

## 2022-03-04 MED ORDER — MORPHINE 4 MG/ML INTRAVENOUS SOLUTION
4 mg/mL | Freq: Once | SUBCUTANEOUS | Status: CP
Start: 2022-03-04 — End: ?
  Administered 2022-03-04: 07:00:00 4 mL via SUBCUTANEOUS

## 2022-03-04 MED ORDER — KETOROLAC 15 MG/ML INJECTION SOLUTION
15 mg/mL | Freq: Once | INTRAVENOUS | Status: CP
Start: 2022-03-04 — End: ?
  Administered 2022-03-04: 06:00:00 15 mL via INTRAVENOUS

## 2022-03-04 NOTE — ED Notes
9:34 PM Pt politely refusing care, stating she is on the phone with her oB/GYN, who is at [facility] at this time, and would like to see pt in person. :She said you all dont have the OB specialist I need here, and that she wants to be there personally when they do my ultrasound. PA Kuka notified, and states pt does not have to sign AMA from, as she is continuing to seek care within system. Pt offered transportation, but has already arranged her own Benedetto Goad to [facility]. Morphine returned to [facility]. Pt brought to [facility] to wait for ride.

## 2022-03-04 NOTE — Discharge Instructions
You were seen in the ER today for left lower abdominal pain.  On your ultrasound, it looks like your cysts are improving.  Please start the birth control pills that were supposed to start.  If you start to experience worsening abdominal pain, nausea, vomiting, fevers or chills, please come back to the ER.  As scheduled, please follow-up with your outpatient gynecologist.

## 2022-03-04 NOTE — ED Notes
11:23 PM Pt ambulated to restroom with stand by assist without difficulty3:28 AM Pt resting with eyes closed. Resp even and unlabored5:10 AM Pt resting with eyes closed. Resp even and unlabored

## 2022-03-04 NOTE — ED Notes
10:36 PM Assumed care of pt. Pt crying in room due to pain. Pt given ice pack. Provider notified of pts condition. Pt states she was told by her OBGYN MD to come to [facility] to be seen. Pt states she has a history of ruptured cyst and pain feels similar. Pt states pain is intermittent and sharp in LLQ and suprapubic area. Denies vaginal bleeding. Denies CP/SOB, n/v.

## 2022-03-04 NOTE — ED Notes
6:16 AM Discharge instructions reviewed with pt. Verbalized understanding of instructions given. Ambulatory to triage without difficulty

## 2022-03-04 NOTE — ED Provider Notes
Chief Complaint Patient presents with ? Abdominal Pain   Llq abd pain; dx'd with hemorrhagic ovarian cyst and pcos 02/24/2022 at [facility] ED. Pt was rx'd medication for pain but not available at pharmacy.  HPI/PE:This is a 27 year old female with a medical history significant for TOA 06/2021, PCOS complicated by hemorrhagic cyst rupture in the past.  She is presenting for evaluation of acute on chronic lower left quadrants abdominal pain that feels similar to her symptoms prior to cyst rupture in the past.  She also endorses subjective fevers, chills.She is not sexually active.She denies nausea, vomiting, diarrhea, constipation.ONG:EXBMWUX called her OBGYN who is currently at [facility].  Patient decided to elope and get an over to [facility] where she can be seen and managed by her OB.  We recommended that she be treated and stabilized here before being transported for further OB care however patient would like to leave now.  She understands the risks and consequences of leaving up to and including death.  Physical ExamED Triage Vitals [03/03/22 2011]BP: (!) 128/92Pulse: (!) 92Pulse from  O2 sat: n/aResp: (!) 22Temp: 97.7 ?F (36.5 ?C)Temp src: OralSpO2: 100 % BP (!) 128/92  - Pulse (!) 92  - Temp 97.7 ?F (36.5 ?C) (Oral)  - Resp (!) 22  - Ht 5' (1.524 m)  - Wt 42.2 kg (93 lb)  - LMP 01/21/2022  - SpO2 100%  - BMI 18.16 kg/m? Physical ExamConstitutional:     Appearance: Normal appearance. HENT:    Head: Normocephalic and atraumatic.    Right Ear: External ear normal.    Left Ear: External ear normal.    Mouth/Throat:    Mouth: Mucous membranes are moist.    Pharynx: Oropharynx is clear. Eyes:    Extraocular Movements: Extraocular movements intact.    Conjunctiva/sclera: Conjunctivae normal.    Pupils: Pupils are equal, round, and reactive to light. Cardiovascular:    Rate and Rhythm: Normal rate and regular rhythm.    Pulses: Normal pulses.    Heart sounds: Normal heart sounds. No murmur heard.   No friction rub. No gallop. Pulmonary:    Effort: Pulmonary effort is normal.    Breath sounds: Normal breath sounds. Abdominal:    General: Abdomen is flat and scaphoid. Bowel sounds are normal. There is no distension.    Palpations: Abdomen is soft.    Tenderness: There is abdominal tenderness in the right lower quadrant, suprapubic area and left lower quadrant. There is guarding. Musculoskeletal:       General: Normal range of motion.    Cervical back: No rigidity or tenderness. Skin:   General: Skin is warm and dry.    Capillary Refill: Capillary refill takes less than 2 seconds. Neurological:    General: No focal deficit present.    Mental Status: She is alert and oriented to person, place, and time. Mental status is at baseline. Psychiatric:       Mood and Affect: Mood normal.       Behavior: Behavior normal.       Thought Content: Thought content normal.       Judgment: Judgment normal.  ProceduresAttestation/Critical CareComments as of 03/03/22 2140 Sat Mar 03, 2022 2054 Preg Test, Urine: Negative [SK]  Comments User Index[SK] Alonza Bogus, PA   Clinical Impressions as of 03/03/22 2140 Pelvic pain  ED DispositionAma Alonza Bogus, PA02/10/24 2140

## 2022-03-04 NOTE — ED Notes
11:04 PM Report from previous RN. Dispo pending work up and MD evaluation

## 2022-03-05 ENCOUNTER — Encounter
Admit: 2022-03-05 | Payer: PRIVATE HEALTH INSURANCE | Attending: Obstetrics and Gynecology | Primary: Obstetrics and Gynecology

## 2022-03-07 NOTE — ED Provider Notes
Chief Complaint Patient presents with ? Abdominal Pain   The patient presents c/o LLQ abdominal pain. The patient presents after being at another facility with a history of cysts that rupture. The patient endorse fever and chills.  HPI/PE:27 y.o. female presents after eloping from [facility] ED for LLQ abdominal pain. Acute on chronic. PMHx significant for ovarian cysts. States pain waxes and wanes. She is comfortable at time of assessment 23:20. Afebrile. Some back pain. Denies hematuria. No nausea or vomiting associated with the pain in her abdomen. Denies diarrhea/constipation. 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 MDM:- DDx: ovarian cyst, ovarian torsion, ectopic pregnancy, constipation, femoral or inguinal hernia - Workup: UA and basic labs unremarkable, ordering abdominal ultrasound   Physical ExamED Triage Vitals [03/03/22 2157]BP: 116/78Pulse: 74Pulse from  O2 sat: n/aResp: 16Temp: 97.6 ?F (36.4 ?C)Temp src: n/aSpO2: 98 % BP 116/78  - Pulse 74  - Temp 97.6 ?F (36.4 ?C)  - Resp 16  - LMP 01/21/2022  - SpO2 98% Physical ExamConstitutional:     General: She is not in acute distress.   Appearance: She is well-developed and normal weight. She is not ill-appearing or toxic-appearing. Cardiovascular:    Rate and Rhythm: Normal rate and regular rhythm.    Heart sounds: Normal heart sounds. Pulmonary:    Effort: Pulmonary effort is normal. No respiratory distress.    Breath sounds: Normal breath sounds. Abdominal:    General: Abdomen is flat.    Tenderness: There is abdominal tenderness in the left lower quadrant. There is left CVA tenderness. There is no right CVA tenderness or guarding. Genitourinary:   Adnexa: Right adnexa normal and left adnexa normal.      Right: No mass.        Left: No mass.   Neurological:    General: No focal deficit present.    Mental Status: She is alert. ProceduresAttestation/Critical CarePatient Reevaluation: Attending Supervised: ResidentI saw and examined the patient. I agree with the findings and plan of care as documented in the resident's note except as noted below. Additional acute and/or chronic problems addressed: Simonne Maffucci is a 27 year old female with past medical history of PID, complicated by TOA/left sided pyosalpinx in June of 2023 w/repeat TVUS for RLQ pain notable for right adnexa demonstrates 5.9 x 5.2 x 4.8 cm multiloculated cystic structure that may represent a combination of hemorrhagic ovarian cysts versus a hemorrhagic ovarian cyst and hematosalpinx in December of 2023 that now presents to the ED with left-sided lower abdominal pain, intermittent in nature since this morning.  Patient reports that she has been sexually active since October of last year, has not noticed any vaginal discharge, urinary symptoms such as dysuria, hematuria or increased frequency.  Reports that the pain on the left lower quadrant is radiating to the back.  No prior history of kidney stones.  On exam, patient uncomfortable with reassuring vital signs.  Lungs clear to auscultation.  Heart sounds normal.  Abdomen with tenderness in the left lower quadrant.  Concern for ovarian torsion versus ruptured hemorrhagic cyst, plan for labs, pain control, transvaginal ultrasound.Eulis Foster, MD3:11 AM On my review of labs, largely reassuring.  On my review of transvaginal ultrasound, patient with no evidence of ovarian torsion. Previously seen likely right hemorrhagic cyst is now significantly decreased in size, measuring 1.4 x 1.4 x 1.4 cm.  Spoke to patient extensively, has not been taking the birth control she was supposed to be taking, plan for birth control.  All questions  and concerns have been addressed.  Strict return precautions providEulis Fostervuluri, MD5:26 AMClinical Impressions as of 03/04/22 0546 Abdominal pain, unspecified abdominal location  ED DispositionDischargeElectronically SigEstrella DeedsJ Hatten, MDPGY-1, Internal Medicine - Primary CareAvailable via Mobile Heartbeat 2/10/Bunnie Dominosh Kumar, MD01610/11/Bunnie Dominouresh Kumar, MD02/11/24 0526 Bunnie Domino, MD02/13/24 2239

## 2022-03-14 ENCOUNTER — Inpatient Hospital Stay: Admit: 2022-03-14 | Discharge: 2022-03-14 | Payer: MEDICAID

## 2022-03-14 ENCOUNTER — Emergency Department: Admit: 2022-03-14 | Payer: MEDICAID | Attending: Anesthesiology | Primary: Obstetrics and Gynecology

## 2022-03-14 ENCOUNTER — Emergency Department: Admit: 2022-03-14 | Payer: MEDICAID | Primary: Obstetrics and Gynecology

## 2022-03-14 ENCOUNTER — Telehealth
Admit: 2022-03-14 | Payer: PRIVATE HEALTH INSURANCE | Attending: Obstetrics and Gynecology | Primary: Obstetrics and Gynecology

## 2022-03-14 DIAGNOSIS — R350 Frequency of micturition: Secondary | ICD-10-CM

## 2022-03-14 DIAGNOSIS — R1031 Right lower quadrant pain: Secondary | ICD-10-CM

## 2022-03-14 DIAGNOSIS — R112 Nausea with vomiting, unspecified: Secondary | ICD-10-CM

## 2022-03-14 LAB — CBC WITH AUTO DIFFERENTIAL
BKR WAM ABSOLUTE IMMATURE GRANULOCYTES.: 0.04 x 1000/ÂµL (ref 0.00–0.30)
BKR WAM ABSOLUTE IMMATURE GRANULOCYTES.: 0.03 x 1000/ÂµL (ref 0.00–0.30)
BKR WAM ABSOLUTE LYMPHOCYTE COUNT.: 0.59 x 1000/ÂµL — ABNORMAL LOW (ref 0.60–3.70)
BKR WAM ABSOLUTE LYMPHOCYTE COUNT.: 1.22 x 1000/ÂµL (ref 0.60–3.70)
BKR WAM ABSOLUTE NRBC (2 DEC): 0 x 1000/ÂµL (ref 0.00–1.00)
BKR WAM ABSOLUTE NRBC (2 DEC): 0 x 1000/ÂµL (ref 0.00–1.00)
BKR WAM ANALYZER ANC: 10.62 x 1000/ÂµL — ABNORMAL HIGH (ref 2.00–7.60)
BKR WAM ANALYZER ANC: 7.83 x 1000/ÂµL — ABNORMAL HIGH (ref 2.00–7.60)
BKR WAM BASOPHIL ABSOLUTE COUNT.: 0.01 x 1000/ÂµL (ref 0.00–1.00)
BKR WAM BASOPHIL ABSOLUTE COUNT.: 0.02 x 1000/ÂµL (ref 0.00–1.00)
BKR WAM BASOPHILS: 0.1 % (ref 0.0–1.4)
BKR WAM BASOPHILS: 0.2 % (ref 0.0–1.4)
BKR WAM EOSINOPHIL ABSOLUTE COUNT.: 0.04 x 1000/ÂµL (ref 0.00–1.00)
BKR WAM EOSINOPHIL ABSOLUTE COUNT.: 0.02 x 1000/ÂµL (ref 0.00–1.00)
BKR WAM EOSINOPHILS: 0.3 % (ref 0.0–5.0)
BKR WAM EOSINOPHILS: 0.2 % (ref 0.0–5.0)
BKR WAM HEMATOCRIT (2 DEC): 41.4 % (ref 35.00–45.00)
BKR WAM HEMATOCRIT (2 DEC): 38.2 % (ref 35.00–45.00)
BKR WAM HEMOGLOBIN: 14.1 g/dL (ref 11.7–15.5)
BKR WAM HEMOGLOBIN: 13 g/dL (ref 11.7–15.5)
BKR WAM IMMATURE GRANULOCYTES: 0.3 % (ref 0.0–1.0)
BKR WAM IMMATURE GRANULOCYTES: 0.3 % (ref 0.0–1.0)
BKR WAM LYMPHOCYTES: 5 % — ABNORMAL LOW (ref 17.0–50.0)
BKR WAM LYMPHOCYTES: 12.7 % — ABNORMAL LOW (ref 17.0–50.0)
BKR WAM MCH (PG): 31.6 pg (ref 27.0–33.0)
BKR WAM MCH (PG): 31.8 pg (ref 27.0–33.0)
BKR WAM MCHC: 34.1 g/dL (ref 31.0–36.0)
BKR WAM MCHC: 34 g/dL (ref 31.0–36.0)
BKR WAM MCV: 92.8 fL (ref 80.0–100.0)
BKR WAM MCV: 93.4 fL (ref 80.0–100.0)
BKR WAM MONOCYTE ABSOLUTE COUNT.: 0.45 x 1000/ÂµL (ref 0.00–1.00)
BKR WAM MONOCYTE ABSOLUTE COUNT.: 0.47 x 1000/ÂµL (ref 0.00–1.00)
BKR WAM MONOCYTES: 3.8 % — ABNORMAL LOW (ref 4.0–12.0)
BKR WAM MONOCYTES: 4.9 % (ref 4.0–12.0)
BKR WAM MPV: 9.6 fL (ref 8.0–12.0)
BKR WAM MPV: 10.4 fL (ref 8.0–12.0)
BKR WAM NEUTROPHILS: 90.5 % — ABNORMAL HIGH (ref 39.0–72.0)
BKR WAM NEUTROPHILS: 81.7 % — ABNORMAL HIGH (ref 39.0–72.0)
BKR WAM NUCLEATED RED BLOOD CELLS: 0 % (ref 0.0–1.0)
BKR WAM NUCLEATED RED BLOOD CELLS: 0 % (ref 0.0–1.0)
BKR WAM PLATELETS: 195 x1000/ÂµL (ref 150–420)
BKR WAM PLATELETS: 186 x1000/ÂµL (ref 150–420)
BKR WAM RDW-CV: 12.7 % (ref 11.0–15.0)
BKR WAM RDW-CV: 12.7 % (ref 11.0–15.0)
BKR WAM RED BLOOD CELL COUNT.: 4.46 M/ÂµL (ref 4.00–6.00)
BKR WAM RED BLOOD CELL COUNT.: 4.09 M/ÂµL (ref 4.00–6.00)
BKR WAM WHITE BLOOD CELL COUNT: 11.8 x1000/ÂµL — ABNORMAL HIGH (ref 4.0–11.0)
BKR WAM WHITE BLOOD CELL COUNT: 9.6 x1000/ÂµL (ref 4.0–11.0)

## 2022-03-14 LAB — COMPREHENSIVE METABOLIC PANEL
BKR A/G RATIO: 1.3 (ref 1.0–2.2)
BKR ALANINE AMINOTRANSFERASE (ALT): 14 U/L (ref 10–35)
BKR ALBUMIN: 4 g/dL (ref 3.6–4.9)
BKR ALKALINE PHOSPHATASE: 60 U/L (ref 9–122)
BKR ANION GAP: 12 (ref 7–17)
BKR ASPARTATE AMINOTRANSFERASE (AST): 22 U/L (ref 10–35)
BKR AST/ALT RATIO: 1.6
BKR BILIRUBIN TOTAL: 0.4 mg/dL (ref <=1.2)
BKR BLOOD UREA NITROGEN: 11 mg/dL (ref 6–20)
BKR BUN / CREAT RATIO: 13.8 (ref 8.0–23.0)
BKR CALCIUM: 8.8 mg/dL (ref 8.8–10.2)
BKR CHLORIDE: 105 mmol/L (ref 98–107)
BKR CO2: 21 mmol/L (ref 20–30)
BKR CREATININE: 0.8 mg/dL (ref 0.40–1.30)
BKR EGFR, CREATININE (CKD-EPI 2021): >60 mL/min/{1.73_m2} (ref >=60)
BKR GLOBULIN: 3 g/dL (ref 2.3–3.5)
BKR GLUCOSE: 100 mg/dL (ref 70–100)
BKR POTASSIUM: 4.2 mmol/L (ref 3.3–5.3)
BKR PROTEIN TOTAL: 7 g/dL (ref 6.6–8.7)
BKR SODIUM: 138 mmol/L (ref 136–144)

## 2022-03-14 LAB — URINALYSIS WITH CULTURE REFLEX      (BH LMW YH)
BKR BILIRUBIN, UA: NEGATIVE
BKR BLOOD, UA: NEGATIVE
BKR GLUCOSE, UA: NEGATIVE
BKR LEUKOCYTE ESTERASE, UA: NEGATIVE
BKR NITRITE, UA: NEGATIVE
BKR PH, UA: 6.5 (ref 5.5–7.5)
BKR SPECIFIC GRAVITY, UA: 1.03 (ref 1.005–1.030)
BKR UROBILINOGEN, UA (MG/DL): <2 mg/dL (ref <=2.0)

## 2022-03-14 LAB — URINE MICROSCOPIC     (BH GH LMW YH)
BKR HYALINE CASTS, UA INSTRUMENT (NUMERIC): 1 /LPF (ref 0–3)
BKR RBC/HPF INSTRUMENT: 3 /HPF — ABNORMAL HIGH (ref 0–2)
BKR URINE SQUAMOUS EPITHELIAL CELLS, UA (NUMERIC): 8 /HPF — ABNORMAL HIGH (ref 0–5)
BKR WBC/HPF INSTRUMENT: 2 /HPF (ref 0–5)

## 2022-03-14 LAB — UA REFLEX CULTURE

## 2022-03-14 MED ORDER — HYDROMORPHONE 1 MG/ML INJECTION SYRINGE
1 mg/mL | Freq: Once | INTRAVENOUS | Status: CP
Start: 2022-03-14 — End: ?
  Administered 2022-03-14: 18:00:00 1 mL via INTRAVENOUS

## 2022-03-14 MED ORDER — SUCCINYLCHOLINE CHLOR 100 MG/5ML (20 MG/ML) IV SYRINGE (WRAPPED ERX)
100520 mg/5 mL (20 mg/mL) | INTRAVENOUS | Status: DC | PRN
Start: 2022-03-14 — End: 2022-03-14
  Administered 2022-03-14: 19:00:00 100 mg/5 mL (20 mg/mL) via INTRAVENOUS

## 2022-03-14 MED ORDER — MIDAZOLAM 1 MG/ML INJECTION SOLUTION
1 mg/mL | Status: CP
Start: 2022-03-14 — End: ?

## 2022-03-14 MED ORDER — MORPHINE 4 MG/ML INTRAVENOUS SOLUTION
4 mg/mL | Freq: Once | SUBCUTANEOUS | Status: CP
Start: 2022-03-14 — End: ?
  Administered 2022-03-14: 14:00:00 4 mL via SUBCUTANEOUS

## 2022-03-14 MED ORDER — ACETAMINOPHEN 1,000 MG/100 ML (10 MG/ML) INTRAVENOUS SOLUTION
10 mg/mL | INTRAVENOUS | Status: DC | PRN
Start: 2022-03-14 — End: 2022-03-14
  Administered 2022-03-14: 20:00:00 10 mg/mL via INTRAVENOUS

## 2022-03-14 MED ORDER — LACTATED RINGERS INTRAVENOUS SOLUTION
INTRAVENOUS | Status: DC | PRN
Start: 2022-03-14 — End: 2022-03-14
  Administered 2022-03-14: 19:00:00 via INTRAVENOUS

## 2022-03-14 MED ORDER — SODIUM CHLORIDE 0.9 % BOLUS (NEW BAG)
0.9 % | Freq: Once | INTRAVENOUS | Status: CP
Start: 2022-03-14 — End: ?
  Administered 2022-03-14: 13:00:00 0.9 mL/h via INTRAVENOUS

## 2022-03-14 MED ORDER — BUPIVACAINE (PF) 0.5 % (5 MG/ML) INJECTION SOLUTION
0.55 % (5 mg/mL) | Status: DC | PRN
Start: 2022-03-14 — End: 2022-03-14
  Administered 2022-03-14: 20:00:00 0.5 % (5 mg/mL)

## 2022-03-14 MED ORDER — LACTATED RINGERS INTRAVENOUS SOLUTION
INTRAVENOUS | Status: DC
Start: 2022-03-14 — End: 2022-03-15

## 2022-03-14 MED ORDER — FENTANYL (PF) 50 MCG/ML INJECTION SOLUTION
50 mcg/mL | Status: CP
Start: 2022-03-14 — End: ?

## 2022-03-14 MED ORDER — HYDROMORPHONE 2 MG/ML INJECTION SOLUTION
2 mg/mL | INTRAVENOUS | Status: DC | PRN
Start: 2022-03-14 — End: 2022-03-14
  Administered 2022-03-14 (×2): 2 mg/mL via INTRAVENOUS

## 2022-03-14 MED ORDER — ROCURONIUM 10 MG/ML INTRAVENOUS SOLUTION
10 mg/mL | INTRAVENOUS | Status: DC | PRN
Start: 2022-03-14 — End: 2022-03-14
  Administered 2022-03-14 (×2): 10 mg/mL via INTRAVENOUS

## 2022-03-14 MED ORDER — HYDROMORPHONE (PF) 0.2 MG/ML INJECTION SYRINGE
0.2 mg/mL | INTRAVENOUS | Status: DC | PRN
Start: 2022-03-14 — End: 2022-03-15
  Administered 2022-03-14: 22:00:00 0.2 mL via INTRAVENOUS

## 2022-03-14 MED ORDER — MIDAZOLAM 1 MG/ML INJECTION SOLUTION
1 mg/mL | INTRAVENOUS | Status: DC | PRN
Start: 2022-03-14 — End: 2022-03-14
  Administered 2022-03-14 (×2): 1 mg/mL via INTRAVENOUS

## 2022-03-14 MED ORDER — SODIUM CHLORIDE 0.9 % IRRIGATION SOLUTION
0.9 % irrigation | Status: CP | PRN
Start: 2022-03-14 — End: ?
  Administered 2022-03-14: 20:00:00 0.9 % irrigation

## 2022-03-14 MED ORDER — SODIUM CHLORIDE 0.9 % BOLUS (NEW BAG)
0.9 % | Freq: Once | INTRAVENOUS | Status: CP
Start: 2022-03-14 — End: ?
  Administered 2022-03-14: 18:00:00 0.9 mL/h via INTRAVENOUS

## 2022-03-14 MED ORDER — FENTANYL (PF) 50 MCG/ML INJECTION SOLUTION
50 mcg/mL | INTRAVENOUS | Status: DC | PRN
Start: 2022-03-14 — End: 2022-03-14
  Administered 2022-03-14 (×2): 50 mcg/mL via INTRAVENOUS

## 2022-03-14 MED ORDER — PROPOFOL 10 MG/ML INTRAVENOUS EMULSION
10 mg/mL | INTRAVENOUS | Status: DC | PRN
Start: 2022-03-14 — End: 2022-03-14
  Administered 2022-03-14: 19:00:00 10 mg/mL via INTRAVENOUS

## 2022-03-14 MED ORDER — HYDROMORPHONE 2 MG/ML INJECTION SOLUTION
2 mg/mL | Status: CP
Start: 2022-03-14 — End: ?

## 2022-03-14 MED ORDER — HYDROMORPHONE 0.5 MG/0.5 ML INJECTION SYRINGE
0.50.5 mg/ mL | INTRAVENOUS | Status: DC | PRN
Start: 2022-03-14 — End: 2022-03-15

## 2022-03-14 MED ORDER — MORPHINE 4 MG/ML INTRAVENOUS SOLUTION
4 mg/mL | Freq: Once | INTRAVENOUS | Status: CP
Start: 2022-03-14 — End: ?
  Administered 2022-03-14: 16:00:00 4 mL via INTRAVENOUS

## 2022-03-14 MED ORDER — FENTANYL (PF) 50 MCG/ML INJECTION SOLUTION
50 mcg/mL | INTRAVENOUS | Status: DC | PRN
Start: 2022-03-14 — End: 2022-03-15

## 2022-03-14 MED ORDER — THROMBIN(HUM-PLAS)-FIBRIN-CALC 500 UNIT-80 MG/ML (4ML) TOPICAL SYRINGE
500-804 unit-80 mg /mL (4 mL) | Status: DC | PRN
Start: 2022-03-14 — End: 2022-03-14
  Administered 2022-03-14: 20:00:00 500 unit-80 mg /mL (4 mL) via TOPICAL

## 2022-03-14 MED ORDER — SODIUM CHLORIDE 0.9 % (FLUSH) INJECTION SYRINGE
0.9 % | INTRAVENOUS | Status: DC | PRN
Start: 2022-03-14 — End: 2022-03-15

## 2022-03-14 MED ORDER — DEXMEDETOMIDINE 100 MCG/ML INTRAVENOUS SOLUTION
100 mcg/mL | INTRAVENOUS | Status: DC | PRN
Start: 2022-03-14 — End: 2022-03-14
  Administered 2022-03-14: 19:00:00 100 mcg/mL via INTRAVENOUS

## 2022-03-14 MED ORDER — OXYCODONE IMMEDIATE RELEASE 5 MG TABLET
5 mg | ORAL_TABLET | ORAL | 1 refills | Status: AC | PRN
Start: 2022-03-14 — End: ?

## 2022-03-14 MED ORDER — PROPOFOL 10 MG/ML INTRAVENOUS EMULSION
10 mg/mL | Status: DC | PRN
Start: 2022-03-14 — End: 2022-03-14
  Administered 2022-03-14: 19:00:00 10 mL/h

## 2022-03-14 MED ORDER — DEXAMETHASONE SODIUM PHOSPHATE 4 MG/ML INJECTION SOLUTION
4 mg/mL | INTRAVENOUS | Status: DC | PRN
Start: 2022-03-14 — End: 2022-03-14
  Administered 2022-03-14: 19:00:00 4 mg/mL via INTRAVENOUS

## 2022-03-14 MED ORDER — SUGAMMADEX 100 MG/ML INTRAVENOUS SOLUTION
100 mg/mL | Status: CP
Start: 2022-03-14 — End: ?

## 2022-03-14 MED ORDER — SODIUM CHLORIDE 0.9 % (FLUSH) INJECTION SYRINGE
0.9 % | Freq: Three times a day (TID) | INTRAVENOUS | Status: DC
Start: 2022-03-14 — End: 2022-03-15

## 2022-03-14 MED ORDER — DIMENHYDRINATE 5 MG/ML IN 0.9% SODIUM CHLORIDE
INTRAVENOUS | Status: DC | PRN
Start: 2022-03-14 — End: 2022-03-15

## 2022-03-14 MED ORDER — ONDANSETRON HCL (PF) 4 MG/2 ML INJECTION SOLUTION
42 mg/2 mL | INTRAVENOUS | Status: DC | PRN
Start: 2022-03-14 — End: 2022-03-14
  Administered 2022-03-14: 19:00:00 4 mg/2 mL via INTRAVENOUS

## 2022-03-14 MED ORDER — ONDANSETRON 4 MG DISINTEGRATING TABLET
4 mg | Freq: Once | ORAL | Status: CP
Start: 2022-03-14 — End: ?
  Administered 2022-03-14: 13:00:00 4 mg via ORAL

## 2022-03-14 MED ORDER — DROPERIDOL 2.5 MG/ML INJECTION SOLUTION
2.5 mg/mL | INTRAVENOUS | Status: DC | PRN
Start: 2022-03-14 — End: 2022-03-15

## 2022-03-14 MED ORDER — KETOROLAC 30 MG/ML (1 ML) INJECTION SOLUTION
301 mg/mL (1 mL) | INTRAVENOUS | Status: DC | PRN
Start: 2022-03-14 — End: 2022-03-14
  Administered 2022-03-14: 21:00:00 30 mg/mL (1 mL) via INTRAVENOUS

## 2022-03-14 MED ORDER — SUGAMMADEX 100 MG/ML INTRAVENOUS SOLUTION
100 mg/mL | INTRAVENOUS | Status: DC | PRN
Start: 2022-03-14 — End: 2022-03-14
  Administered 2022-03-14: 21:00:00 100 mg/mL via INTRAVENOUS

## 2022-03-14 MED ORDER — ACETAMINOPHEN 500 MG TABLET
500 mg | ORAL_TABLET | Freq: Four times a day (QID) | ORAL | 1 refills | Status: AC | PRN
Start: 2022-03-14 — End: ?

## 2022-03-14 MED ORDER — IBUPROFEN 600 MG TABLET
600 mg | ORAL_TABLET | Freq: Four times a day (QID) | ORAL | 1 refills | Status: AC | PRN
Start: 2022-03-14 — End: ?

## 2022-03-14 NOTE — ED Notes
10:39 AM 10:39 AMAmb bay called - [facility]. AOX4. Ambulatory. IV LOCK

## 2022-03-14 NOTE — Telephone Encounter
Calling patient crying saying she is and more pain, after the ultrasound pain medication is not working. Stated that they said that the ultrasound this time shows more cyst.

## 2022-03-14 NOTE — Other
Post Anesthesia Transfer of Care NotePatient: Mariah HoodProcedure(s) Performed: Procedure(s) (LRB):DIAGNOSTIC LAPAROSCOPY, LEFT OVARIAN CYSTECTOMY (N/A)Last Vitals: I have reviewed the post-operative vital signs during the handoff as noted in the Epic chart.POSTOP HANDOFF :      Patient Location:  PACU     Level of Consciousness:  Awake     VS stable since last recorded intra-op set? Yes       Oxygen source: maskPatient co-morbidities, intra-operative course, intake & output and antibiotics as per Anesthesia record were discussed with the RN.

## 2022-03-14 NOTE — ED Notes
7:19 AM Patient received Emesis (28 27year old female reports vomiting blood, + right lower abd pain x 9pm last night. Has hx of sepsis and ovarian cysts in past per patient. )9:17 AMBack from [place]

## 2022-03-14 NOTE — Other
Gynecology Consult Note Consult Information: Consultation requested by: Kem Boroughs, MDReason for consultation: r/o torsionPresentation History: HPI: Mariah Ferguson is a 27 y.o. G0 w/ hx PID, PCOS who presents as an ED transfer for r/o torsion. Patient had sudden onset pain at 0530 this morning associated with nausea, vomiting, subjective fevers. On arrival to ED, VS wnl. On exam had +LLQ w/ guarding, +L CVAT. W/u notable for Hgb 14.1, WBC 11.8 w/ left shift, -nitrites/leuks and rare bacteria on UA, and TVUS w/ L sided 4.9 cm hemorrhagic cyst (notably enlarged from last [place] on 2/11, at which point cyst was 3.4 cm) + increase in FF since time of last exam. While at [facility], required IV morphine 4mg  x1.At bedside, patient reports she started having abdominal bloating a few days ago but woke up this morning at 0530 with shooting LLQ pain associated with n/v. She has been writhing in pain since. Morphine has not helped. The pain is not positional. No LH, SOB. No recent intercourse. This pain feels very different than prior.Brief hx of patient's gynecologic issues:-06/2021: dx PID +trich, had L hematosalpinx on US-12/2021: 5.9cm R sided hemorrhagic cyst, redomenstration of hematosalpinx, was treated for PID although STI testing negative-2/10: started on loestrin for cyst prevention-2/11: TVUS L sided 3.4cm simple cystAdditionally, patient has a hx of AUB-? presumably due to PCOS. She had a withdrawal bleed after Provera challenge in January, but previously had skipped her period for several months. Review of Allergies/Medical History/Medications: Allergies:  No Known AllergiesPast Medical History: Past Medical History: Diagnosis Date ? Hydrosalpinx 01/17/2022  Left, inpt admission 06/2021 ? PCOS (polycystic ovarian syndrome) 01/17/2022  Multiple follicles seen on [place] ? PID (acute pelvic inflammatory disease)   06/2021 ? Pre-diabetes 01/24/2022  12/2021 HgbA1C 5.9% Repeat 69m, ordered ? Right ovarian cyst 01/17/2022  01/17/22 ED [place]: right adnexa demonstrates 5.9 x 5.2 x 4.8 cm multiloculated cystic structure that may represent a combination of hemorrhagic ovarian cysts versus a hemorrhagic ovarian cyst and hematosalpinx. Rpt [place] in 1 month [ ]  ordered  ? Trichomonal infection   06/2021 Past Surgical History:No past surgical history on file.Family History: No family history on file.Social History:Social History Socioeconomic History ? Marital status: Single   Spouse name: Not on file ? Number of children: Not on file ? Years of education: Not on file ? Highest education level: Not on file Occupational History ? Not on file Tobacco Use ? Smoking status: Not on file ? Smokeless tobacco: Not on file Substance and Sexual Activity ? Alcohol use: Not on file ? Drug use: Not on file ? Sexual activity: Not on file Other Topics Concern ? Not on file Social History Narrative ? Not on file Social Determinants of Health Financial Resource Strain: Low Risk  (06/24/2021)  Overall Financial Resource Strain (CARDIA)  ? Difficulty of Paying Living Expenses: Not very hard Food Insecurity: No Food Insecurity (06/24/2021)  Hunger Vital Sign  ? Worried About Programme researcher, broadcasting/film/video in the Last Year: Never true  ? Ran Out of Food in the Last Year: Never true Transportation Needs: No Transportation Needs (06/24/2021)  PRAPARE - Transportation  ? Lack of Transportation (Medical): No  ? Lack of Transportation (Non-Medical): No Physical Activity: Not on file Stress: Not on file Social Connections: Not on file Intimate Partner Violence: Not on file Housing Stability: Low Risk  (06/24/2021)  Housing Stability  ? Housing Stability: I have a steady place to live  ? Housing Stability: Not on file Prior to Admission Medications: (Not in  a hospital admission)Physical Exam: Vitals:Temp:  [98.1 ?F (36.7 ?C)-98.5 ?F (36.9 ?C)] 98.5 ?F (36.9 ?C)Pulse:  [86-89] 86Resp:  [16-22] 16BP: (108-138)/(68-82) 108/68SpO2:  [99 %-100 %] 100 %Intake/Output:Gross Totals (Last 24 hours) at 03/14/2022 1029Last data filed at 03/14/2022 0757Intake 1000 ml Output -- Net 1000 ml Physical Exam:Patient exam or treatment required medical chaperone.The sensitive parts of the examination were performed with chaperone present: Yes; Chaperone Name, Role/Title: ED providerGen- Uncomfortable appearingAbd- Severe TTP in LLQ, +voluntary guarding, -rebound/rigidityBimanual- globally TTP however worst in L adnexaReview of Labs/Diagnostics: Lab Review:Recent Labs Lab 02/21/240635 WBC 11.8* HGB 14.1 HCT 41.40 PLT 195  Recent Labs Lab 02/21/240635 NEUTROPHILS 90.5*  Recent Labs Lab 02/21/240635 NA 138 K 4.2 CL 105 CO2 21 BUN 11 CREATININE 0.80 GLU 100  Recent Labs Lab 02/21/240635 CALCIUM 8.8  Recent Labs Lab 02/21/240635 ALT 14 AST 22 ALKPHOS 60 BILITOT 0.4  No results for input(s): PTT, LABPROT, INR in the last 168 hours. Recent Labs Lab 02/21/240635 GLU 100   Diagnostic Review:Study: [place] NON-OB TRANSVAGINAL WITH LIMITED DOPPLER ([facility] LM WH)?Indication: worsening LLQ pain, recently dx w hemorrhagic cyst ro torsion?Comparison: TRANSVAGINAL ULTRASOUND 03/04/2022; TRANSVAGINAL ULTRASOUND 01/17/2022?Technique: Grayscale, color Doppler, and pulsed Doppler imaging was utilized.?Findings: ??The uterus measures 7.2 x 4.3 x 2.8 cm .  The endometrium measures 0.7 cm cm in thickness. There is increased vascularity throughout the uterus, and cervical motion tenderness is present.?Interval increase in the amount of free fluid seen in the cul-de-sac.?The right ovary measures 3.7 x 2.5 x 2.5 cm.  Color flow is demonstrated in the right ovary. A normal arterial waveform is seen, with a resistive index of 0.5 on pulsed wave Doppler. There is a 1.3 x 0.9 x 2.1 cm anechoic structure seen in the right adnexa, likely representing a paraovarian cyst (this appearance is unchanged from prior exam).  The left ovary measures 5.6 x 4.9 x 4.0 cm.  Color flow is demonstrated in the left ovary. A normal arterial waveform is seen, with a resistive index of 0.4 on pulsed wave Doppler. No adnexal masses are identified. Previously seen cyst is larger measuring 4.9 cm (increased from 3.4 cm) with some internal echoes which could represent some mild hemorrhage. There is a second elongated tubular structure next to the enlarged cyst with some mural nodularity/septations as annotated on image 79 frame 82 series 1 cine measuring approximately 2.6 cm which could be a dilated fallopian tube/hydrosalpinx.?Of note, the ovaries are close in proximity, this is similar to previous imaging.?Impression: ?1.         Asymmetrically enlarged left ovary with increasing size of the cyst which now contains some hemorrhage and a new anechoic structure which could be a dilated tube) and interval increase in pelvic free fluid. Spectral and Doppler flow is preserved in the parenchyma surrounding the are aforementioned cystic structures but the parenchyma may be slightly thickened/edematous (should be more compressed given the size of the cysts). Given the imaging findings and patient's significant pain, ovarian/tubal torsion cannot be excluded. Differential diagnosis includes infection.?Reported and signed by: Bobbie Stack, MD  [facility] Radiology and Biomedical ImagingImpression: Alexius Swinderman is a 27 y.o. G0 w/ hx PID, PCOS, hemorrhagic cysts who presents with acute onset LLQ pain. DDx ruptured hemorrhagic cyst rupture vs torsion vs infection. While clinical history more c/w ruptured hemorrhagic cyst, US findings of ovarian edema and dilated tube c/f ovarian versus tubal torsion. Recommend proceeding to OR for EUA, diagnostic laparoscopy, possible detorsion/cystectomy/oophorectomy/oophorpexy. Discussed risks of surgery with patient including bleeding,  infection, damage to surrounding structures, need for additional procedures, failure to improve pain. Discussed expected recovery course and anticipated discharge home following surgery. Case booked as level 1, will proceed to OR when all teams are ready.Recommendations: -To OR as aboveSeen and discussed with Dr. Russella Dar and Dr. Barnie Alderman.Signed: Melodie Bouillon, MDObstetrics and Gynecology2/21/202410:29 AM For questions please page: [phone] or call [phone]

## 2022-03-14 NOTE — Anesthesia Post-Procedure Evaluation
Anesthesia Post-op NotePatient: Mariah HoodProcedure(s):  Procedure(s) (LRB):DIAGNOSTIC LAPAROSCOPY, LEFT OVARIAN CYSTECTOMY (N/A) Last Vitals:  I have reviewed the post-operative vital signs as noted in the Epic chart.POSTOP EVALUATION:      Patient Recovery Location:  PACU     Vital Signs Status:  Stable     Patient Participation:  Patient participated     Mental Status:  Awake, alert and oriented     Respiratory Status:  Acceptable     Airway Patency:  Patent     Cardiovascular/Hydration Status:  Stable     Pain Management:  Satisfactory to patient     Nausea/Vomiting Status:  Satisfactory to patientThere were no known notable events for this encounter.

## 2022-03-14 NOTE — Telephone Encounter
TC to patient, currently in ED being evaluated for abdominal pain, being transferred to GYN service. Reports worsening pain and pressure. Concerned regarding ultrasound findings given GYN history and frustrated she was not initially seen at [facility]. Offered understanding of frustrations. Reassured patient she is in best place to be taken care of and that I will notify GYN team of her history, transfer. Advised to use call bell for RN if pain significantly worsening. Will plan for ED f/u at [facility] once discharged. Cari Caraway, CNM2/21/2024 11:24 AM

## 2022-03-14 NOTE — Discharge Instructions
During your surgery, we removed a 5 cm cyst from the left ovary. The cyst appeared to have partially ruptured which is likely what caused your pain. The ovary was not twisted. To help prevent cyst recurrence, please continue taking the Loestrin as prescribed.Post-Operative Instructions for Minor Laparoscopic SurgeryWhat to doRest: In the first day or two after the procedure you will need to rest, but you do not need to stay in bed.Pain: You may take ibuprofen (Motrin, Advil) 600 mg by mouth every 6 hours alternating with acetaminophen (Tylenol) 650 mg every 6 hours for pain, unless you have been told by a doctor not to take these medications. You may take oxycodone as needed for any pain that is unrelieved by ibuprofen and acetaminophen. After several days, you can instead take only as needed. Oxycodone can sometimes cause nausea; call your doctor if this is a problem for you. It can also make you drowsy; someone should be at home with you while you are taking this medication.Incision Care: Your incisions may be closed with dissolvable suture, skin glue, Steri-strips (small white bandages) and/or Band-aids. It is important to keep these areas clean and dry. It is normal for skin glue to flake off in the days following the surgery; try not to pick at the glue. You may remove Band-aids after 24 hours. It is normal for Steri-strips to peel off in the days following surgery; you can remove any Steri-strips remaining after 1 week. It is ok for Steri-strips to get wet in the shower.Constipation: Most patients feel gassy and constipated after laparoscopic surgery. Staying well-hydrated helps with constipation. You should take a stool softener such as docusate (Colace) starting the day of surgery. Gas medication such as simethicone (Gas-x) can also help with gas pains.Do not put anything in your vagina for 2 weeks (this includes tampons, vaginal intercourse, douching) because the risk of vaginal infections is higher after the procedure.Bathing: You may take a shower the day of your surgery, but do not sit in a tub or go swimming for 1 week after the procedure to reduce the risk of vaginal infections. When you are showering, let the soapy water run over your incisions but do not scrub the incisions. Pat your incisions dry when you are done showering.Lifting and exercise: Avoid strenuous physical activity (such as running) or lifting anything heavier than 20 lbs for the next two weeks. There is no restriction for climbing stairs. Listen to your body and avoid over-exerting yourself.Diet: You may have a regular diet. Try to drink lots of fluids such as water or Gatorade. Avoid alcoholic beverages on the day of the procedure (they can cause an adverse reaction in combination with the anesthesia you received) and while taking narcotics.Do not drive a car, use equipment that may cause injury, or make important decisions for at least 24 hours after you recover from sedation and while you are taking narcotics such as oxycodone.What to expectPain: Many women have tenderness at the sites of the laparoscopic incisions that can last for several days. You may also have soreness in your vagina or pelvic cramping similar to a period. Some pain and pressure with urinating is normal.Activity: Most women feel ready to return to their usual activities the week after the procedure.Vaginal Bleeding: You may have vaginal spotting for several weeks.Incision: It is common to have bruising around the incisions.Sore throat: This is common and is caused by the anesthesia used during the procedure. You can use throat lozenges or gargle with warm salt water to  help ease the discomfort.Gas: Most women feel bloated and gassy for the few days following the procedure. You may have shoulder pain, which can be caused by the gas in your abdomen from the procedure and from constipation. Warning signs: If you have any of these warning signs, call your doctorBleeding: Heavy bleeding is 2 soaked pads every hour for 2 hours in a row or 3 pads in one hour. Call if you: (1) have heavy bleeding, (2) are passing large blood clots (larger than a quarter), or (3) feel lightheaded, dizzy, or faint.Fever: A fever is a temperature above 100.4 deg F. It is not necessary to take your temperature regularly, but do check your temperature with a thermometer if you feel warm.Severe abdominal or back pain unrelieved by the medications recommended by your doctorFoul-smelling vaginal discharge Rash or hives Incision problems: leaking blood or pus, redness around the incisions, and a worsening of the tenderness of the incisions. Follow-Up: Arrange a follow-up appointment with your doctor or the office in 2-4 weeks. Please call the office if you have any concerns prior to your follow-up appointment.Follow-up is important because it will allow your provider to see how you are feeling after the procedure. It will also give you the chance to ask any questions you may have.During your follow-up visit, you will discuss the findings of the procedure and the results of any tests that were sent. If any further procedures are recommended, you will be able to schedule them at this time.

## 2022-03-14 NOTE — ED Notes
6:37 AM BIBA for the evaluation of L-Sided abdominal pain and emesis starting at around 0100 today. Patient reports hx of ovarian cyst w/ sepsis. Patient reports that she noted small amounts of blood in her emesis. Reports assosciated fevers and chills. States that she had an episode of incontinence while feeling ill. Denies chest pain or shortness of breath at this time. Chief Complaint Patient presents with  Emesis   27 year old female reports vomiting blood, + right lower abd pain x 9pm last night. Has hx of sepsis and ovarian cysts in past per patient.  7:05 AM Report given to oncoming RN. NAD noted during time of handoff. Pt. Resting w/ stretcher at lowest position and side rails upx2. All pt. Needs addressed during report.

## 2022-03-14 NOTE — Other
Operative Diagnosis:Pre-op:   Ovarian torsion [N83.519] Patient Coded Diagnosis   Pre-op diagnosis: Ovarian torsion  Post-op diagnosis: Ovarian torsion  Patient Diagnosis   Pre-op diagnosis: Ovarian torsion [N83.519]  Post-op diagnosis: Ovarian torsion [161096]    Post-op diagnosis:   * Ovarian torsion [N83.519]Operative Procedure(s) :Procedure(s) (LRB):DIAGNOSTIC LAPAROSCOPY, LEFT OVARIAN CYSTECTOMY (N/A)Post-op Procedure & Diagnosis ConfirmationPost-op Diagnosis: Post-op Diagnosis confirmed (no changes)Post-op Procedure: Post-op Procedure confirmed (no changes)

## 2022-03-14 NOTE — Anesthesia Pre-Procedure Evaluation
This is a 27 y.o. female scheduled for LAPAROSCOPY, SURGICAL; W/REMOVAL, ADNEXAL STRUCTURES.Review of Systems/ Medical HistoryPatient summary, pre-procedure vitals, height, weight and NPO status reviewed.Anesthesia Evaluation: Estimated body mass index is 18.16 kg/m as calculated from the following:  Height as of 03/03/22: 5' (1.524 m).  Weight as of 03/03/22: 42.2 kg. CC/HPI: 27 years old scheduled for LAPAROSCOPY, SURGICAL; W/REMOVAL, ADNEXAL STRUCTURES for ovarian torsion.Cardiovascular: Negative   -Exercise tolerance: >4 METS -Vascular Disease:  Negative   Respiratory:      -Cannabis use: yes (twice/week), smoking.HEENT: Negative.Neuromuscular: NegativeSkeletal/Skin:  NegativeGastrointestinal/Genitourinary: -Gastrointestinal Disorders:  Patient has GERD (with certain food).-Nutritional Disorders: Pt is cachectic per BMI definition, (BMI <20).Hematological/Lymphatic: NegativeEndocrine/Metabolic:  Negative.Additional Findings: Reports no problems related to anesthesia in the personal or family hxPhysical ExamCardiovascular:    Rhythm: regularPulmonary:  Patient's breath sounds clear to auscultationAirway:  Mallampati: IIITM distance: >3 FBNeck ROM: fullDental:  Dentition: missing and chippedAnesthesia PlanASA 2 - emergent The primary anesthesia plan is  general ETT. RSIAnesthesia informed consent obtained. Consent obtained from: patientThe post operative pain plan is per surgeon management.Plan discussed with SRNA.Anesthesiologist's Pre Op NoteI personally evaluated and examined the patient prior to the intra-operative phase of care on the day of the procedure.Marland Kitchen

## 2022-03-15 DIAGNOSIS — N838 Other noninflammatory disorders of ovary, fallopian tube and broad ligament: Secondary | ICD-10-CM

## 2022-03-15 DIAGNOSIS — N83202 Unspecified ovarian cyst, left side: Secondary | ICD-10-CM

## 2022-03-15 DIAGNOSIS — R64 Cachexia: Secondary | ICD-10-CM

## 2022-03-15 DIAGNOSIS — Z681 Body mass index (BMI) 19 or less, adult: Secondary | ICD-10-CM

## 2022-03-15 DIAGNOSIS — N8312 Corpus luteum cyst of left ovary: Secondary | ICD-10-CM

## 2022-03-15 NOTE — Other
[facility]	Operative ReportPatient Data:  Patient Name: Mariah Ferguson Age: 27 y.o. DOB: [birth year]	 MRN: [id number]	 DATE OF PROCEDURE/SURGERY:  2/21/2024OPERATION: Procedure(s) and Anesthesia Type:   * DIAGNOSTIC LAPAROSCOPY, LEFT OVARIAN CYSTECTOMY - GENERALSURGEONS: Surgeon(s) and Role:   * Kaysa Roulhac, Louanna Raw, MD - PrimaryOR STAFF: Circulator: Virl Diamond, RNRelief Circulator: Ozella Almond, RNScrub Person: Lazarus Salines, RN ANESTHESIA: General endotrachealINDICATIONS: 27 year old with 5cm L ovarian cyst and exam c/f torsionPREOP DIAGNOSIS: LLQ pain, ovarian cystPOSTOP DIAGNOSIS: SameESTIMATED BLOOD LOSS:  50ccFLUIDS REPLACED:  400 cc crystalloidCOMPLICATIONS:  NoneFINDINGS: -NEFG-Upper abdominal surgery notable for filmy adhesions between R lobe of liver and anterior abdominal wall, otherwise wnl-Normal appearing liver-Normal appearing right ovary, uterus, and left tube-Pelvic survey notable for simple with area of hemorrhagic 4-5 cm L ovarian cyst and 1 cm R paratubal cyst, otherwise wnl.  - No torsion noted.  Small amount of peritoneal fluid vs clear cystic fluid contents noted.  No obvious rupture site of cyst noted.  - no findings of endmoetriosis-vistaseal placed at ovarian bed with good hemostatic effectPROCEDURE: The patient was taken to the Operating Room where a time out was performed identifying the correct patient and procedure. General anesthesia was introduced without incident. The patient was placed in dorsal lithotomy with arms tucked bilateral with foam around her arms and hands.  Care taken to ensure she remained in a neurologically neutral position. An EUA was performed which noted the above findings. The patient was then prepped and draped in the usual sterile fashion. A surgical pause was performed, again confirming the correct patient and procedure. A foley catheter was placed. A sponge stick was placed into the vagina.Attention was turned to the abdomen. The umbilicus was everted with an allis clamp and two adairs placed bilaterally on the umbilical ring.  An 11 blade was used to vertically incise a 5mm opening at the base of the umbilicus. The abdominal wall was lifted with edairs to provide traction as the Veress needle was inserted, and the insufflation gas attached. Opening pressure was 0 mmHg. The abdomen was insufflated to 15 mmHg. The Veress needle was removed and a 5 mm trocar inserted optically. The camera was introduced and the intraperitoneal cavity visualized. The laparoscope confirmed atraumatic entry. Attention was turned to the RLQ area. After transilluminating and making sure there were no vessels in the intended site, 2 cc of Lidocaine were injected. An 11 blade was used to make a 5 mm incision. The Veress needle was introduced and its entry into the peritoneal cavity visualized. The trocar was inserted without complication, and the gas moved to this port. A 5 mm port in the LLQ was placed in a similar fashion. The patient was placed in Trendelenburg position and the cul-de-sac was visualized with the above findings.The insertion of the IP to the L ovary was identified. Starting on the contralateral aspect of the ovary, endoshears connected to monopolar energy were used to incise the ovarian stroma along the cyst surface. Blunt dissection used to peel cyst wall off ovary.  Spillage of cystic contents noted mixture of clear simple fluid and then some hemorrhagic component.The LLQ port trochar was removed and an 11 blade was used to extend the skin incision such that an 8mm trochar was able to be inserted under direct visualization. 5mm specimen bag inserted and used to remove specimen. The ovary was observed for hemostasis. Small oozing vessels at the base of the cyst bed were cauterized using endoshears connected to monopolar energy. Vista  seal applied. The abdomen was desufflated and allowed to rest for 2 minutes prior to reinsufflation to 15 mm Hg, at which point the operative sites were inspected and was found to have excellent hemostasis.  The pelvis suction irrigated.The 7-8 mm port removed under direct visualization.  The camera was then removed. The CO2 gas was disconnected, trocars removed, and the abdomen was desufflated. Five manual breaths were given to aid desufflluation.  The remaining ports removed over a blunt guide.  All laparoscopic incisions were closed with 4-0 Monocryl. Dermabond used to close lateral port sites and umbilical port site was covered with gauze and tegaderm. The spongestick was removed. The foley was removed. The patient tolerated the procedure well. Anesthesia was reversed, and the patient was transported to the PACU in stable condition. Dr. Barnie Alderman was present and scrubbed through the entire case.__________________________Nicole Teresa Pelton, MD PGY-2[employer]: [phone]/21/2024 4:19 PMAttending attestation:I have confirmed the above operative note.I was present and scrubbed throughout the entire procedure, and participated in all key portions._____________________Terri Barnie Alderman, MD2/22/202411:20 AM

## 2022-03-16 ENCOUNTER — Telehealth
Admit: 2022-03-16 | Payer: PRIVATE HEALTH INSURANCE | Attending: Obstetrics and Gynecology | Primary: Obstetrics and Gynecology

## 2022-03-16 NOTE — Telephone Encounter
[name] calling asking if theres anyway she can get a walker, she is having trouble getting up to use the bathroom its hard to get up and walk to places. Also her job is asking her for a note she's wondering if she gets it from [place] or [place].

## 2022-03-16 NOTE — Telephone Encounter
Pt calling,she had cyst removal 02/21 and asking if she should still take her bc pill.

## 2022-03-16 NOTE — Telephone Encounter
Calling back is now having heavy bleeding wondering if that's normal had surgery 2/21.

## 2022-03-16 NOTE — Telephone Encounter
Mariah Ferguson is a 27 year old G0 POD#2 s/p DIAGNOSTIC LAPAROSCOPY, LEFT OVARIAN CYSTECTOMY calling to report VB and pain. Pt reports reaching out since yesterday to OB/GYN who performed surgery on 2/21 without response. Confirmed # with pt- pt calling [phone]- pt states this was the contact number given to her. Pt a GYN pt of [facility] and reached out to [place] due to her VB and pain. Pt reports significant pain unrelieved with Oxycodone and Motrin Q4h. She reports the pain is so bad that she cannot get out of bed and reports that she has no family to help her. She states that she woke up from a nap and her family had left. Mariah Ferguson states the pain is very different today and reports the cramping and discomfort are too much.Pt reports VB that started this morning when she was in the BR. She reports her underwear had BRVB on it but clarified when she looked further that it was more of a darker red. She reports gooey, slimey droplets of blood coming out of her vagina. She is currently wearing a pad but does not feel like it is coming out and is struggling to get up to the BR at the moment. She is requesting a work note and a walker to help her get around as she cannot move well. She reports never getting up to the BR prior to d/c after procedure and feels as if she was d/c'd too soon. Confirm with pt that the amount of VB she is having is still WNL but that she should discuss this further with the surgeons who performed the surgery. Pt reports the d/c paperwork does not have any contact info. I myself, reached out to [facility] @ [phone] GNFAOZH YQMVH and left a message with the answering service for Mariah Ferguson, stating complaints of VB and severe pain post op. I called Mariah Ferguson back to let her know that I left a message for her with the surgeons office and that if she did not hear back by the end of the day that I wanted her to reach back out to [place]. I sympathized with Mariah Ferguson for her struggles with pain, VB and inability to get in contact with the correct provider. Mariah Ferguson expressed appreciation for [place] here at [facility] midwives and our attentiveness to her concerns. D/w CNM on call Fernand Parkins. Drea Denissa Cozart,RN2/23/202412:37 PM

## 2022-03-17 NOTE — ED Provider Notes
Chief Complaint Patient presents with ? Emesis   27 year old female reports vomiting blood, + right lower abd pain x 9pm last night. Has hx of sepsis and ovarian cysts in past per patient.  MDM ATTENDING ONLYPatient is a 27 y.o. female hx of PID c/b suspected Pyosalpinx, PCOS p/w  1 day of wrosening LLQ pain, recurrent nausea/vomiting, and urinary frequency/urgency. She had blood streaked emesis after recurrent episodes. She endorses subj fever, chills. She denies vaginal bleeding,vaginal discharge. She is not currently sexually active. Recently started on OCPs for PCOS management and reports compliance.. Exam notable for LLQ ttp w guarding, no hernia. + L CVA ttpPLANPresentation is most c/f UTI/Pyelo - will assess with UA. Consider worsening hemorrhagic cyst or complication - will obtain TVUS. Doubt PID/TOA. Will give IVF, pain management, antiememticRemaining Labs/Imaging as ordered See updates in comment section below DISPOSITIONTranfers [facility] Kem Boroughs, MDEmergency Medicine-------------------------------------------------------------------------------------------12:35 [facility] and evaluated by GYN. Patient still in severe pain, will order dilaudid 0.5mg  IV x 1 and get repeat CBC per GYN. Awaiting plan from GYN. Dispo: OR with GYNCase discussed with Dr. Elmer Picker. Mellody Dance, PA-CPlease see Amion for [facility] Physical ExamED Triage Vitals [03/14/22 0611]BP: 138/82Pulse: 89Pulse from  O2 sat: n/aResp: (!) 22Temp: 98.1 ?F (36.7 ?C)Temp src: OralSpO2: 99 % BP 112/76  - Pulse 79  - Temp 98.2 ?F (36.8 ?C) (Temporal)  - Resp 18  - LMP 01/21/2022  - SpO2 100% Physical Exam ProceduresAttestation/Critical CarePatient Reevaluation: ED Attestation: PA/APRNFace to face evaluation was performed by me in collaboration with the Advanced Practice Provider to assess for significant health threats. I provided a substantive portion of the care of this patient.? I personally performed medically appropriate history and physical exam.Patient received in transfer from outside hospital pending gyn evaluation after presenting with lower abdominal pain and concern for pelvic mass and possibility of torsion.  We will discuss further with gyn regarding disposition and plan of care.Paulita Cradle, MDEmergency MedicineComments as of 03/14/22 1336 Wed Mar 14, 2022 1115 Imaging reviewed and findings reveal enlarging cystic mass, increase FF and c/f ovarian edema despite maintained flow. I d/w Gyn team via phone and they agree pt will need bedside eval by them. I discussed with [facility] ED attending Dr Burgess Estelle to inform of transfer, pt remains HDS w intermittent pain. RN has book transport. Pt informed of findings and plan.  [SI]  Comments User Index[SI] Kem Boroughs, MD   Clinical Impressions as of 03/14/22 1336 Left ovarian cyst Free fluid in pelvis  ED DispositionSend To Or/Procedure Kem Boroughs, MD02/21/24 1118 Mellody Dance, PA02/21/24 1236 Mellody Dance, PA02/21/24 1336 Latrelle Dodrill, MD02/23/24 2151

## 2022-03-26 ENCOUNTER — Telehealth
Admit: 2022-03-26 | Payer: PRIVATE HEALTH INSURANCE | Attending: Obstetrics and Gynecology | Primary: Obstetrics and Gynecology

## 2022-03-26 NOTE — Telephone Encounter
Pt called looking for a work note for her previous surgery.

## 2022-03-28 ENCOUNTER — Encounter: Admit: 2022-03-28 | Payer: MEDICAID | Attending: Obstetrics and Gynecology | Primary: Obstetrics and Gynecology

## 2022-03-28 ENCOUNTER — Encounter
Admit: 2022-03-28 | Payer: PRIVATE HEALTH INSURANCE | Attending: Obstetrics and Gynecology | Primary: Obstetrics and Gynecology

## 2022-03-28 DIAGNOSIS — N83202 Unspecified ovarian cyst, left side: Secondary | ICD-10-CM

## 2022-03-28 DIAGNOSIS — R7303 Prediabetes: Secondary | ICD-10-CM

## 2022-03-28 DIAGNOSIS — N7011 Chronic salpingitis: Secondary | ICD-10-CM

## 2022-03-28 DIAGNOSIS — N83201 Unspecified ovarian cyst, right side: Secondary | ICD-10-CM

## 2022-03-28 DIAGNOSIS — E282 Polycystic ovarian syndrome: Secondary | ICD-10-CM

## 2022-04-01 NOTE — Progress Notes
[facility] MidwivesProblem VisitDate: 03/28/2022 				No LMP recorded.:	Vitals:  03/28/22 1024 BP: 100/60 Weight: 41.3 kg Weight 91 lbs.Chief Concern:Mariah Ferguson is 27 y.o. G0P0000 presenting for follow up after emergent ovarian cystectomy 03/14/22. There was concern for torsion, but this was not found. Since surgery, has been feeling much better. Her pain is gone, which is a huge relief. Feels like a new person! Wonders if it is ok to return to work.She was also Rx'd COCPs 6 weeks ago r/t irregular cycles likely r/t PCOS. She had not had a spontaneous period since end of September. Presumed cause is PCOS (evidence of which was seen on [place]). She did a Provera challenge and did get a withdrawal bleed. Went for blood work I had ordered, which was normal aside from A1C 5.9. We tried to order a chewable pill, but this was not covered by insurance. Because of the insurance delay, she started the OCP a couple of weeks prior to her surgery and resumed it after. She has not had a hard time swallowing the pill.Contributing History:Menstrual hx:  see aboveSexual activity / Partners: not SAContraception: Not SA, on COCPGeneral: Appears well, well developed, well nourished and in no acute distressAbdomen:Laparoscopy sites C/D/I with no s/sx infectionAssessment:Pelvic pain resolved after ovarian cystectomyNo period Sept-Dec, got bleed after Provera challenge, presumed cause PCOSPre-diabetic A1C, working on dietary changes and stress reduction Plan:        Has post op appt scheduled                                                                                            Continue COCP as advisedCommended healthy lifestyle changesRTO annual and PRNKristin Benjaman Kindler, CNM3/10/2024Patient Active Problem List  Diagnosis   Pre-diabetes    12/2021 HgbA1C 5.9%Reviewed lifestyle changesRepeat 73m, ordered  Hydrosalpinx    Left, inpt admission 06/2021  Right ovarian cyst    01/17/22 ED [place]: right adnexa demonstrates 5.9 x 5.2 x 4.8 cm multiloculated cystic structure that may represent a combination of hemorrhagic ovarian cysts versus a hemorrhagic ovarian cyst and hematosalpinx.Rpt [place] in 1 month ordered: done in ED 2/10 when presented with pain: Previously seen likely right hemorrhagic cyst is now significantly decreased in size, measuring 1.4 x 1.4 x 1.4 cm  PCOS (polycystic ovarian syndrome)    Multiple follicles seen on USMinastrin Rx'd 01/2022 ALLERGIES: She has No Known Allergies. PAST MEDICAL HISTORY: She  has a past medical history of Hydrosalpinx (01/17/2022), PCOS (polycystic ovarian syndrome) (01/17/2022), PID (acute pelvic inflammatory disease), Pre-diabetes (01/24/2022), Right ovarian cyst (01/17/2022), and Trichomonal infection. PAST SURGICAL HISTORY: She  has no past surgical history on file. PAST OBSTETRICAL HISTORY: OB History   Gravida 0  Para 0  Term 0  Preterm 0  AB 0  Living 0   SAB 0  IAB 0  Ectopic 0  Molar 0  Multiple 0  Live Births 0    FAMILY HISTORY: Her family history is not on file. SOCIAL HISTORY: She   MEDICATIONS: She has a current medication list which includes the following prescription(s): ibuprofen (ADVIL,MOTRIN) 600 mg tablet - Take 1 tablet (600 mg total) by mouth every 6 (six)  hours as needed for pain, ibuprofen (ADVIL,MOTRIN) 600 mg tablet - Take 1 tablet (600 mg total) by mouth every 6 (six) hours as needed for pain, norethindrone-e.estradioL-iron (LOESTRIN 24 FE) 1 mg-20 mcg (24)/75 mg (4) tablet - Take 1 tablet by mouth daily, and oxyCODONE (ROXICODONE) 5 mg Immediate Release tablet - Take 1 tablet (5 mg total) by mouth every 4 (four) hours as needed for pain.

## 2022-06-14 ENCOUNTER — Emergency Department: Admit: 2022-06-14 | Payer: MEDICAID | Primary: Obstetrics and Gynecology

## 2022-06-14 ENCOUNTER — Inpatient Hospital Stay: Admit: 2022-06-14 | Discharge: 2022-06-14 | Payer: MEDICAID | Attending: Emergency Medicine

## 2022-06-14 DIAGNOSIS — R1032 Left lower quadrant pain: Secondary | ICD-10-CM

## 2022-06-14 LAB — COMPREHENSIVE METABOLIC PANEL
BKR A/G RATIO: 1.3 (ref 1.0–2.2)
BKR ALANINE AMINOTRANSFERASE (ALT): 15 U/L (ref 10–35)
BKR ALBUMIN: 4.4 g/dL (ref 3.6–5.1)
BKR ALKALINE PHOSPHATASE: 65 U/L (ref 9–122)
BKR ANION GAP: 12 g/dL (ref 7–17)
BKR ASPARTATE AMINOTRANSFERASE (AST): 28 U/L (ref 10–35)
BKR AST/ALT RATIO: 1.9
BKR BILIRUBIN TOTAL: 0.3 mg/dL (ref <=1.2)
BKR BLOOD UREA NITROGEN: 12 mg/dL (ref 6–20)
BKR BUN / CREAT RATIO: 14.6 (ref 8.0–23.0)
BKR CALCIUM: 9.6 mg/dL (ref 8.8–10.2)
BKR CHLORIDE: 101 mmol/L (ref 98–107)
BKR CO2: 25 mmol/L (ref 20–30)
BKR CREATININE: 0.82 mg/dL (ref 0.40–1.30)
BKR EGFR, CREATININE (CKD-EPI 2021): >60 mL/min/{1.73_m2} (ref >=60–7.60)
BKR GLOBULIN: 3.5 g/dL (ref 2.0–3.9)
BKR GLUCOSE: 96 mg/dL (ref 70–100)
BKR POTASSIUM: 4.1 mmol/L (ref 3.3–5.3)
BKR PROTEIN TOTAL: 7.9 g/dL (ref 5.9–8.3)
BKR SODIUM: 138 mmol/L (ref 136–144)

## 2022-06-14 LAB — CBC WITH AUTO DIFFERENTIAL
BKR WAM ABSOLUTE IMMATURE GRANULOCYTES.: 0.05 x 1000/ÂµL (ref 0.00–0.30)
BKR WAM ABSOLUTE LYMPHOCYTE COUNT.: 1.88 x 1000/ÂµL (ref 0.60–3.70)
BKR WAM ABSOLUTE NRBC (2 DEC): 0 x 1000/ÂµL (ref 0.00–1.00)
BKR WAM ANALYZER ANC: 7.25 x 1000/ÂµL (ref 2.00–7.60)
BKR WAM BASOPHIL ABSOLUTE COUNT.: 0.02 x 1000/ÂµL (ref 0.00–1.00)
BKR WAM BASOPHILS: 0.2 % (ref 0.0–1.4)
BKR WAM EOSINOPHIL ABSOLUTE COUNT.: 0.05 x 1000/ÂµL (ref 0.00–1.00)
BKR WAM EOSINOPHILS: 0.5 % — AB (ref 0.0–5.0)
BKR WAM HEMATOCRIT (2 DEC): 44.1 % (ref 35.00–45.00)
BKR WAM HEMOGLOBIN: 14.9 g/dL (ref 11.7–15.5)
BKR WAM IMMATURE GRANULOCYTES: 0.5 % (ref 0.0–1.0)
BKR WAM LYMPHOCYTES: 18.7 % (ref 17.0–50.0)
BKR WAM MCH (PG): 32.3 pg (ref 27.0–33.0)
BKR WAM MCHC: 33.8 g/dL — ABNORMAL HIGH (ref 31.0–36.0)
BKR WAM MCV: 95.5 fL (ref 80.0–100.0)
BKR WAM MONOCYTE ABSOLUTE COUNT.: 0.8 x 1000/ÂµL (ref 0.00–1.00)
BKR WAM MONOCYTES: 8 % (ref 4.0–12.0)
BKR WAM MPV: 10.4 fL (ref 8.0–12.0)
BKR WAM NEUTROPHILS: 72.1 % — ABNORMAL HIGH (ref 39.0–72.0)
BKR WAM NUCLEATED RED BLOOD CELLS: 0 % (ref 0.0–1.0)
BKR WAM PLATELETS: 228 x1000/ÂµL (ref 150–420)
BKR WAM RDW-CV: 12.1 % (ref 11.0–15.0)
BKR WAM RED BLOOD CELL COUNT.: 4.62 M/ÂµL (ref 4.00–6.00)
BKR WAM WHITE BLOOD CELL COUNT: 10.1 x1000/ÂµL (ref 4.0–11.0)

## 2022-06-14 LAB — URINALYSIS WITH CULTURE REFLEX      (BH LMW YH)
BKR BILIRUBIN, UA: NEGATIVE
BKR BLOOD, UA: NEGATIVE
BKR GLUCOSE, UA: NEGATIVE
BKR NITRITE, UA: NEGATIVE
BKR PH, UA: 6.5 (ref 5.5–7.5)
BKR SPECIFIC GRAVITY, UA: 1.031 — ABNORMAL HIGH (ref 1.005–1.030)
BKR UROBILINOGEN, UA (MG/DL): <2 mg/dL (ref <=2.0)

## 2022-06-14 LAB — URINE MICROSCOPIC     (BH GH LMW YH)
BKR HYALINE CASTS, UA INSTRUMENT (NUMERIC): 1 /LPF — AB (ref 0–3)
BKR RBC/HPF INSTRUMENT: 2 /HPF (ref 0–2)
BKR URINE SQUAMOUS EPITHELIAL CELLS, UA (NUMERIC): 3 /HPF (ref 0–5)
BKR WBC/HPF INSTRUMENT: 1 /HPF (ref 0–5)

## 2022-06-14 LAB — UA REFLEX CULTURE

## 2022-06-14 MED ORDER — ONDANSETRON 4 MG DISINTEGRATING TABLET
4 mg | Freq: Once | Status: CP
Start: 2022-06-14 — End: ?
  Administered 2022-06-15: 4 mg

## 2022-06-14 NOTE — ED Notes
Chief Complaint Patient presents with  Abdominal Pain  Emesis 90:5 PM 27 year old female presents to the

## 2022-06-15 ENCOUNTER — Emergency Department: Admit: 2022-06-15 | Payer: MEDICAID | Primary: Obstetrics and Gynecology

## 2022-06-15 ENCOUNTER — Inpatient Hospital Stay: Admit: 2022-06-15 | Discharge: 2022-06-15 | Payer: MEDICAID | Attending: Emergency Medicine

## 2022-06-15 DIAGNOSIS — R1110 Vomiting, unspecified: Secondary | ICD-10-CM

## 2022-06-15 DIAGNOSIS — Z5320 Procedure and treatment not carried out because of patient's decision for unspecified reasons: Secondary | ICD-10-CM

## 2022-06-15 DIAGNOSIS — Z8742 Personal history of other diseases of the female genital tract: Secondary | ICD-10-CM

## 2022-06-15 DIAGNOSIS — N83202 Unspecified ovarian cyst, left side: Secondary | ICD-10-CM

## 2022-06-15 DIAGNOSIS — R102 Pelvic and perineal pain: Secondary | ICD-10-CM

## 2022-06-15 DIAGNOSIS — R112 Nausea with vomiting, unspecified: Secondary | ICD-10-CM

## 2022-06-15 LAB — CBC WITH AUTO DIFFERENTIAL
BKR WAM ABSOLUTE IMMATURE GRANULOCYTES.: 0.03 x 1000/ÂµL (ref 0.00–0.30)
BKR WAM ABSOLUTE LYMPHOCYTE COUNT.: 2.03 x 1000/ÂµL (ref 0.60–3.70)
BKR WAM ABSOLUTE NRBC (2 DEC): 0 x 1000/ÂµL (ref 0.00–1.00)
BKR WAM ANALYZER ANC: 6.07 x 1000/ÂµL (ref 2.00–7.60)
BKR WAM BASOPHIL ABSOLUTE COUNT.: 0.02 x 1000/ÂµL (ref 0.00–1.00)
BKR WAM BASOPHILS: 0.2 % (ref 0.0–1.4)
BKR WAM EOSINOPHIL ABSOLUTE COUNT.: 0.05 x 1000/ÂµL (ref 0.00–1.00)
BKR WAM EOSINOPHILS: 0.6 % (ref 0.0–5.0)
BKR WAM HEMATOCRIT (2 DEC): 42.7 % (ref 35.00–45.00)
BKR WAM HEMOGLOBIN: 14.3 g/dL (ref 11.7–15.5)
BKR WAM IMMATURE GRANULOCYTES: 0.3 % (ref 0.0–1.0)
BKR WAM LYMPHOCYTES: 22.5 % (ref 17.0–50.0)
BKR WAM MCH (PG): 31.6 pg (ref 27.0–33.0)
BKR WAM MCHC: 33.5 g/dL (ref 31.0–36.0)
BKR WAM MCV: 94.5 fL (ref 80.0–100.0)
BKR WAM MONOCYTE ABSOLUTE COUNT.: 0.82 x 1000/ÂµL (ref 0.00–1.00)
BKR WAM MONOCYTES: 9.1 % (ref 4.0–12.0)
BKR WAM MPV: 9.3 fL (ref 8.0–12.0)
BKR WAM NEUTROPHILS: 67.3 % (ref 39.0–72.0)
BKR WAM NUCLEATED RED BLOOD CELLS: 0 % (ref 0.0–1.0)
BKR WAM PLATELETS: 213 x1000/ÂµL (ref 150–420)
BKR WAM RDW-CV: 11.9 % (ref 11.0–15.0)
BKR WAM RED BLOOD CELL COUNT.: 4.52 M/ÂµL (ref 4.00–6.00)
BKR WAM WHITE BLOOD CELL COUNT: 9 x1000/ÂµL (ref 4.0–11.0)

## 2022-06-15 LAB — URINALYSIS WITH CULTURE REFLEX      (BH LMW YH)
BKR BILIRUBIN, UA: NEGATIVE
BKR GLUCOSE, UA: NEGATIVE
BKR LEUKOCYTE ESTERASE, UA: NEGATIVE
BKR NITRITE, UA: NEGATIVE
BKR PH, UA: 6 (ref 5.5–7.5)
BKR SPECIFIC GRAVITY, UA: 1.035 — ABNORMAL HIGH (ref 1.005–1.030)
BKR UROBILINOGEN, UA (MG/DL): <2 mg/dL (ref <=2.0)

## 2022-06-15 LAB — BASIC METABOLIC PANEL
BKR ANION GAP: 12 (ref 7–17)
BKR BLOOD UREA NITROGEN: 12 mg/dL (ref 6–20)
BKR BUN / CREAT RATIO: 13.3 (ref 8.0–23.0)
BKR CALCIUM: 8.8 mg/dL (ref 8.8–10.2)
BKR CHLORIDE: 102 mmol/L (ref 98–107)
BKR CO2: 24 mmol/L (ref 20–30)
BKR CREATININE: 0.9 mg/dL (ref 0.40–1.30)
BKR EGFR, CREATININE (CKD-EPI 2021): >60 mL/min/{1.73_m2} (ref >=60)
BKR GLUCOSE: 95 mg/dL (ref 70–100)
BKR POTASSIUM: 4.3 mmol/L (ref 3.3–5.3)
BKR SODIUM: 138 mmol/L (ref 136–144)

## 2022-06-15 LAB — URINE MICROSCOPIC     (BH GH LMW YH)
BKR RBC/HPF INSTRUMENT: 4 /HPF — ABNORMAL HIGH (ref 0–2)
BKR URINE SQUAMOUS EPITHELIAL CELLS, UA (NUMERIC): 18 /HPF — ABNORMAL HIGH (ref 0–5)
BKR WBC/HPF INSTRUMENT: 5 /HPF (ref 0–5)

## 2022-06-15 LAB — UA REFLEX CULTURE

## 2022-06-15 LAB — URINE CULTURE

## 2022-06-15 MED ORDER — SODIUM CHLORIDE 0.9 % BOLUS (NEW BAG)
0.9 % | Freq: Once | INTRAVENOUS | Status: CP
Start: 2022-06-15 — End: ?
  Administered 2022-06-15: 14:00:00 0.9 mL/h via INTRAVENOUS

## 2022-06-15 MED ORDER — KETOROLAC 30 MG/ML (1 ML) INJECTION SOLUTION
301 mg/mL (1 mL) | Freq: Once | INTRAMUSCULAR | Status: CP
Start: 2022-06-15 — End: ?
  Administered 2022-06-15: 17:00:00 30 mL via INTRAMUSCULAR

## 2022-06-15 MED ORDER — OXYCODONE IMMEDIATE RELEASE 5 MG TABLET
5 mg | ORAL_TABLET | Freq: Four times a day (QID) | ORAL | 1 refills | Status: SS | PRN
Start: 2022-06-15 — End: 2022-06-17

## 2022-06-15 MED ORDER — ONDANSETRON HCL (PF) 4 MG/2 ML INJECTION SOLUTION
42 mg/2 mL | Freq: Once | INTRAVENOUS | Status: CP
Start: 2022-06-15 — End: ?
  Administered 2022-06-15: 14:00:00 4 mL via INTRAVENOUS

## 2022-06-15 NOTE — Discharge Instructions
Dear Mariah Ferguson was a pleasure taking care of you at [facility]. Keep in mind that mild or early diseases may not be as obvious to [place], so please return to the emergency department immediately if you have any questions or develop new, worsening, or otherwise concerning symptoms.  Also, just because we did not identify a life threat during your evaluation, does not mean that there are no further steps to working up your symptoms.  TO UJW:JXBJYN touch base with your primary care team in the next 12-18 hours for follow-up without fail.  If you do not have a primary provider, please use the number provided to contact one of our clinics or return to the ED for reevaluation without fail within 12-18 hours.Please refer to your Medication Reconciliation for a full list of your medications.We are always open. Val Riles, M.D. on behalf of your care teamExample over-the-counter pain/fever regimen:Acetaminophen		(e.g. Tylenol)		975-1000mg  by mouth every 6 hours as neededIbuprofen 		(e.g. Motrin)		600mg  by mouth every 6 hours as neededDo not take any medication that you are allergic to. Always take the lowest effective dose (use the above as a maximum dosing).  Keep in mind that all medications have side effects.    Please read all labels of medications to familiarize yourself with medications before taking them.  If you think you may be experiencing a side effect, please call your doctor immediately or return to the emergency department.'

## 2022-06-15 NOTE — ED Provider Notes
Chief Complaint Patient presents with  Abdominal Pain  Emesis HPI/PE:27 year old female with a prior medical history is significant for polycystic ovarian syndrome and a hemorrhagic 4-5 cm L ovarian cyst and 1 cm R paratubal cyst found during diagnostic laparoscopic performed due to concern for ovarian torsion on 03/14/2022 presents with left lower quadrant abdominal pain with nausea and vomiting for 2 days.  The patient states that the pain is similar to the pain she experienced during her last admission for concern for ovarian torsion.  She is not been able to keep down any food or water. The patient denies any changes with bowel movements or micturition.  She denies any chest pain.[id number] diagnosis:  Ovarian torsionPlan:- transvaginal pelvic ultrasound with Doppler- Patient eloped  Physical ExamED Triage Vitals [06/14/22 1623]BP: 123/78Pulse: 71Pulse from  O2 sat: n/aResp: 17Temp: 97.9 ?F (36.6 ?C)Temp src: TemporalSpO2: 100 % BP 132/85  - Pulse 85  - Temp 97.9 ?F (36.6 ?C) (Temporal)  - Resp 16  - SpO2 100% Physical ExamConstitutional:     Appearance: She is well-developed. Cardiovascular:    Rate and Rhythm: Normal rate. Pulmonary:    Effort: Pulmonary effort is normal. Abdominal:    General: Abdomen is flat. A surgical scar is present.    Palpations: Abdomen is soft.    Tenderness: There is abdominal tenderness in the left lower quadrant. Neurological:    Mental Status: She is alert.  ProceduresAttestation/Critical CareComments as of 06/15/22 1619 Thu Jun 14, 2022 1950 Attending Supervised: ResidentI saw and examined the patient. I agree with the findings and plan of care as documented in the resident's note except as noted below. Additional acute and/or chronic problems addressed: 27 year old female with a history of ovarian cyst presents with persistent nausea and vomiting since yesterday.  She also reports ongoing fairly chronic left lower quadrant abdominal pain.  States pain has been fairly persistent for the past year with a history of ovarian cyst with consideration for torsion status post surgery.  She states ongoing near daily pain.  This is not particularly worse than usual.  Denies any vaginal bleeding or discharge.  LMP recent normal.  Preceding known sick contacts, suspect food intake, fever or chills.We will proceed with lab for blood counts electrolytes to include UA, hCG.  Consideration for repeat ultrasound.Latrelle Dodrill, MD [JC]  Comments User Index[JC] Latrelle Dodrill, MD   Clinical Impressions as of 06/15/22 1619 Vomiting, unspecified vomiting type, unspecified whether nausea present Pelvic pain  ED DispositionEloped Lennie Muckle, MDResident05/23/24 1919 Lennie Muckle, MDResident05/23/24 2204 Paulita Cradle B, MD05/24/24 [phone]

## 2022-06-15 NOTE — ED Notes
8:02 PM PATIENT REPORTS COMING IN TODAY AS I HAVEN'T BEEN ABLE TO KEEP ANYTHING DOWN AND I'M FEELING DEHYDRATED. ENDORSES CRAMPING ABDOMINAL PAIN N/ VX2 STARTING YESTERDAY. PATIENT A&OX4, GCS 15, BREATHS ARE EVEN AND UNLABOURED, SPEAKING IN FULL SENTANCES, IS AMBULATORY, ABC'S INTACT. DENIES CONSTIPATION/DIARRHEA. Chief Complaint Patient presents with  Abdominal Pain  Emesis Past Medical History: Diagnosis Date  Hydrosalpinx 01/17/2022  Left, inpt admission 06/2021  PCOS (polycystic ovarian syndrome) 01/17/2022  Multiple follicles seen on [place]  PID (acute pelvic inflammatory disease)   06/2021  Pre-diabetes 01/24/2022  12/2021 HgbA1C 5.9% Repeat 44m, ordered  Right ovarian cyst 01/17/2022  01/17/22 ED [place]: right adnexa demonstrates 5.9 x 5.2 x 4.8 cm multiloculated cystic structure that may represent a combination of hemorrhagic ovarian cysts versus a hemorrhagic ovarian cyst and hematosalpinx. Rpt [place] in 1 month [ ]  ordered   Trichomonal infection   06/2021

## 2022-06-15 NOTE — ED Provider Notes
Chief Complaint Patient presents with  Emesis   N/v x 3 days- was seen at [facility] yesterday and was supposed to wait for an ultrasound, but left AMA. Report no relief of symptoms. Hx of PCOS. Denies any abdominal pain. Reports inflammation like I might have a UTI. Unable to keep anything down. An acute or life threatening problem was considered during this evaluation  External data reviewed: Notes (OSH or non-ED)  Physical ExamED Triage Vitals [06/15/22 0835]BP: 124/75Pulse: 90Pulse from  O2 sat: n/aResp: 18Temp: 98.3 ?F (36.8 ?C)Temp src: OralSpO2: 98 % BP 124/75  - Pulse 90  - Temp 98.3 ?F (36.8 ?C) (Oral)  - Resp 18  - SpO2 98% Physical Exam ProceduresAttestation/Critical CarePatient Reevaluation: 27 year old female history of chronic left lower quadrant abdominal pain, remote history of left ovarian cyst removal, intermittent chronic nausea and vomiting here with acute worsening of her nausea and vomiting.  Reports her abdominal pain is not particularly worse.  She is concerned she may have a UTI.  She was seen for the same yesterday but left before her workup was complete.On exam:  Negative Murphy, negative McBurney.  Moist mucous membranes.  Well-appearing.  Mild left lower quadrant abdominal tenderness without rebound.  No CVA tenderness.I considered an acute or life-threatening diagnosis during this encounter.I reviewed previous notes if availableConsidered ovarian cyst, ordered transvaginal ultrasound.Considered hospitalization  Otherwise provided routine care.Ferdinand Cava MDClinical Impressions as of 06/15/22 1538 Ovarian cyst, left  ED DispositionDischarge Scherrie Bateman, MD05/24/24 1538

## 2022-06-15 NOTE — ED Notes
9:25 AM Pt from walk in with a cc ofChief Complaint Patient presents with  Emesis   N/v x 3 days- was seen at [facility] yesterday and was supposed to wait for an ultrasound, but left AMA. Report no relief of symptoms. Hx of PCOS. Denies any abdominal pain. Reports inflammation like I might have a UTI. Unable to keep anything down. U preg negative. PIV in place, pending imaging. Pt reports LLQ pain.12:27 PM Pt up for discharge. Apprears to be in a lot of pain. Pending provider re-evaluation.

## 2022-06-15 NOTE — Care Coordination-Inpatient
Pt eloped from the ED. Chart reviewed in ED follow up, pt is currently back in the ED.

## 2022-06-15 NOTE — ED Notes
9:20 PM PATIENT EXPRESSES DESIRE TO LEAVE AS ULTRASOUND IS TAKING TOO LONG. PATIENT INFORMED THAT SHE WILL BE GOING TO [place] SHORTLY AND PATIENT STATES SHE WILL STAY FOR NOW. PATIENT ADDITIONALLY INSTRUCTED TO INFORM THIS NURSE IF SHE WISHES TO LEAVE. PATIENT STATED OK. 09:47 PM [place] CALLED AND INFORMED THIS RN THAT THEY ARE READY FOR PATIENT. PATIENT NOT IN ROOM OR BATHROOM OR ON UNIT. PER STAFF MEMBER BY ENTRYWAY, PATIENT AMBULATED OUT. PROVIDER NOTIFIED. 9:58 PM CALLED AND SPOKE WITH MOTHER WHO STATES SHE IS WITH PATIENT. PATIENT STATES THEY TOOK AN UBER HOME AND DO NOT HAVE AN IV IN PLACE. ATTENDING MD CONFIRMS THAT THEY DID NOT HAVE IV PLACED EARLIER DURING SHIFT. PATIENT ELOPED.

## 2022-06-16 ENCOUNTER — Inpatient Hospital Stay
Admit: 2022-06-16 | Discharge: 2022-06-17 | Payer: MEDICAID | Attending: Obstetrics and Gynecology | Primary: Obstetrics and Gynecology

## 2022-06-16 ENCOUNTER — Encounter: Admit: 2022-06-16 | Payer: PRIVATE HEALTH INSURANCE | Primary: Obstetrics and Gynecology

## 2022-06-16 ENCOUNTER — Emergency Department: Admit: 2022-06-16 | Payer: MEDICAID | Primary: Obstetrics and Gynecology

## 2022-06-16 DIAGNOSIS — N83201 Unspecified ovarian cyst, right side: Secondary | ICD-10-CM

## 2022-06-16 DIAGNOSIS — N7011 Chronic salpingitis: Secondary | ICD-10-CM

## 2022-06-16 DIAGNOSIS — N83202 Unspecified ovarian cyst, left side: Secondary | ICD-10-CM

## 2022-06-16 DIAGNOSIS — A599 Trichomoniasis, unspecified: Secondary | ICD-10-CM

## 2022-06-16 DIAGNOSIS — R7303 Prediabetes: Secondary | ICD-10-CM

## 2022-06-16 DIAGNOSIS — E282 Polycystic ovarian syndrome: Secondary | ICD-10-CM

## 2022-06-16 DIAGNOSIS — R1032 Left lower quadrant pain: Secondary | ICD-10-CM

## 2022-06-16 DIAGNOSIS — N730 Acute parametritis and pelvic cellulitis: Secondary | ICD-10-CM

## 2022-06-16 LAB — BASIC METABOLIC PANEL
BKR ANION GAP: 6 — ABNORMAL LOW (ref 7–17)
BKR BLOOD UREA NITROGEN: 12 mg/dL (ref 6–20)
BKR BUN / CREAT RATIO: 15 (ref 8.0–23.0)
BKR CALCIUM: 8.3 mg/dL — ABNORMAL LOW (ref 8.8–10.2)
BKR CHLORIDE: 105 mmol/L (ref 98–107)
BKR CO2: 25 mmol/L (ref 20–30)
BKR CREATININE: 0.8 mg/dL (ref 0.40–1.30)
BKR EGFR, CREATININE (CKD-EPI 2021): >60 mL/min/{1.73_m2} (ref >=60)
BKR GLUCOSE: 82 mg/dL (ref 70–100)
BKR POTASSIUM: 4.2 mmol/L (ref 3.3–5.3)
BKR SODIUM: 136 mmol/L (ref 136–144)

## 2022-06-16 LAB — CBC WITH AUTO DIFFERENTIAL
BKR WAM ABSOLUTE IMMATURE GRANULOCYTES.: 0.03 x 1000/ÂµL (ref 0.00–0.30)
BKR WAM ABSOLUTE IMMATURE GRANULOCYTES.: 0.07 x 1000/ÂµL (ref 0.00–0.30)
BKR WAM ABSOLUTE LYMPHOCYTE COUNT.: 3.2 x 1000/ÂµL (ref 0.60–3.70)
BKR WAM ABSOLUTE LYMPHOCYTE COUNT.: 2.65 x 1000/ÂµL (ref 0.60–3.70)
BKR WAM ABSOLUTE NRBC (2 DEC): 0 x 1000/ÂµL (ref 0.00–1.00)
BKR WAM ABSOLUTE NRBC (2 DEC): 0 x 1000/ÂµL (ref 0.00–1.00)
BKR WAM ANALYZER ANC: 4.61 x 1000/ÂµL (ref 2.00–7.60)
BKR WAM ANALYZER ANC: 5.67 x 1000/ÂµL (ref 2.00–7.60)
BKR WAM BASOPHIL ABSOLUTE COUNT.: 0.02 x 1000/ÂµL (ref 0.00–1.00)
BKR WAM BASOPHIL ABSOLUTE COUNT.: 0.03 x 1000/ÂµL (ref 0.00–1.00)
BKR WAM BASOPHILS: 0.2 % (ref 0.0–1.4)
BKR WAM BASOPHILS: 0.3 % (ref 0.0–1.4)
BKR WAM EOSINOPHIL ABSOLUTE COUNT.: 0.09 x 1000/ÂµL (ref 0.00–1.00)
BKR WAM EOSINOPHIL ABSOLUTE COUNT.: 0.05 x 1000/ÂµL (ref 0.00–1.00)
BKR WAM EOSINOPHILS: 1 % (ref 0.0–5.0)
BKR WAM EOSINOPHILS: 0.5 % (ref 0.0–5.0)
BKR WAM HEMATOCRIT (2 DEC): 36.8 % (ref 35.00–45.00)
BKR WAM HEMATOCRIT (2 DEC): 39.1 % (ref 35.00–45.00)
BKR WAM HEMOGLOBIN: 12.5 g/dL (ref 11.7–15.5)
BKR WAM HEMOGLOBIN: 13.3 g/dL (ref 11.7–15.5)
BKR WAM IMMATURE GRANULOCYTES: 0.3 % (ref 0.0–1.0)
BKR WAM IMMATURE GRANULOCYTES: 0.8 % (ref 0.0–1.0)
BKR WAM LYMPHOCYTES: 36.2 % (ref 17.0–50.0)
BKR WAM LYMPHOCYTES: 28.4 % (ref 17.0–50.0)
BKR WAM MCH (PG): 32.2 pg (ref 27.0–33.0)
BKR WAM MCH (PG): 32.2 pg (ref 27.0–33.0)
BKR WAM MCHC: 34 g/dL (ref 31.0–36.0)
BKR WAM MCHC: 34 g/dL (ref 31.0–36.0)
BKR WAM MCV: 94.8 fL (ref 80.0–100.0)
BKR WAM MCV: 94.7 fL (ref 80.0–100.0)
BKR WAM MONOCYTE ABSOLUTE COUNT.: 0.88 x 1000/ÂµL (ref 0.00–1.00)
BKR WAM MONOCYTE ABSOLUTE COUNT.: 0.85 x 1000/ÂµL (ref 0.00–1.00)
BKR WAM MONOCYTES: 10 % (ref 4.0–12.0)
BKR WAM MONOCYTES: 9.1 % (ref 4.0–12.0)
BKR WAM MPV: 10.3 fL (ref 8.0–12.0)
BKR WAM MPV: 10.7 fL (ref 8.0–12.0)
BKR WAM NEUTROPHILS: 52.3 % (ref 39.0–72.0)
BKR WAM NEUTROPHILS: 60.9 % (ref 39.0–72.0)
BKR WAM NUCLEATED RED BLOOD CELLS: 0 % (ref 0.0–1.0)
BKR WAM NUCLEATED RED BLOOD CELLS: 0 % (ref 0.0–1.0)
BKR WAM PLATELETS: 174 x1000/ÂµL (ref 150–420)
BKR WAM PLATELETS: 203 x1000/ÂµL (ref 150–420)
BKR WAM RDW-CV: 12.2 % (ref 11.0–15.0)
BKR WAM RDW-CV: 12 % (ref 11.0–15.0)
BKR WAM RED BLOOD CELL COUNT.: 3.88 M/ÂµL — ABNORMAL LOW (ref 4.00–6.00)
BKR WAM RED BLOOD CELL COUNT.: 4.13 M/ÂµL (ref 4.00–6.00)
BKR WAM WHITE BLOOD CELL COUNT: 8.8 x1000/ÂµL (ref 4.0–11.0)
BKR WAM WHITE BLOOD CELL COUNT: 9.3 x1000/ÂµL (ref 4.0–11.0)

## 2022-06-16 LAB — CHLAMYDIA TRACHOMATIS, NAAT (LAB ORDER ONLY) (BH GH L LMW YH): BKR CHLAMYDIA, DNA PROBE: NEGATIVE

## 2022-06-16 LAB — NEISSERIA GONORRHEA, NAAT (LAB ORDER ONLY)   (BH GH L LMW YH): BKR NEISSERIA GONORRHOEAE, DNA PROBE: NEGATIVE

## 2022-06-16 LAB — BACTERIAL VAGINOSIS BY NAAT: BKR BACTERIAL VAGINOSIS NAAT: NEGATIVE

## 2022-06-16 LAB — CANDIDA & TRICHOMONAS VAGINITIS BY NAAT (BH GH LMW YH)
BKR CANDIDA GLABRATA NAAT: NEGATIVE
BKR CANDIDA SPECIES GROUP NAAT: NEGATIVE
BKR TRICHOMONAS VAGINALIS NAAT: NEGATIVE

## 2022-06-16 MED ORDER — SIMETHICONE 125 MG CHEWABLE TABLET
125 mg | Freq: Four times a day (QID) | ORAL | Status: DC | PRN
Start: 2022-06-16 — End: 2022-06-17
  Administered 2022-06-17: 01:00:00 125 mg via ORAL

## 2022-06-16 MED ORDER — MAGNESIUM HYDROXIDE 400 MG/5 ML ORAL SUSPENSION
4005 mg/5 mL | Freq: Two times a day (BID) | ORAL | Status: DC | PRN
Start: 2022-06-16 — End: 2022-06-17
  Administered 2022-06-17: 01:00:00 400 mL via ORAL

## 2022-06-16 MED ORDER — ACETAMINOPHEN 325 MG TABLET
325 mg | Freq: Once | ORAL | Status: DC
Start: 2022-06-16 — End: 2022-06-16

## 2022-06-16 MED ORDER — KETOROLAC 15 MG/ML INJECTION SOLUTION
15 mg/mL | Freq: Four times a day (QID) | INTRAVENOUS | Status: CP
Start: 2022-06-16 — End: ?
  Administered 2022-06-16 (×2): 15 mL via INTRAVENOUS

## 2022-06-16 MED ORDER — LIDOCAINE 4 % TOPICAL PATCH
4 % | TRANSDERMAL | Status: DC
Start: 2022-06-16 — End: 2022-06-17

## 2022-06-16 MED ORDER — OXYCODONE IMMEDIATE RELEASE 5 MG TABLET
5 mg | ORAL | Status: DC | PRN
Start: 2022-06-16 — End: 2022-06-17
  Administered 2022-06-16: 16:00:00 5 mg via ORAL

## 2022-06-16 MED ORDER — OXYCODONE (ROXICODONE) IMMEDIATE RELEASE 2.5 MG HALFTAB
2.5 mg | ORAL | Status: DC | PRN
Start: 2022-06-16 — End: 2022-06-17
  Administered 2022-06-16 – 2022-06-17 (×2): 2.5 mg via ORAL

## 2022-06-16 MED ORDER — ONDANSETRON HCL (PF) 4 MG/2 ML INJECTION SOLUTION
42 mg/2 mL | Freq: Four times a day (QID) | INTRAVENOUS | Status: DC | PRN
Start: 2022-06-16 — End: 2022-06-17
  Administered 2022-06-16 – 2022-06-17 (×2): 4 mL via INTRAVENOUS

## 2022-06-16 MED ORDER — ONDANSETRON 4 MG DISINTEGRATING TABLET
4 mg | Freq: Four times a day (QID) | Status: DC | PRN
Start: 2022-06-16 — End: 2022-06-17
  Administered 2022-06-17: 12:00:00 4 mg

## 2022-06-16 MED ORDER — NORETHINDRONE 1 MG-ETHINYL ESTRADIOL 20 MCG (24)-IRON 75 MG (4) TABLET
1-2024754 mg-20 mcg (24)/75 mg (4) | Freq: Every day | ORAL | Status: SS
Start: 2022-06-16 — End: 2022-06-17

## 2022-06-16 MED ORDER — MORPHINE 4 MG/ML INTRAVENOUS SOLUTION
4 mg/mL | Freq: Once | SUBCUTANEOUS | Status: CP
Start: 2022-06-16 — End: ?
  Administered 2022-06-16: 08:00:00 4 mL via SUBCUTANEOUS

## 2022-06-16 MED ORDER — SODIUM CHLORIDE 0.9 % (FLUSH) INJECTION SYRINGE
0.9 % | Freq: Three times a day (TID) | INTRAVENOUS | Status: DC
Start: 2022-06-16 — End: 2022-06-17
  Administered 2022-06-16: 19:00:00 0.9 mL via INTRAVENOUS

## 2022-06-16 MED ORDER — SODIUM CHLORIDE 0.9 % (FLUSH) INJECTION SYRINGE
0.9 % | INTRAVENOUS | Status: DC | PRN
Start: 2022-06-16 — End: 2022-06-17

## 2022-06-16 MED ORDER — ACETAMINOPHEN 325 MG TABLET
325 mg | Freq: Four times a day (QID) | ORAL | Status: DC
Start: 2022-06-16 — End: 2022-06-17
  Administered 2022-06-16 (×2): 325 mg via ORAL

## 2022-06-16 NOTE — ED Notes
7:20 AM Report received from night shift RN. 7:44 AMReport given to [employer] RN.

## 2022-06-16 NOTE — Care Coordination-Inpatient
[place] report noted with recommendation for f/u and it is noted that Mariah Ferguson has returned and is admitted and has a f/u US scheduled

## 2022-06-16 NOTE — Other
To bedside for abdominal examPt notes improvement in pain, c/w n/v but just received antiemetics. Trying to eat lunchObj: Gen: NAD, Abd: TTP LLQ, improved from priorHct 39A/P:Patient remains stableContinue with pain and nausea control as per prior note

## 2022-06-16 NOTE — Utilization Review (ED)
UM Status: Meets Observation Status , Dr. Derrell Lolling, concern for hemorrhagic ovarian cyst, pain control.

## 2022-06-16 NOTE — Plan of Care
Plan of Care Overview/ Patient Status    Transfer Note Nursing Mariah Ferguson is a 27 y.o. female transferred from ED with a chief complaint of pain.  Assisted in to bed.  Oriented to room. Neuro: A&Ox 4VSS on  RAPain: controlled with atc tylenol, atc toradol and prn oxycodoneGU: Voids spontaneously. GI: LBM 5/22. Tolerating regular diet. C/o nausea, prn IV zofran given with relief. Skin:intactLines/ Drains: left PIV patent. Mobility:independent OOB. Safety maintained. Call bell within reach. See flowsheet for details. Vitals:  06/16/22 0559 06/16/22 0800 06/16/22 1228 06/16/22 1300 BP: (!) 90/58 120/81 105/67  Pulse: 71 (!) 91 67  Resp: 18 20 16   Temp: 97.6 ?F (36.4 ?C) 97.9 ?F (36.6 ?C) 98.2 ?F (36.8 ?C)  TempSrc: Oral Temporal Oral  SpO2: 99% 100% 99%  Weight:    42.9 kg Height:    5' (1.524 m)  I have reviewed the patient's current medication orders.Current Facility-Administered Medications Medication Dose Route Frequency Provider Last Rate Last Admin  acetaminophen (TYLENOL) tablet 975 mg  975 mg Oral Q6H Brioso Penelope Coop, MD   975 mg at 06/16/22 1141  ketorolac (TORadol) injection 15 mg  15 mg IV Push Q6H Brioso Penelope Coop, MD   15 mg at 06/16/22 1141  sodium chloride 0.9 % flush 3 mL  3 mL IV Push Q8H Brioso Penelope Coop, MD   3 mL at 06/16/22 1514  .I have reviewed patient valuables  Problem: Adult Inpatient Plan of CareGoal: Plan of Care ReviewOutcome: Interventions implemented as appropriateGoal: Patient-Specific Goal (Individualized)Outcome: Interventions implemented as appropriateGoal: Absence of Hospital-Acquired Illness or InjuryOutcome: Interventions implemented as appropriateGoal: Optimal Comfort and WellbeingOutcome: Interventions implemented as appropriateGoal: Readiness for Transition of CareOutcome: Interventions implemented as appropriate Problem: Wound Healing ProgressionGoal: Optimal Wound HealingOutcome: Interventions implemented as appropriate Comments:

## 2022-06-16 NOTE — ED Notes
8:05 AM Marita [place], RNPt [facility].10:38 AMFloor Handoff Telemetry: 	[]  Yes		[x]  NoCode Status:   [x]  Full		[]  DNR		[]  DNI		Other (specify):Safety Precautions: []  Fall Risk  []  Sitter   []  Restraints	[]  Suicidal	[x]  None	Other (specify):Mentation/Orientation:	 A&O (Self, person, place, time) x      4    	 Disoriented to:                    	 Special Accommodations: []  Hearing impaired   []  Blind  []  Nonverbal  []  Cognitive impairmentOxygenation Upon Admission: [x]  RA	[]  NC	[]  Venti  []  Simple Mask []  Other	Baseline O2 Status? []  Yes	[]  NoAmbulation: [x]  Independent	[]  Cane   []  Walker	[]  Wheelchair	[]  Bedbound		[]  Hemiplegic	[]  Paraplegic	[]  QuadraplegicEliminiation: [x]  Independent	[]  Commode	[]  Bedpan/Urinal  []  Straight Cath []  Foley cath			[]  Urostomy	[]  Colostomy	Other (specify):Diarrhea/Loose stool : []  1x within 24h  []  2x within 24h  []  3x within 24h  [x]  None 	C.Diff Order: 	[]  Ordered- needs to be collected             []  Collected-sent to lab             []  Resulted - Negative C.Diff             []  Resulted - Positive C.Diff[]  Not Ordered   [x]  N/ASkin Alteration: []  Pressure Injury []  Wound [x]  None []  Skin not assessedDiet: []  Regular/No order placed	[]  NPO		Other (specify):IV Access: [x]  PIV   []  PICC    []  Port    [] Central line    []  A-line    Other (specify)IVF/GTT Running Upon ED Departure? [x]  No	    []  Yes (specify):Outstanding Meds/Treatments/Tests:Patient Belongings:Are the belongings documented?          [x]  No	    []  YesIs someone taking belongings home?   [x]  No     []  Yes  Who? (specify)                                   Marita Kansas, RN

## 2022-06-17 DIAGNOSIS — G8929 Other chronic pain: Secondary | ICD-10-CM

## 2022-06-17 DIAGNOSIS — R531 Weakness: Secondary | ICD-10-CM

## 2022-06-17 DIAGNOSIS — Z602 Problems related to living alone: Secondary | ICD-10-CM

## 2022-06-17 DIAGNOSIS — R112 Nausea with vomiting, unspecified: Secondary | ICD-10-CM

## 2022-06-17 DIAGNOSIS — M25552 Pain in left hip: Secondary | ICD-10-CM

## 2022-06-17 LAB — CBC WITH AUTO DIFFERENTIAL
BKR WAM ABSOLUTE IMMATURE GRANULOCYTES.: 0.04 x 1000/ÂµL (ref 0.00–0.30)
BKR WAM ABSOLUTE LYMPHOCYTE COUNT.: 3.49 x 1000/ÂµL (ref 0.60–3.70)
BKR WAM ABSOLUTE NRBC (2 DEC): 0 x 1000/ÂµL (ref 0.00–1.00)
BKR WAM ANALYZER ANC: 6.78 x 1000/ÂµL (ref 2.00–7.60)
BKR WAM BASOPHIL ABSOLUTE COUNT.: 0.03 x 1000/ÂµL (ref 0.00–1.00)
BKR WAM BASOPHILS: 0.3 % (ref 0.0–1.4)
BKR WAM EOSINOPHIL ABSOLUTE COUNT.: 0.27 x 1000/ÂµL (ref 0.00–1.00)
BKR WAM EOSINOPHILS: 2.3 % (ref 0.0–5.0)
BKR WAM HEMATOCRIT (2 DEC): 39.7 % (ref 35.00–45.00)
BKR WAM HEMOGLOBIN: 13.5 g/dL (ref 11.7–15.5)
BKR WAM IMMATURE GRANULOCYTES: 0.3 % (ref 0.0–1.0)
BKR WAM LYMPHOCYTES: 30.1 % (ref 17.0–50.0)
BKR WAM MCH (PG): 32.1 pg (ref 27.0–33.0)
BKR WAM MCHC: 34 g/dL (ref 31.0–36.0)
BKR WAM MCV: 94.3 fL (ref 80.0–100.0)
BKR WAM MONOCYTE ABSOLUTE COUNT.: 0.99 x 1000/ÂµL (ref 0.00–1.00)
BKR WAM MONOCYTES: 8.5 % (ref 4.0–12.0)
BKR WAM MPV: 10.4 fL (ref 8.0–12.0)
BKR WAM NEUTROPHILS: 58.5 % (ref 39.0–72.0)
BKR WAM NUCLEATED RED BLOOD CELLS: 0 % (ref 0.0–1.0)
BKR WAM PLATELETS: 212 x1000/ÂµL (ref 150–420)
BKR WAM RDW-CV: 11.9 % (ref 11.0–15.0)
BKR WAM RED BLOOD CELL COUNT.: 4.21 M/ÂµL (ref 4.00–6.00)
BKR WAM WHITE BLOOD CELL COUNT: 11.6 x1000/ÂµL — ABNORMAL HIGH (ref 4.0–11.0)

## 2022-06-17 MED ORDER — IBUPROFEN 600 MG TABLET
600 mg | ORAL_TABLET | Freq: Four times a day (QID) | ORAL | 1 refills | Status: AC | PRN
Start: 2022-06-17 — End: ?

## 2022-06-17 MED ORDER — CYCLOBENZAPRINE 10 MG TABLET
10 mg | ORAL_TABLET | Freq: Three times a day (TID) | ORAL | 2 refills | 10.00 days | Status: AC | PRN
Start: 2022-06-17 — End: 2022-06-17

## 2022-06-17 MED ORDER — POLYETHYLENE GLYCOL 3350 17 GRAM ORAL POWDER PACKET
17 gram | Freq: Every day | ORAL | 3 refills | 14.00 days | Status: AC
Start: 2022-06-17 — End: 2022-06-17

## 2022-06-17 MED ORDER — NORETHINDRONE 1 MG-ETHINYL ESTRADIOL 20 MCG (24)-IRON 75 MG (4) TABLET
1-2024754 mg-20 mcg (24)/75 mg (4) | ORAL_TABLET | Freq: Every day | ORAL | 3 refills | Status: AC
Start: 2022-06-17 — End: 2022-06-20

## 2022-06-17 MED ORDER — POLYETHYLENE GLYCOL 3350 17 GRAM ORAL POWDER PACKET
17 gram | Freq: Every day | ORAL | 3 refills | Status: AC
Start: 2022-06-17 — End: ?

## 2022-06-17 MED ORDER — IBUPROFEN 600 MG TABLET
600 mg | Freq: Four times a day (QID) | ORAL | Status: DC | PRN
Start: 2022-06-17 — End: 2022-06-17

## 2022-06-17 MED ORDER — OXYCODONE IMMEDIATE RELEASE 5 MG TABLET
5 mg | ORAL_TABLET | Freq: Four times a day (QID) | ORAL | 1 refills | Status: AC | PRN
Start: 2022-06-17 — End: ?

## 2022-06-17 MED ORDER — POLYETHYLENE GLYCOL 3350 17 GRAM ORAL POWDER PACKET
17 gram | Freq: Every day | ORAL | Status: DC
Start: 2022-06-17 — End: 2022-06-17
  Administered 2022-06-17: 12:00:00 17 gram via ORAL

## 2022-06-17 MED ORDER — CYCLOBENZAPRINE 10 MG TABLET
10 mg | ORAL_TABLET | Freq: Three times a day (TID) | ORAL | 2 refills | Status: AC | PRN
Start: 2022-06-17 — End: ?

## 2022-06-17 MED ORDER — OXYCODONE IMMEDIATE RELEASE 5 MG TABLET
5 mg | ORAL_TABLET | ORAL | 1 refills | 2.00 days | Status: AC | PRN
Start: 2022-06-17 — End: 2022-06-17

## 2022-06-17 MED ORDER — IBUPROFEN 600 MG TABLET
600 mg | ORAL_TABLET | Freq: Four times a day (QID) | ORAL | 2 refills | 8.00 days | Status: AC | PRN
Start: 2022-06-17 — End: ?

## 2022-06-17 MED ORDER — CYCLOBENZAPRINE 10 MG TABLET
10 mg | Freq: Three times a day (TID) | ORAL | Status: DC | PRN
Start: 2022-06-17 — End: 2022-06-17
  Administered 2022-06-17: 12:00:00 10 mg via ORAL

## 2022-06-17 MED ORDER — KETOROLAC 30 MG/ML (1 ML) INJECTION SOLUTION
301 mg/mL (1 mL) | Freq: Four times a day (QID) | INTRAVENOUS | Status: DC | PRN
Start: 2022-06-17 — End: 2022-06-17
  Administered 2022-06-17: 12:00:00 30 mL via INTRAVENOUS

## 2022-06-17 NOTE — H&P
[facility]	 [facility]	GYN Consult Note Consult Information: Reason for consultation: pelvic pain, ovarian cystPresentation History: HPI: Mariah Ferguson is a 27 y.o. G0P0000 w PCOS presenting with pelvic pain that started about 3 days ago. She presented to the ED on 5/23 and eloped, then again on 5/24 and 5/25. She has a hx of ovarian cysts and is s/p cystectomy on 02/2022. She was rx'd Junel and has been taking it since her surgery. Her LMP ended around the 17th. She has not been taking her BC since the 20th or 21st. Regarding her pain, it was severe, sudden onset. She lives alone and had no help, making her present for evaluation. She was not feeling better and pain was preventing her from even walking. The pain is constant. It does not come and go, and it is sharp. She also has had n/v and pain around her shoulders.In ED, she is HDS, with downtrending Hct from first presentation 44.1->42->36.8. otherwise rest of labs are unremarkable. Her TVUS on 5/24 showing 4cm L complex cyst, while 5/25 TVUS notable for 3.9cm complex cystic lesion with thick internal septations, without internal vascularity c/w hemorrhagic cyst w/o evidence of torsion. Brief hx of patient's gynecologic issues:-06/2021: dx PID +trich, had L hematosalpinx on US-12/2021: 5.9cm R sided hemorrhagic cyst, redomenstration of hematosalpinx, was treated for PID although STI testing negative-2/21: s/p L cystectomy: hemorrhagic 4-5 cm L ovarian cyst and 1 cm R paratubal cyst. Path: hemorrhagic corpus luteumReview of Allergies/Medical History/Medications: Allergies:  No Known AllergiesPast Medical History: Past Medical History: Diagnosis Date  Hydrosalpinx 01/17/2022  Left, inpt admission 06/2021  PCOS (polycystic ovarian syndrome) 01/17/2022  Multiple follicles seen on [place]  PID (acute pelvic inflammatory disease)   06/2021  Pre-diabetes 01/24/2022  12/2021 HgbA1C 5.9% Repeat 15m, ordered  Right ovarian cyst 01/17/2022  01/17/22 ED [place]: right adnexa demonstrates 5.9 x 5.2 x 4.8 cm multiloculated cystic structure that may represent a combination of hemorrhagic ovarian cysts versus a hemorrhagic ovarian cyst and hematosalpinx. Rpt [place] in 1 month [ ]  ordered   Trichomonal infection   06/2021 Past Surgical History:No past surgical history on file.Family History: History reviewed. No pertinent family history.Prior to Admission Medications: Medications Prior to Admission Medication Sig Dispense Refill Last Dose  ibuprofen (ADVIL,MOTRIN) 600 mg tablet Take 1 tablet (600 mg total) by mouth every 6 (six) hours as needed for pain. 30 tablet 1   ibuprofen (ADVIL,MOTRIN) 600 mg tablet Take 1 tablet (600 mg total) by mouth every 6 (six) hours as needed for pain. 60 tablet 0   norethindrone-e.estradioL-iron (LOESTRIN 24 FE) 1 mg-20 mcg (24)/75 mg (4) tablet Take 1 tablet by mouth daily. 28 tablet 5   oxyCODONE (ROXICODONE) 5 mg Immediate Release tablet Take 1 tablet (5 mg total) by mouth every 6 (six) hours as needed for pain for up to 5 doses. 5 tablet 0  Physical Exam: Vitals:Temp:  [97.6 ?F (36.4 ?C)-98.2 ?F (36.8 ?C)] 98.2 ?F (36.8 ?C)Pulse:  [67-91] 67Resp:  [16-20] 16BP: (90-120)/(58-81) 105/67SpO2:  [98 %-100 %] 99 %Intake/Output:No intake or output data in the 24 hours ending 06/16/22 1631Physical Exam:Gen- bending in painCV- RRResp- Nl WOBAbd- soft, TTP LLQ, non-distended, voluntary guarding but no rebound,GU- TTP L adnexa though unable to tolerate full examReview of Labs/Diagnostics: Lab Review:Recent Labs Lab 05/24/240923 05/25/240706 05/25/241405 WBC 9.0 8.8 9.3 HGB 14.3 12.5 13.3 HCT 42.70 36.80 39.10 PLT 213 174 203  Recent Labs Lab 05/24/240923 05/25/240706 05/25/241405 NEUTROPHILS 67.3 52.3 60.9  Recent Labs Lab 05/23/241652  05/24/240923 05/25/240706 NA 138 138 136 K 4.1 4.3 4.2 CL 101 102 105 CO2 25 24 25  BUN 12 12 12  CREATININE 0.82 0.90 0.80 GLU 96 95 82  Recent Labs Lab 05/23/241652 05/24/240923 05/25/240706 CALCIUM 9.6 8.8 8.3*  Recent Labs Lab 05/23/241652 ALT 15 AST 28 ALKPHOS 65 BILITOT 0.3  No results for input(s): PTT, LABPROT, INR in the last 168 hours. Recent Labs Lab 05/23/241652 05/24/240923 05/25/240706 GLU 96 95 82   Diagnostic Review:US Non-OB Transvaginal with Limited DopplerResult Date: 5/25/2024Study: [place] NON-OB TRANSVAGINAL WITH LIMITED DOPPLER ([facility]) Indication: LLQ abdominal pain. Pregnancy status: Negative. Comparison: [place] NON-OB TRANSVAGINAL WITH LIMITED DOPPLER ([facility]) 2022-06-15 Technique: Transvaginal ultrasound of the pelvis was performed with gray scale, color, and limited spectral Doppler interrogation. Findings: The uterus measures 5.5 x 2.8 x 3.3 cm. No focal masses are seen. The endometrium measures 5 mm in thickness. The right ovary measures 3.2 x 2.1 x 2.3 cm. There is arterial flow on color and spectral Doppler with a resistive index of 0.53. The left ovary measures 5.5 x 3.9 x 3.9 and contains a 3.4 x 3.9 x 3.9 cm complex cystic lesion with thick internal septations, without internal vascularity. On the most recent study less than 24 hours ago, this lesion measured a similar size but contained thin septations. There is arterial flow on color and spectral Doppler with a resistive index of 0.61. Impression: 1. The complex left ovarian lesion is most consistent with a hemorrhagic cyst given the change in imaging characteristics in a short time frame. Recommend follow-up ultrasound in 6-12 weeks to ensure resolution. No sonographic evidence for left ovarian torsion. 2. Normal appearance of the uterus and right ovary. Report initiated by:  Crissie Figures, MD Reported and signed by: Dorris Singh, MD  [facility] [place] Non-OB Transvaginal with Limited DopplerResult Date: 5/24/2024US NON-OB TRANSVAGINAL WITH LIMITED DOPPLER ([facility]) INDICATION: LLQ pain. ho previous cysts previously. COMPARISON: [place] NON-OB TRANSVAGINAL WITH LIMITED DOPPLER ([facility]) 2022-03-14 TECHNIQUE: Grayscale, color, and pulsed Doppler imaging of the uterus and adnexa was performed via a transvaginal technique. FINDINGS: The uterus is normal in echotexture and measures 6.8 x 3.3 x 3.9 cm. No uterine mass is seen. The endometrial stripe measures 0.5 cm. Images of the cervix appear unremarkable. The right ovary measures 2.9 x 2.7 x 1.9 cm and demonstrates normal arterial flow with a resistive index of 0.6 No abnormal masses are identified in the right ovary or adnexal region. The left ovary measures 5.1 x 3.8 x 5.6 cm and demonstrates normal arterial flow with a resistive index of 0.6. Complex left adnexal cyst with layering fluid measuring up to 4.0 cm noted. There is no pelvic free fluid. Left adnexal 4.0 cm complex cyst with layering fluid. A complex cystic structure also was noted in this same region on the prior, although slightly different ultrasound characteristics. Recommend follow-up in 2-6 weeks with pelvic ultrasound to document complete resolution. No ovarian torsion or abnormality within the visualized uterus. [place] [facility] Notify System Classification: Unexpected findings w trackable imaging recommendation. Report initiated by:  Bonnee Quin, MD Reported and signed by: Glo Herring, MD  [facility]  Impression/Recommendations: Khristy Allain is a 27 y.o. with pelvic pain iso cyst. Exam overall is not c/w torsion and most c/w ruptured hemorrhagic cyst. As pt has had multiple ED presentations and downtrend in H/H, will admit for  obs. We reviewed that if abdominal exam or labs are worsening, could consider dx lsc/cystectomy. We briefly reviewed need for ovarian suppression with likely continuous OCPs. #L hemorrhagic cyst-44.1->42->36.8->[] 1pm labs[] serial exams-discuss suppression once pain is controlled-tylenol/toradol -> motrin, oxy ss prn-antiemeticsDiscussed with Dr. Benjamine Mola.Signed: Annabelle Harman, MDObstetrics & Gynecology5/25/2024

## 2022-06-17 NOTE — Plan of Care
Plan of Care Overview/ Patient Status    Mariah Ferguson was discharged via Private Car accompanied by [employer].  Verbalized understanding of discharge instructionsand recommended follow up care as per the after visit summary.  Written discharge instructions provided. Denies any further questions. Vital signs    Vitals:  06/17/22 0016 06/17/22 0437 06/17/22 0753 06/17/22 1140 BP: 122/83 104/73 99/67 123/81 Pulse: (!) 98 83 72 85 Resp: 18 18 16 16  Temp: 98.6 ?F (37 ?C) 98.1 ?F (36.7 ?C) 98.5 ?F (36.9 ?C) 98.2 ?F (36.8 ?C) TempSrc: Oral Oral Oral Oral SpO2: 96% 100% 99% 100% Weight:     Height:     Patient confirmed all belongings returned. Belongings charted in last 7 days:

## 2022-06-17 NOTE — Discharge Instructions
Ms. Jacqline, [place] you for entrusting [facility] gynecology with your care. We are glad you have begun to feel better.Please continue to take tylenol and motrin as needed, but be sure not to take more than the daily recommended doses - that is 4000mg  of tylenol, and 2400mg  of ibuprofen (motrin).You can take zofran 0.4mg  disintegrating tablets every 8 hours (three times a day) as needed for nausea.Please return to the ED if your pain is poorly controlled or if you have ongoing nausea/vomiting and are unable to keep liquids and small amounts of fluid down.Best,[place] [facility]

## 2022-06-17 NOTE — Plan of Care
Plan of Care Overview/ Patient Status    Pt is a/ox4, pt c/o abdominal/back discomfort, pain meds per mar. Lungs clear on RA, no cough noted, no sob reported. +BS noted, abdomen soft/tender, tolerating diet, voiding w/o difficulty, OOB inde, LBM 5/24, bowel regimen in place. T/Pinde skin c/d/I. Lidocaine patch to back. Call bell within reach, safety maintained, care explained, hourly rounds maintained, will continue to monitor. BP 99/67  - Pulse 72  - Temp 98.5 ?F (36.9 ?C) (Oral)  - Resp 16  - Ht 5' (1.524 m)  - Wt 42.9 kg  - SpO2 99%  - BMI 18.47 kg/m? Whitman Hero, RN

## 2022-06-17 NOTE — Other
To room for acutely worsened pain.Subjective:Mariah Ferguson states pain control this afternoon was much better, but she was experiencing n/v. She noticed pain was creeping up a bit as she was due for medication, but it suddenly worsened while she was OOB and going to the bathroom. She had no issues urinating and did not strain. She did not have a bowel movement. States that this pain is different from prior, as she now feels it moving to the right lower quadrant in addition to LLQ. She feels better when she is standing and moving and worse when she lies flat and stays still. She is passing gas. Since receiving zofran around 1500, she has had no further emesis. She was able to eat a small amount of dinner, but has less appetite than usual. No nausea right now. She does not think that the cyst suddenly ruptured, but she's not sure what's causing her pain.Objective:Vitals:  06/16/22 0559 06/16/22 0800 06/16/22 1228 06/16/22 1300 BP: (!) 90/58 120/81 105/67  Pulse: 71 (!) 91 67  Resp: 18 20 16   Temp: 97.6 ?F (36.4 ?C) 97.9 ?F (36.6 ?C) 98.2 ?F (36.8 ?C)  TempSrc: Oral Temporal Oral  SpO2: 99% 100% 99%  Weight:    42.9 kg Height:    5' (1.524 m) Exam:Uncomfortable appearing but otherwise NADRRRNl WOB on RAAbd - thin, mildly tympanically distended, diffusely with voluntary guarding, no focal tenderness, no rebound tenderness. Well-healed laparoscopy scars.Ext symmetric, WWPBedside handheld US:-Uterus small, partially visualized-Adnexa not visualized-No free fluid seen in pelvis-No FF in morrison's pouch-Pelvic views limited by significant bowel distention, movement, and gasLabs:Recent Labs Lab 05/24/240923 05/25/240706 05/25/241405 WBC 9.0 8.8 9.3 HGB 14.3 12.5 13.3 HCT 42.70 36.80 39.10 PLT 213 174 203  Recent Labs Lab 05/24/240923 05/25/240706 05/25/241405 NEUTROPHILS 67.3 52.3 60.9  Recent Labs Lab 05/23/241652 05/24/240923 05/25/240706 NA 138 138 136 K 4.1 4.3 4.2 CL 101 102 105 CO2 25 24 25  BUN 12 12 12  CREATININE 0.82 0.90 0.80 GLU 96 95 82  Recent Labs Lab 05/23/241652 05/24/240923 05/25/240706 CALCIUM 9.6 8.8 8.3*  Recent Labs Lab 05/23/241652 ALT 15 AST 28 ALKPHOS 65 BILITOT 0.3  No results for input(s): PTT, LABPROT, INR in the last 168 hours. Recent Labs Lab 05/23/241652 05/24/240923 05/25/240706 GLU 96 95 82   Assessment:27 y.o. G0 with history of PCOS and recurrent hemorrhagic ovarian cysts who is admitted for pain control for suspected L sided hemorrhagic cyst without evidence of rupture, now with acutely worsened pain starting while sitting on the toilet, with stable VS and abdominal exam limited by voluntary guarding but without other peritonitic signs and no FF visualized on [place], with stable H&H trend today, overall reassuring for absence of ruptured hemorrhagic cyst with acute intraabdominal bleeding. Leading dx is constipation and gas related pain given abrupt change in bowel habits over the last four days, tympanic distention on exam, and pain improvement with movement (not typical of peritoneal irritation), however differential also includes acute rupture since last CBC with too small blood volume in pelvis to detect with handheld [place] at this time.Recommend conservative treatment with bowel regimen at this time - pt in agreement. Will trial milk of mag, simethicone, oxy 2.5. If not improving, will reexamine, broaden differential to include torsion, rupture, and non-gynecologic problems, and repeat labs. Of note, despite hx trichomonas, normal WBC count, neg PCR testing for STIs on arrival, remaining afebrile, absence of clear CMT on admission, and US findings are all reassuring for absence of PID/TOA.Avelino Leeds, MD5/25/2024

## 2022-06-17 NOTE — ED Provider Notes
Chief Complaint Patient presents with ? Abdominal Pain   Was seen here earlier left ovarian cyst, had imaging done which showed cyst to be 5 cm, was told to it would be drained if it got to 7 cm, however has been having increased pain. HPI/PE:27 y/o F with PMHx of PCOS and a hemorrhagic 4-5 cm L ovarian cyst and 1 cm R paratubal cyst found during diagnostic laparoscopic performed due to concern for ovarian torsion on 03/14/2022 presents with LLQ abdominal pain that has been constant and worsening x 3 days. Patient evaluated in the ED earlier today and dx with 4 cm left ovarian cyst, patient felt improved and was discharged home at that time. Patient states that her pain increased from 4/10 to 9/10. Patient concerned that her cyst has grown. Denies fever, chills, nausea, vomiting, diarrhea, chest pain, SOB, urinary sx, abnormal vaginal discharge, vaginal bleeding. LMP 05/31/22. Patient did not take any at home pain meds.  MDM:27 y/o F with increasing LLQ abdominal pain with known 4 cm ovarian cyst.Ddx: ovarian cyst, cyst rupture, ovarian torsion, consider UTIWill order pregnancy test, TVUS.  9:26 AMPatient [facility] with hemorrhagic cyst on transvaginal ultrasound.  Consulted gyn, spoke to resident who saw patient and will admit patient for pain control.[name], [facility] Emergency MedicineCan be contacted via MHB5/25/2024 9:26 AM Physical ExamED Triage Vitals [06/16/22 0110]BP: 102/63Pulse: 88Pulse from  O2 sat: n/aResp: 20Temp: 98.2 ?F (36.8 ?C)Temp src: OralSpO2: 98 % BP (!) 90/58  - Pulse 71  - Temp 97.6 ?F (36.4 ?C) (Oral)  - Resp 18  - Wt 42.9 kg  - SpO2 99%  - BMI 18.47 kg/m? Physical ExamConstitutional:     General: She is not in acute distress.   Appearance: She is not ill-appearing. Cardiovascular:    Rate and Rhythm: Normal rate and regular rhythm. Pulmonary:    Effort: Pulmonary effort is normal.    Breath sounds: Normal breath sounds. Abdominal:    General: Abdomen is flat. Bowel sounds are normal.    Palpations: Abdomen is soft.    Tenderness: There is abdominal tenderness in the left lower quadrant. There is guarding. Skin:   General: Skin is warm and dry. Neurological:    General: No focal deficit present.    Mental Status: She is alert. Psychiatric:       Mood and Affect: Mood normal.  ProceduresAttestation/Critical CareComments as of 06/16/22 1009 Sat Jun 16, 2022 1008 Attending Supervised: ResidentI saw and examined the patient. I agree with the findings and plan of care as documented in the resident's note. 27 y.o. female presenting with transfer from [facility] for gyn evaluation of hemorrhagic cyst, in persistent pain.  Likely at minimum observation for pain control.  Discussed with gyn for admission.Rush Farmer, MD [CI]  Comments User Index[CI] Rush Farmer, MD   Clinical Impressions as of 06/16/22 1009 Abdominal pain, unspecified abdominal location Hemorrhagic cyst of left ovary  ED Attestation: PA/APRNFace to face evaluation was performed by me in collaboration with the Advanced Practice Provider to assess for significant health threats. I provided a substantive portion of the care of this patient.  I personally performed medically appropriate history and physical exam and the MDM:    On my exam:    My differential includes:   Additional acute and/or chronic problems addressed:Patient presents with with worsening LLQ pain. Was seen earlier today and 2 days ago. Patient reports pain is unbearable and reports this feels similar when they have to do  the diagnostic lap in February.  Patient fevers chills, nausea, vomiting.  On exam, patient has tenderness to palpation of the lower abdomen.  Will obtain ultrasound to rule out any torsion.  Will treat patient's pain.Ultrasound consistent with hemorrhagic cyst, however patient feels uncomfortable going home and has needed multiple doses of pain meds.  Therefore plan to transfer patient to [facility] for gyn assessment.  Gyn notified.  Ambulance called for patient to be transferred over. Will repeat labwork as well. Thomes Cake, MDEmergency MedicineAvailable on MHBED DispositionTransfer To Another Facility Tilford Pillar, MD05/25/24 0701 Angelita Ingles, MDResident05/25/24 0926 Rush Farmer, MD05/25/24 1009 Rush Farmer, MD05/25/24 1623 Inez Rosato, Jacky Kindle, PA-C05/25/24 2006

## 2022-06-17 NOTE — Plan of Care
Plan of Care Overview/ Patient Status    1900-0700Pt is A&Ox4. VSS on room air. C/o Left pelvic/back pain, managed w/ ATC tylenol, PRN oxy, ice packs, and Lidoderm patch. BM this shift after MOM and simethicone given. Voiding spontaneously up to toilet. +emesis, IV zofran x1 w/ + effect. L #20 patent, saline locked. Independent in room.

## 2022-06-17 NOTE — Progress Notes
[facility]	 [facility]	Gynecology Progress NoteAttending Provider: Mearl Latin* Background: 27 y.o. G0 with hx PCOS and prior trich infection as well as with recurrent ovarian cysts now admitted for pain control/serial abdominal examsSubjective: Feeling overall unwell this morning. Describes ongoing abdominal pain but reluctance to take PO medication due to vomiting overnight after taking meds. She states the vomiting is painful and has triggered spastic back pain. Also describes L sided hip pain, which is a chronic issue, and that it is limiting her mobility. Had a bowel movement overnight after MOM. Desires PT consult. Objective: Vitals:Most recent: Patient Vitals for the past 24 hrs: BP Temp Temp src Pulse Resp SpO2 Height Weight 06/17/22 0437 104/73 98.1 ?F (36.7 ?C) Oral 83 18 100 % -- -- 06/17/22 0016 122/83 98.6 ?F (37 ?C) Oral (!) 98 18 96 % -- -- 06/16/22 2118 117/65 98.2 ?F (36.8 ?C) Oral 81 18 94 % -- -- 06/16/22 1300 -- -- -- -- -- -- 5' (1.524 m) 42.9 kg 06/16/22 1228 105/67 98.2 ?F (36.8 ?C) Oral 67 16 99 % -- -- 06/16/22 0800 120/81 97.9 ?F (36.6 ?C) Temporal (!) 91 20 100 % -- -- I/O's:Gross Totals (Last 24 hours) at 06/17/2022 0656Last data filed at 06/16/2022 2200Intake -- Output 150 ml Net -150 ml Physical exam:Gen- Sleeping upon entry to room, upon waking appears mildly comfortable, NADCV- RRRResp- Nl WOBAbd-voluntary guarding throughout, point tenderness in LLQ, otherwise no rebound/guarding Ext- No edema, no calf tendernessBack - paraspinal muscle tendernessNeuro-grossly intact, moving all four extremities, strength and mobility symmetric and intact in lower extremities, +hip pain with left ankle dorsiflexionLabs:Recent Labs Lab 05/25/240706 05/25/241405 05/26/240440 WBC 8.8 9.3 11.6* HGB 12.5 13.3 13.5 HCT 36.80 39.10 39.70 PLT 174 203 212 Recent Labs Lab 05/25/240706 05/25/241405 05/26/240440 NEUTROPHILS 52.3 60.9 58.5  Recent Labs Lab 05/23/241652 05/24/240923 05/25/240706 NA 138 138 136 K 4.1 4.3 4.2 CL 101 102 105 CO2 25 24 25  BUN 12 12 12  CREATININE 0.82 0.90 0.80 GLU 96 95 82  Recent Labs Lab 05/23/241652 05/24/240923 05/25/240706 CALCIUM 9.6 8.8 8.3*  Recent Labs Lab 05/23/241652 ALT 15 AST 28 ALKPHOS 65 BILITOT 0.3  No results for input(s): PTT, LABPROT, INR in the last 168 hours. Recent Labs Lab 05/23/241652 05/24/240923 05/25/240706 GLU 96 95 82   GC/[place]/trich and vaginosis/vaginitis panels neg on admissionUA - noninfectious, trace bloodAssessment/Plan: 27 y.o. with recurrent ovarian cysts now admitted for pain control/serial abdominal exams, with pain now poorly controlled pain on PO regimen and nausea/vomiting. Plan for toradol and flexeril today, pre-treatment before po tylenol/oxy with zofran as needed, bowel reg, encourage PO intake. If feeling better this afternoon may consider substitution of toradol for motrin and discharge home, but anticipate more likely will remain inpatient tonight. D/c dependent PT consult placed.GU: suspected left hemorrhagic ovarian cyst- Stable serial Hct- NOE torsion, intraabdominal bleedingHeme:- No s/s active bleeding, will monitor- AM CBC- VTE ppx: SCDs, ambulationResp: - Stable on RA- IS Cardiac: - No significant risk factors- Monitor VSID:- NAINeuro:- Tylenol, motrin->toradol, oxy ss- Flexeril, heat packs, lido patch for paraspinal muscular back tenderness/painFEN/ GI:- RD- Zofran prn- Bowel regimen: miralax bid, simethicone q6h prnPpx:- SCDs- Encourage ambulationDispo/follow up:- Pending pain control, PT- 5/29 clinic follow up- Repeat TVUS 6-8wSeen with Dr. Benjamine Mola.Signed:Carolin Quang Junious Silk, MD 5/26/202424 h GYN Pager: [phone]

## 2022-06-17 NOTE — Utilization Review (ED)
UM Status: UR Reviewed-Continue Observation Services, pain control for ovarian cyst, Hgb stable, 13.5 this morning.

## 2022-06-18 ENCOUNTER — Encounter: Admit: 2022-06-18 | Payer: PRIVATE HEALTH INSURANCE | Primary: Obstetrics and Gynecology

## 2022-06-19 ENCOUNTER — Encounter: Admit: 2022-06-19 | Payer: PRIVATE HEALTH INSURANCE | Primary: Obstetrics and Gynecology

## 2022-06-20 ENCOUNTER — Encounter: Admit: 2022-06-20 | Payer: MEDICAID | Attending: Obstetrics and Gynecology | Primary: Obstetrics and Gynecology

## 2022-06-20 ENCOUNTER — Encounter: Admit: 2022-06-20 | Payer: PRIVATE HEALTH INSURANCE | Primary: Obstetrics and Gynecology

## 2022-06-20 DIAGNOSIS — E282 Polycystic ovarian syndrome: Secondary | ICD-10-CM

## 2022-06-20 DIAGNOSIS — N83202 Unspecified ovarian cyst, left side: Secondary | ICD-10-CM

## 2022-06-20 DIAGNOSIS — M79605 Pain in left leg: Secondary | ICD-10-CM

## 2022-06-20 MED ORDER — NORETHINDRONE 1 MG-ETHINYL ESTRADIOL 20 MCG (24)-IRON 75 MG (4) TABLET
1-2024754 mg-20 mcg (24)/75 mg (4) | ORAL_TABLET | Freq: Every day | ORAL | 4 refills | Status: AC
Start: 2022-06-20 — End: ?

## 2022-06-20 NOTE — Progress Notes
WHA MidwivesProblem VisitDate: 06/20/2022 				No LMP recorded.:	There were no vitals filed for this visit.Weight   lbs.Chief Concern:Mariah Ferguson is 27 y.o. G0P0000 presenting for follow up after hospital admission for pain control in s/o ruptured hemorrhagic cyst. She presented to the ED with pelvic pain and TVUS showed a 3.9 cm left complex ovarian cystic lesion with thick internal septations c/w hemorrhagic cyst. Her Hct has downtrended from 44.1 to 36.8 in line with the diagnosis. Her pain has subsequently improved with Tylenol/NSAIDs/Flexeril/Oxy. She was also seen by physical therapy during her stay. Her Hct prior to discharge was 39.7. Decision made to discharge her with continuous OCPs for cyst suppression. Plan in place to repeat TVUS in 6-8 weeks. Taking tylenol and ibuprofen for the pain. It comes and goes. Mostly manageable. Feels like dehydration makes her sx worse.Hx emergent ovarian cystectomy 03/14/22. There was concern for torsion, but this was not found. Since surgery, had been feeling much better prior to this most recent episode.She was also Rx'd COCPs earlier this year r/t irregular cycles likely r/t PCOS. She had not had a spontaneous period since end of September. Presumed cause is PCOS (evidence of which was seen on [place]). She did a Provera challenge and did get a withdrawal bleed. Went for blood work I had ordered, which was normal aside from A1C 5.9. Continuous cycling was advised after recent hospital admissionContributing History:Menstrual hx:  see aboveSexual activity / Partners: not SAContraception: Not SA, on COCPGeneral: Appears well, well developed, well nourished and in no acute distressRemainder of exam deferred given improving sxAssessment:Frequent symptomatic ovarian cysts, recently hemorrhagic (3.9cm) during hosp admissionHx ovarian cystectomyRelated left leg painNo period Sept-Dec, got bleed after Provera challenge, presumed cause PCOS Plan:                                                                                             Continuous cycling COCP as advisedPain medication as rx'd at [facility] [place] in 6 weeks, ordered to [facility] Order placed for PT at [facility] guidelines reviewedRTO annual and PRNKristin Benjaman Kindler, CNM5/29/2024Patient Active Problem List  Diagnosis   Abdominal pain, unspecified abdominal location   Left ovarian cyst    02/2022 emergent ovarian cystectomy r/t concern for torsion  Pre-diabetes    12/2021 HgbA1C 5.9%Reviewed lifestyle changesRepeat 74m, ordered  Hydrosalpinx    Left, inpt admission 06/2021  Right ovarian cyst    01/17/22 ED [place]: right adnexa demonstrates 5.9 x 5.2 x 4.8 cm multiloculated cystic structure that may represent a combination of hemorrhagic ovarian cysts versus a hemorrhagic ovarian cyst and hematosalpinx.Rpt [place] in 1 month ordered: done in ED 2/10 when presented with pain: Previously seen likely right hemorrhagic cyst is now significantly decreased in size, measuring 1.4 x 1.4 x 1.4 cm  PCOS (polycystic ovarian syndrome)    Multiple follicles seen on USMinastrin Rx'd 01/2022 ALLERGIES: She has No Known Allergies. PAST MEDICAL HISTORY: She  has a past medical history of Hydrosalpinx (01/17/2022), PCOS (polycystic ovarian syndrome) (01/17/2022), PID (acute pelvic inflammatory disease), Pre-diabetes (01/24/2022), Right ovarian cyst (01/17/2022), and Trichomonal infection. PAST SURGICAL HISTORY: She  has no past surgical history on file. PAST OBSTETRICAL  HISTORY: OB History   Gravida 0  Para 0  Term 0  Preterm 0  AB 0  Living 0   SAB 0  IAB 0  Ectopic 0  Molar 0  Multiple 0  Live Births 0    FAMILY HISTORY: Her family history is not on file. SOCIAL HISTORY: She   MEDICATIONS: She has a current medication list which includes the following prescription(s): cyclobenzaprine (FLEXERIL) 10 mg tablet - Take 1 tablet (10 mg total) by mouth 3 (three) times daily as needed for muscle spasms (back pain), ibuprofen (ADVIL,MOTRIN) 600 mg tablet - Take 1 tablet (600 mg total) by mouth every 6 (six) hours as needed for pain, ibuprofen (ADVIL,MOTRIN) 600 mg tablet - Take 1 tablet (600 mg total) by mouth every 6 (six) hours as needed for pain, norethindrone-e.estradioL-iron (LOESTRIN 24 FE) 1 mg-20 mcg (24)/75 mg (4) tablet - Take 1 tablet by mouth daily, oxyCODONE (ROXICODONE) 5 mg Immediate Release tablet - Take 1 tablet (5 mg total) by mouth every 6 (six) hours as needed for pain for up to 5 doses, and polyethylene glycol (MIRALAX) 17 gram packet - Take 1 packet (17 g total) by mouth daily. Mix in 8 ounces of water, juice, soda, coffee or tea prior to taking.

## 2022-06-21 NOTE — Discharge Summary
Gynecology - Discharge SummaryPatient Data:  Patient Name: Mariah Ferguson Age: 27 y.o. DOB: 1995-02-24	 MRN: [id number]	 Admit Date: 5/25/2024Discharge Date: 5/26/2024Discharge Attending Physician: Mearl Latin*  PCP: Freddy Jaksch, CNMPrincipal Diagnosis: Abdominal pain, unspecified abdominal locationOther Diagnosis: Ruptured hemorrhagic cystDischarged Condition: GoodDisposition: Home Allergies: No Known Allergies Hospital Course: 27 y.o. woman G0 who was admitted for pain control in s/o ruptured hemorrhagic cyst. She presented to the ED with pelvic pain and TVUS showed a 3.9 cm left complex ovarian cystic lesion with thick internal septations c/w hemorrhagic cyst. Her Hct has downtrended from 44.1 to 36.8 in line with the diagnosis. Her pain has subsequently improved with Tylenol/NSAIDs/Flexeril/Oxy. She was also seen by physical therapy during her stay. Her Hct prior to discharge was 39.7. Decision made to discharge her with continuous OCPs (Sprintec) for cyst suppression. She has clinic follow up on 5/29 and will undergo repeat TVUS in 6-8 weeks.Pertinent Lab Findings and Test Results:Recent Labs Lab 05/25/240706 05/25/241405 05/26/240440 WBC 8.8 9.3 11.6* HGB 12.5 13.3 13.5 HCT 36.80 39.10 39.70 MCV 94.8 94.7 94.3 PLT 174 203 212 Recent Labs Lab 05/23/241652 05/24/240923 05/25/240706 NA 138 138 136 K 4.1 4.3 4.2 CL 101 102 105 CO2 25 24 25  BUN 12 12 12  CREATININE 0.82 0.90 0.80 GLU 96 95 82 ANIONGAP 12 12 6* CALCIUM 9.6 8.8 8.3* AST 28  --   --  ALT 15  --   --  ALKPHOS 65  --   --  No results for input(s): INR, PTT in the last 168 hours.Invalid input(s): PTDischarge Vitals: Blood pressure 123/81, pulse 85, temperature 98.2 ?F (36.8 ?C), temperature source Oral, resp. rate 16, height 5' (1.524 m), weight 42.9 kg, SpO2 100 %, not currently breastfeeding.Discharge Physical Exam:Physical exam was performed on day of discharge, please see daily progress note for details. Cognitive Status at Discharge: At baseline.Pending Labs and Tests: ISSUES TO BE ADDRESSED POST DISCHARGE: Follow up in clinic on 5/29Repeat TVUS in 6-8 weeksDischarge Medications: Current Discharge Medication List  START taking these medications  Details cyclobenzaprine (FLEXERIL) 10 mg tablet Take 1 tablet (10 mg total) by mouth 3 (three) times daily as needed for muscle spasms (back pain).Qty: 30 tablet, Refills: 1Start date: 06/17/2022, End date: 07/07/2022  polyethylene glycol (MIRALAX) 17 gram packet Take 1 packet (17 g total) by mouth daily. Mix in 8 ounces of water, juice, soda, coffee or tea prior to taking.Qty: 14 each, Refills: 2Start date: 06/18/2022   CONTINUE these medications which have CHANGED  Details !! ibuprofen (ADVIL,MOTRIN) 600 mg tablet Take 1 tablet (600 mg total) by mouth every 6 (six) hours as needed for pain.Qty: 30 tablet, Refills: 1Start date: 06/17/2022  !! ibuprofen (ADVIL,MOTRIN) 600 mg tablet Take 1 tablet (600 mg total) by mouth every 6 (six) hours as needed for pain.Qty: 60 tablet, Refills: 0Start date: 06/17/2022  norethindrone-e.estradioL-iron (LOESTRIN 24 FE) 1 mg-20 mcg (24)/75 mg (4) tablet Take 1 tablet by mouth daily.Qty: 28 tablet, Refills: 2Start date: 06/17/2022  oxyCODONE (ROXICODONE) 5 mg Immediate Release tablet Take 1 tablet (5 mg total) by mouth every 6 (six) hours as needed for pain for up to 5 doses.Qty: 5 tablet, Refills: 0Start date: 06/17/2022   !! - Potential duplicate medications found. Please discuss with provider.  Follow-up Information:[place], Eugene [facility] [place] [phone] up on 5/29/2024For follow up visitCOR[facility]LL [facility] [address] [place] [phone] an appointment as soon as possible for a visitAs needed PM[facility] Past Medical History: Diagnosis Date  Hydrosalpinx 01/17/2022  Left, inpt admission 06/2021  PCOS (polycystic ovarian syndrome) 01/17/2022  Multiple follicles seen on [place]  PID (acute pelvic inflammatory disease)   06/2021  Pre-diabetes 01/24/2022  12/2021 HgbA1C 5.9% Repeat 11m, ordered  Right ovarian cyst 01/17/2022  01/17/22 ED [place]: right adnexa demonstrates 5.9 x 5.2 x 4.8 cm multiloculated cystic structure that may represent a combination of hemorrhagic ovarian cysts versus a hemorrhagic ovarian cyst and hematosalpinx. Rpt [place] in 1 month [ ]  ordered   Trichomonal infection   06/2021  No past surgical history on file. Social History Family History Social History Tobacco Use  Smoking status: Not on file  Smokeless tobacco: Not on file Substance Use Topics  Alcohol use: Not on file  History reviewed. No pertinent family history. Electronically Signed:Abdelrahman AlAshqar, MDResident Physician, PGY-2[facility] and Reproductive SciencesPersonal MHB: [phone]) [phone]/26/2024  12:45 PM

## 2022-06-28 ENCOUNTER — Telehealth
Admit: 2022-06-28 | Payer: PRIVATE HEALTH INSURANCE | Attending: Obstetrics and Gynecology | Primary: Obstetrics and Gynecology

## 2022-06-28 ENCOUNTER — Inpatient Hospital Stay
Admit: 2022-06-28 | Discharge: 2022-06-29 | Payer: MEDICAID | Attending: Internal Medicine | Primary: Obstetrics and Gynecology

## 2022-06-28 ENCOUNTER — Emergency Department: Admit: 2022-06-28 | Payer: MEDICAID | Primary: Obstetrics and Gynecology

## 2022-06-28 DIAGNOSIS — R112 Nausea with vomiting, unspecified: Secondary | ICD-10-CM

## 2022-06-28 DIAGNOSIS — R109 Unspecified abdominal pain: Secondary | ICD-10-CM

## 2022-06-28 DIAGNOSIS — R531 Weakness: Secondary | ICD-10-CM

## 2022-06-28 MED ORDER — LIDOCAINE 4 % TOPICAL PATCH
4 % | Freq: Once | TRANSDERMAL | Status: CP
Start: 2022-06-28 — End: ?

## 2022-06-28 MED ORDER — ONDANSETRON HCL (PF) 4 MG/2 ML INJECTION SOLUTION
42 mg/2 mL | Freq: Once | INTRAVENOUS | Status: CP
Start: 2022-06-28 — End: ?
  Administered 2022-06-29: 01:00:00 4 mL via INTRAVENOUS

## 2022-06-28 MED ORDER — ONDANSETRON 4 MG DISINTEGRATING TABLET
4 mg | ORAL_TABLET | Freq: Three times a day (TID) | ORAL | 3 refills | Status: AC | PRN
Start: 2022-06-28 — End: ?

## 2022-06-28 MED ORDER — MORPHINE 4 MG/ML INTRAVENOUS SOLUTION
4 mg/mL | Freq: Once | INTRAVENOUS | Status: CP
Start: 2022-06-28 — End: ?
  Administered 2022-06-29: 04:00:00 4 mL via INTRAVENOUS

## 2022-06-28 MED ORDER — MORPHINE 4 MG/ML INTRAVENOUS SOLUTION
4 mg/mL | Freq: Once | INTRAVENOUS | Status: CP
Start: 2022-06-28 — End: ?
  Administered 2022-06-29: 01:00:00 4 mL via INTRAVENOUS

## 2022-06-28 NOTE — Telephone Encounter
Mariah Ferguson is a 27 year old G0 calling to report continued vomiting and inability to hold anything down. Pt with emergent ovarian cystectomy on 03/14/22 and ruptured hemorrhagic cyst. Pt taking COCP's daily for cyst suppression. Reports vomiting when taking Oxycodone. Pt reports her severe pain is ok at the moment but her lower back pain is bothering her and is exacerbated by her vomiting. Pt woke up randomly at 5am this morning with vomiting. Has vomited ~ 5 times. Reports nausea as well. Pt states she can't keep anything down. The last time she vomited was 2 days ago when she attempted to take the Oxycodone. She started her menses on 6/4 as well. She feels dehydrated and tired. She also has a headache which feels like a migraine to her. Advised BRAT diet, Gatorade, ginger ale etc. Pt does not think this is related to any foods that she eats and does not think this is a stomach bug as she has had these vomiting episodes in the past randomly and sometimes related to the pain. NKDAPharmacy confirmed. Zofran 4mg  ODT rx'd. Instructions given to take ODT until vomiting is under control and aware can take orally if desires. Pt appreciative of my call and advice. All of her questions were answered at this time. Mariah Ferguson,RN6/6/20244:34 PM

## 2022-06-28 NOTE — Telephone Encounter
Pt hasn't been able to keep any of prescribed medications down due to vomiting. Pt requested to speak with you. I let pt know you were seeing pt's and may not get back to her today. Pt ok'd.

## 2022-06-29 ENCOUNTER — Emergency Department: Admit: 2022-06-29 | Payer: MEDICAID | Primary: Obstetrics and Gynecology

## 2022-06-29 ENCOUNTER — Telehealth: Admit: 2022-06-29 | Payer: PRIVATE HEALTH INSURANCE | Primary: Obstetrics and Gynecology

## 2022-06-29 DIAGNOSIS — R1032 Left lower quadrant pain: Secondary | ICD-10-CM

## 2022-06-29 DIAGNOSIS — R112 Nausea with vomiting, unspecified: Secondary | ICD-10-CM

## 2022-06-29 DIAGNOSIS — E282 Polycystic ovarian syndrome: Secondary | ICD-10-CM

## 2022-06-29 DIAGNOSIS — R531 Weakness: Secondary | ICD-10-CM

## 2022-06-29 DIAGNOSIS — R109 Unspecified abdominal pain: Secondary | ICD-10-CM

## 2022-06-29 LAB — URINALYSIS WITH CULTURE REFLEX      (BH LMW YH)
BKR BILIRUBIN, UA: NEGATIVE U/L (ref 10–35)
BKR GLUCOSE, UA: NEGATIVE
BKR LEUKOCYTE ESTERASE, UA: NEGATIVE
BKR NITRITE, UA: NEGATIVE
BKR PH, UA: 6 (ref 5.5–7.5)
BKR SPECIFIC GRAVITY, UA: 1.036 g/dL — ABNORMAL HIGH (ref 1.005–1.030)
BKR UROBILINOGEN, UA (MG/DL): <2 mg/dL (ref <=2.0)

## 2022-06-29 LAB — COMPREHENSIVE METABOLIC PANEL
BKR A/G RATIO: 1.2 % (ref 1.0–2.2)
BKR ALANINE AMINOTRANSFERASE (ALT): 21 U/L (ref 10–35)
BKR ALBUMIN: 4.9 g/dL — AB (ref 3.6–5.1)
BKR ALKALINE PHOSPHATASE: 74 U/L (ref 9–122)
BKR ASPARTATE AMINOTRANSFERASE (AST): 32 U/L (ref 10–35)
BKR AST/ALT RATIO: 1.5
BKR BILIRUBIN TOTAL: 0.2 mg/dL (ref <=1.2)
BKR BLOOD UREA NITROGEN: 14 mg/dL (ref 6–20)
BKR BUN / CREAT RATIO: 17.5 (ref 8.0–23.0)
BKR CALCIUM: 9.9 mg/dL (ref 8.8–10.2)
BKR CHLORIDE: 102 mmol/L (ref 98–107)
BKR CO2: 22 mmol/L (ref 20–30)
BKR CREATININE: 0.8 mg/dL (ref 0.40–1.30)
BKR EGFR, CREATININE (CKD-EPI 2021): >60 mL/min/{1.73_m2} (ref >=60–1.0)
BKR GLOBULIN: 4.2 g/dL — ABNORMAL HIGH (ref 2.0–3.9)
BKR GLUCOSE: 87 mg/dL — ABNORMAL HIGH (ref 70–100)
BKR POTASSIUM: 4.2 mmol/L (ref 3.3–5.3)
BKR PROTEIN TOTAL: 9.1 g/dL — ABNORMAL HIGH (ref 5.9–8.3)

## 2022-06-29 LAB — CBC WITH AUTO DIFFERENTIAL
BKR WAM ABSOLUTE IMMATURE GRANULOCYTES.: 0.02 x 1000/ÂµL (ref 0.00–0.30)
BKR WAM ABSOLUTE LYMPHOCYTE COUNT.: 1.2 x 1000/ÂµL (ref 0.60–3.70)
BKR WAM ABSOLUTE NRBC (2 DEC): 0 x 1000/ÂµL (ref 0.00–1.00)
BKR WAM ANALYZER ANC: 6.43 x 1000/ÂµL (ref 2.00–7.60)
BKR WAM BASOPHIL ABSOLUTE COUNT.: 0.01 x 1000/ÂµL (ref 0.00–1.00)
BKR WAM BASOPHILS: 0.1 % (ref 0.0–1.4)
BKR WAM EOSINOPHIL ABSOLUTE COUNT.: 0.01 x 1000/ÂµL (ref 0.00–1.00)
BKR WAM EOSINOPHILS: 0.1 % — ABNORMAL HIGH (ref 0.0–5.0)
BKR WAM HEMATOCRIT (2 DEC): 46.9 % — ABNORMAL HIGH (ref 35.00–45.00)
BKR WAM HEMOGLOBIN: 15.9 g/dL — ABNORMAL HIGH (ref 11.7–15.5)
BKR WAM IMMATURE GRANULOCYTES: 0.2 % (ref 0.0–1.0)
BKR WAM LYMPHOCYTES: 14.5 % — ABNORMAL LOW (ref 17.0–50.0)
BKR WAM MCH (PG): 32.1 pg (ref 27.0–33.0)
BKR WAM MCHC: 33.9 g/dL (ref 31.0–36.0)
BKR WAM MCV: 94.7 fL (ref 80.0–100.0)
BKR WAM MONOCYTE ABSOLUTE COUNT.: 0.58 x 1000/ÂµL (ref 0.00–1.00)
BKR WAM MONOCYTES: 7 % (ref 4.0–12.0)
BKR WAM MPV: 9.9 fL (ref 8.0–12.0)
BKR WAM NEUTROPHILS: 78.1 % — ABNORMAL HIGH (ref 39.0–72.0)
BKR WAM NUCLEATED RED BLOOD CELLS: 0 % (ref 0.0–1.0)
BKR WAM PLATELETS: 224 x1000/ÂµL (ref 150–420)
BKR WAM RDW-CV: 12.3 % — ABNORMAL HIGH (ref 11.0–15.0)
BKR WAM RED BLOOD CELL COUNT.: 4.95 M/ÂµL (ref 4.00–6.00)
BKR WAM WHITE BLOOD CELL COUNT: 8.3 x1000/ÂµL (ref 4.0–11.0)

## 2022-06-29 LAB — DIRECT SODIUM (LAB ORDERABLE ONLY) (BH YH): BKR DIRECT SODIUM: 141 mmol/L (ref 136–144)

## 2022-06-29 LAB — URINE MICROSCOPIC     (BH GH LMW YH)
BKR RBC/HPF INSTRUMENT: 4 /HPF — ABNORMAL HIGH (ref 0–2)
BKR URINE SQUAMOUS EPITHELIAL CELLS, UA (NUMERIC): 7 /HPF — ABNORMAL HIGH (ref 0–5)
BKR WBC/HPF INSTRUMENT: 2 /HPF (ref 0–5)

## 2022-06-29 LAB — UA REFLEX CULTURE

## 2022-06-29 LAB — LIPASE: BKR LIPASE: 18 U/L (ref 11–55)

## 2022-06-29 MED ORDER — ONDANSETRON HCL (PF) 4 MG/2 ML INJECTION SOLUTION
42 mg/2 mL | Freq: Once | INTRAVENOUS | Status: CP
Start: 2022-06-29 — End: ?
  Administered 2022-06-29: 05:00:00 4 mL via INTRAVENOUS

## 2022-06-29 MED ORDER — KETOROLAC 15 MG/ML INJECTION SOLUTION
15 mg/mL | Freq: Once | INTRAVENOUS | Status: CP
Start: 2022-06-29 — End: ?
  Administered 2022-06-29: 14:00:00 15 mL via INTRAVENOUS

## 2022-06-29 MED ORDER — LIDOCAINE 4 % TOPICAL PATCH
4 % | Freq: Once | TRANSDERMAL | Status: DC
Start: 2022-06-29 — End: 2022-06-29

## 2022-06-29 MED ORDER — ONDANSETRON HCL (PF) 4 MG/2 ML INJECTION SOLUTION
42 mg/2 mL | Freq: Once | INTRAVENOUS | Status: CP
Start: 2022-06-29 — End: ?
  Administered 2022-06-29: 07:00:00 4 mL via INTRAVENOUS

## 2022-06-29 MED ORDER — IOHEXOL (OMNIPAQUE) 300 MG/ML ORAL SOLUTION 30 ML IN WATER 900 ML
Freq: Once | ORAL | Status: CP | PRN
Start: 2022-06-29 — End: ?
  Administered 2022-06-29: 08:00:00 900.000 mL via ORAL

## 2022-06-29 MED ORDER — SODIUM CHLORIDE 0.9 % LARGE VOLUME SYRINGE FOR AUTOINJECTOR
Freq: Once | INTRAVENOUS | Status: CP | PRN
Start: 2022-06-29 — End: ?
  Administered 2022-06-29: 08:00:00 via INTRAVENOUS

## 2022-06-29 MED ORDER — IOHEXOL 350 MG IODINE/ML INTRAVENOUS SOLUTION
350 mg iodine/mL | Freq: Once | INTRAVENOUS | Status: CP | PRN
Start: 2022-06-29 — End: ?
  Administered 2022-06-29: 08:00:00 350 mL via INTRAVENOUS

## 2022-06-29 MED ORDER — MORPHINE 4 MG/ML INTRAVENOUS SOLUTION
4 mg/mL | Freq: Once | INTRAVENOUS | Status: CP
Start: 2022-06-29 — End: ?
  Administered 2022-06-29: 10:00:00 4 mL via INTRAVENOUS

## 2022-06-29 MED ORDER — ONDANSETRON HCL (PF) 4 MG/2 ML INJECTION SOLUTION
42 mg/2 mL | Freq: Once | INTRAVENOUS | Status: CP
Start: 2022-06-29 — End: ?
  Administered 2022-06-29: 10:00:00 4 mL via INTRAVENOUS

## 2022-06-29 NOTE — ED Notes
7:45 PM Pt to ED for eval of abdominal pain, nausea, and vomiting x3 days. PMH PCOS with surgical cystectomy 2/21. Endorses this is the same pain she has previously experienced during a flare up. Pt presents tearful and in visible discomfort, A+Ox4, airway intact and speaking in full and complete sentences, denies chest pain. 10:22 PM Urine collected, pt to bathroom in wheelchair unable to ambulate due to pain reporting her pain is currently managed when lying still in the stretcher but movement severely exacerbates it.1:01 AM Pt with instance of emesis. Provider notified and Zofran administered. Pt to receive [place] scan.4:02 AM [place] scan complete.6:32 AM Pt to be admitted. Updated on plan of care and verbalized understanding.7:13 AM Handoff report given to oncoming nurse and care deferred.

## 2022-06-29 NOTE — ED Notes
10:54 AM Report received from off-going RN. Pt resting in bed, no acute distress.11:10 AM Report given to oncoming RN.

## 2022-06-29 NOTE — ED Notes
7:19 AM Received report from oncoming RN, care assumed. Plan is for patient to be admitted, pending admission bed at this time. She is currently resting comfortably on the stretcher. 10:17 AMPatient endorsing back pain and asking for pain medication. 10:38 AMReport given to [employer], care deferred.

## 2022-06-29 NOTE — Significant Event
Per RN. Patient eloped. I did not see the patient prior to her leaving. Humza Tallerico M'Sadoques PA-C Hospitalist/SDU6/07/2022 4:05 PM

## 2022-06-29 NOTE — ED Provider Notes
Chief Complaint Patient presents with  Abdominal Pain   27 year old female with vomiting x 3 days, denies fevers, chills. Feeling weakness, + vomiting, denies diarrhea. Hx of PCOS HPI:  Patient is a 27 year old female with a past medical history of PCOS, pelvic inflammatory disease, hydrosalpinx, presenting to the ED with a chief complaint of abdominal pain, nausea and vomiting.  Patient endorses she has a long history of PCOS as well as ovarian cysts, endorses that she was recently discharged from the hospital with a left-sided ovarian cyst.  Patient reports that she was having very similar pain to the pain she gets from hemorrhagic cysts, and states that she has been nauseated and vomiting for the last 3 days.  Patient reports that she started getting significant low back pressure 10/10, nonradiating while waiting here in the ED.Patient endorses her menstruation began 2 days ago, has been passing nickel sized clots intermittently.  States that she has not been bleeding is frequently as normal in his only been noticing blood while sitting down onto the toilet.Denies fevers, chills, bloody vomit, bloody or dark and tarry stools, dysuria, hematuria, frequency. Of note on chart review patient has a known 3.9 cm left complex ovarian cyst consistent with hemorrhagic cyst.  She has been taking Sprintec (OCPs), but states that they have been making her feel sick, and using oral NSAIDs, Flexeril and Oxy for pain.MDM: Patient is a 27 year old female with a past medical history of PCOS, pelvic inflammatory disease, hydrosalpinx, presenting to the ED with a chief complaint of abdominal pain, nausea and vomiting.  Three days of nausea, vomiting, 1 day of bilateral low back pressure.  ROS negative as noted in HPI.  Left lower quadrant abdominal tenderness to palpation without rebound or guarding.  Similar to patient's prior cysts and known 3.9 cm left-sided hemorrhagic cyst. DDX: Pregnancy, menstruation, hemorrhagic cyst, ovarian torsion, ectopicPlan: CBC, CMP, UA, Lipase, POC urine pregnancy.---------------[place] Abdomen Pelvis w IV Contrast with oral contrast Final Result Exam limited due to paucity of intraabdominal fat. 1. Redemonstration of 4.1 cm left ovarian hemorrhagic cyst. Small free fluid in the pelvis. 2. Thick-walled appearance of the urinary bladder is nonspecific, clinical correlation to exclude cystitis recommended.   [facility] Notify System Classification: Routine.  Report initiated by:  Shon Millet, MD  Reported and signed by: Dorris Singh, MD    [facility]ical [facility]   [place] Non-OB Transvaginal with Limited Doppler Final Result   Redemonstration of 4.1 cm left ovarian hemorrhagic cyst. Normal flow is visualized within the bilateral ovaries. However, given the enlarged size of the left ovary, intermittent torsion is not entirely excluded. Clinical correlation is advised.  Report initiated by:  Shon Millet, MD  Reported and signed by: Dorris Singh, MD    [facility] and Biomedical Imaging   Meds:Medications lidocaine 4 % topical patch 1 patch (1 patch Transdermal Patch Applied 06/29/22 1021) morphine injection 4 mg (4 mg IV Push Given 06/28/22 2052) ondansetron (PF) (ZOFRAN) injection 4 mg (4 mg IV Push Given 06/28/22 2052) lidocaine 4 % topical patch 1 patch (1 patch Transdermal Patch Removed 06/29/22 1153) morphine injection 4 mg (4 mg IV Push Given 06/28/22 2353) ondansetron (PF) (ZOFRAN) injection 4 mg (4 mg IV Push Given 06/29/22 0056) iohexol (OMNIPAQUE) 300 mg/mL oral solution 30 mL in water 900 mL (900 mLs Oral Given 06/29/22 0356) ondansetron (PF) (ZOFRAN) injection 4 mg (4 mg IV Push Given 06/29/22 0259) iohexoL (OMNIPAQUE) 350 mg iodine/mL injection 80 mL (80 mLs  Intravenous Given 06/29/22 0356) sodium chloride 0.9% large volume syringe for autoinjector 50 mL (50 mLs Intravenous Given 06/29/22 0356) morphine injection 4 mg (4 mg IV Push Given 06/29/22 0610) ondansetron (PF) (ZOFRAN) injection 4 mg (4 mg IV Push Given 06/29/22 0610) ketorolac (TORadol) injection 15 mg (15 mg IV Push Given 06/29/22 1022)  Medication List  You have not been prescribed any medications. Dr.Sun was available for consult.Discussed with Dr. Joaquin Bend, PA-CPast Medical History: Diagnosis Date  Hydrosalpinx 01/17/2022  Left, inpt admission 06/2021  PCOS (polycystic ovarian syndrome) 01/17/2022  Multiple follicles seen on [place]  PID (acute pelvic inflammatory disease)   06/2021  Pre-diabetes 01/24/2022  12/2021 HgbA1C 5.9% Repeat 37m, ordered  Right ovarian cyst 01/17/2022  01/17/22 ED [place]: right adnexa demonstrates 5.9 x 5.2 x 4.8 cm multiloculated cystic structure that may represent a combination of hemorrhagic ovarian cysts versus a hemorrhagic ovarian cyst and hematosalpinx. Rpt [place] in 1 month [ ]  ordered   Trichomonal infection   06/2021 No past surgical history on file. Physical ExamED Triage Vitals [06/28/22 1839]BP: 107/75Pulse: (!) 111Pulse from  O2 sat: n/aResp: 16Temp: 98.1 ?F (36.7 ?C)Temp src: n/aSpO2: 99 % BP 97/63  - Pulse 73  - Temp 97.9 ?F (36.6 ?C)  - Resp 16  - Wt 43 kg  - SpO2 97%  - BMI 18.51 kg/m? Physical ExamVitals and nursing note reviewed. Constitutional:     General: She is not in acute distress.   Appearance: She is well-developed and normal weight. She is not ill-appearing or toxic-appearing. HENT:    Head: Normocephalic and atraumatic.    Mouth/Throat:    Mouth: Mucous membranes are moist.    Pharynx: Oropharynx is clear. No pharyngeal swelling or oropharyngeal exudate. Cardiovascular:    Rate and Rhythm: Normal rate and regular rhythm.    Heart sounds: Normal heart sounds. No murmur heard.   No friction rub. No gallop. Pulmonary:    Effort: Pulmonary effort is normal. Breath sounds: Normal breath sounds. No wheezing, rhonchi or rales. Abdominal:    General: Abdomen is flat. Bowel sounds are normal.    Palpations: Abdomen is soft.    Tenderness: There is abdominal tenderness in the left lower quadrant. There is no right CVA tenderness, left CVA tenderness, guarding or rebound. Negative signs include Murphy's sign, Rovsing's sign, McBurney's sign, psoas sign and obturator sign. Skin:   General: Skin is warm and dry.    Coloration: Skin is not jaundiced or pale.    Findings: No rash. Neurological:    General: No focal deficit present.    Mental Status: She is alert and oriented to person, place, and time.  ProceduresAttestation/Critical CareComments as of 06/29/22 1349 Thu Jun 28, 2022 2143 Signed out to Dr. Wynelle Link [KS] [id number] ===================================Patient was signed out to me pending TVUS. Rush Farmer, MD [CI] Caleen Essex Jun 29, 2022 0203 Patient was having persistent nausea, found to have large hemorrhagic cyst however is still tender to palpation left lower quadrant, given her persistent symptoms we will plan for [place] abdomen pelvis for further evaluation, will use p.o. contrast given tiny stature. [CI] [phone] Discussed at length with the pt about concerns of pain and on going nausea, initially ok with going home but seems she has been through similar things many times and has been unable to tolerate home pain meds. She would benefit from a pain management consult for possible patches or other multimodal pain control and further work up of her nausea and vomiting as no true etiology  was discovered.  [CI]  Comments User Index[CI] Rush Farmer, MD[KS] Donetta Potts, [place]   Clinical Impressions as of 06/29/22 1349 Abdominal pain  ED Attestation: PA/APRNFace to face evaluation was performed by me in collaboration with the Advanced Practice Provider to assess for significant health threats. I provided a substantive portion of the care of this patient.  I personally performed medically appropriate history and physical exam and the MDM:    On my exam:    My differential includes:   Additional acute and/or chronic problems addressed:Patient presents with lower abdominal pain that started for the past 3 days. Currently on her period. Has a hx of left complex ovarian cystic lesion with thick internal septations.  Patient denies any nausea, vomiting, chest pain, shortness of breath, fevers, chills.  On exam, patient has diffuse tenderness to palpation of the lower abdomen.  Patient also has mild tenderness to palpation of the bilateral flanks.  With no midline spine tenderness.  Concern for torsion, will obtain ultrasound.  Additionally, most likely patient has muscle spasms in the back.  Will treat with pain.  We will also obtain UA to evaluate for UTI versus possible pyelonephritis.Pending ovarian ultrasound, patient signed out to incoming team.Wendy Wynelle Link, MDEmergency MedicineAvailable on MHBED DispositionAdmit Tilford Pillar, MD06/06/24 2317 Rush Farmer, MD06/06/24 2343 Rush Farmer, MD06/07/24 0535 Rush Farmer, MD06/07/24 0657 Donetta Potts, PA06/07/24 1349

## 2022-06-29 NOTE — ED Notes
12:14 PM Pt requesting to leave. MD M'Sadoques contacted and made aware. Pt does not wish to wait to be seen by MD to be formally discharged. Pts pain controlled and is in no acute distress at this time. Ambulating with steady gait independently out of ED.

## 2022-06-29 NOTE — Utilization Review (ED)
Abdominal pain, PMHx of PCOS and pelvic inflammatory disease with a known left complex ovarian cyst. Drain abdomen/pelvis reviewed, Zofran IV 4 mg x 5, Morphine IV 4 mg x 3. UM Status: Meets Observation Status Hilbert Odor, BSN-RN, MSRNUtilization Review Specialist

## 2022-06-29 NOTE — Telephone Encounter
Outreach to [employer] to speak about recent ED Visit for abdominal pain. Patient consents to call: Accepts. Patient states they left the emergency room prior to being seen because wait time . Patient states that they are currently Still experiencing symptoms   Mariah Ferguson  was advised to follow up with PCP. Patient verbalizes understanding and will require follow up.Tolerating PO at this time, last time she vomited was 20 minutes ago, and is still urinating.  She states she is not concerned about dehydration at this time.  Advised the patient that the plan was for her to be admitted, she stated that she did not wan to wait for a room to become available.  Advised pt that if her symptoms do not improve that she should return to the ED.  Encounter routed to PCP as an FYI. After reviewing the chart and speaking with the patient, Patient states they will follow up with PCP or specialistProtocol launched:No

## 2022-07-02 ENCOUNTER — Encounter: Admit: 2022-07-02 | Payer: PRIVATE HEALTH INSURANCE | Primary: Obstetrics and Gynecology

## 2022-07-02 ENCOUNTER — Telehealth
Admit: 2022-07-02 | Payer: PRIVATE HEALTH INSURANCE | Attending: Obstetrics and Gynecology | Primary: Obstetrics and Gynecology

## 2022-07-02 NOTE — Telephone Encounter
TC placed to [name] to f/u after ED visit on 6/6.  LMOVM. Ileene Musa, RN6/10/20241:03 PM

## 2022-09-12 ENCOUNTER — Inpatient Hospital Stay: Admit: 2022-09-12 | Discharge: 2022-09-12 | Payer: MEDICAID

## 2022-09-12 ENCOUNTER — Telehealth: Admit: 2022-09-12 | Payer: PRIVATE HEALTH INSURANCE | Primary: Obstetrics and Gynecology

## 2022-09-12 ENCOUNTER — Telehealth
Admit: 2022-09-12 | Payer: PRIVATE HEALTH INSURANCE | Attending: Obstetrics and Gynecology | Primary: Obstetrics and Gynecology

## 2022-09-12 MED ORDER — ERYTHROMYCIN 5 MG/GRAM (0.5 %) EYE OINTMENT
5 | Freq: Four times a day (QID) | OPHTHALMIC | 1 refills | Status: AC
Start: 2022-09-12 — End: ?

## 2022-09-12 NOTE — Discharge Instructions
Return if worseFollow up with the PCP 2-3 days.

## 2022-09-12 NOTE — ED Notes
11:22 AM Pt OK to D/C per PA Balga. Pt verbalized understanding of D/C instructions and medications. No PIV in place.

## 2022-09-12 NOTE — Telephone Encounter
Copied from CRM #[id number]. Topic: General Message - [facility]>> Sep 12, 2022  8:58 AM Rondel Baton wrote:[facility] MESSAGETime of call:   8:58 AMCaller:   Mcquaid, Jermiya RoyeCaller's relationship to patient:  selfCalling from (pharmacy, hospital, agency, etc.):  homeReason for call:  pt calling states she has a stye on left eye and now right eye is painful and stye is starting.  needs referral and exam notes [facility] [address] [email or web address] [place], [place] [address] ? 4.9 [phone] in 30 minsBest telephone number for callback:   [phone] [facility] Referral Specialist

## 2022-09-16 ENCOUNTER — Emergency Department: Admit: 2022-09-16 | Payer: MEDICAID | Primary: Obstetrics and Gynecology

## 2022-09-16 ENCOUNTER — Encounter: Admit: 2022-09-16 | Payer: PRIVATE HEALTH INSURANCE | Primary: Obstetrics and Gynecology

## 2022-09-16 ENCOUNTER — Inpatient Hospital Stay: Admit: 2022-09-16 | Discharge: 2022-09-16 | Payer: MEDICAID | Attending: Emergency Medicine

## 2022-09-16 DIAGNOSIS — A599 Trichomoniasis, unspecified: Secondary | ICD-10-CM

## 2022-09-16 DIAGNOSIS — M25511 Pain in right shoulder: Secondary | ICD-10-CM

## 2022-09-16 DIAGNOSIS — E282 Polycystic ovarian syndrome: Secondary | ICD-10-CM

## 2022-09-16 DIAGNOSIS — N83201 Unspecified ovarian cyst, right side: Secondary | ICD-10-CM

## 2022-09-16 DIAGNOSIS — R7303 Prediabetes: Secondary | ICD-10-CM

## 2022-09-16 DIAGNOSIS — N730 Acute parametritis and pelvic cellulitis: Secondary | ICD-10-CM

## 2022-09-16 DIAGNOSIS — N7011 Chronic salpingitis: Secondary | ICD-10-CM

## 2022-09-16 MED ORDER — KETOROLAC 15 MG/ML INJECTION SOLUTION
15 | Freq: Once | INTRAMUSCULAR | Status: CP
Start: 2022-09-16 — End: ?
  Administered 2022-09-16: 12:00:00 15 mL via INTRAMUSCULAR

## 2022-09-16 MED ORDER — ACETAMINOPHEN 325 MG TABLET
325 | ORAL_TABLET | Freq: Four times a day (QID) | ORAL | 1 refills | Status: AC | PRN
Start: 2022-09-16 — End: ?

## 2022-09-16 MED ORDER — LIDOCAINE 4 % TOPICAL PATCH
4 | MEDICATED_PATCH | TRANSDERMAL | 1 refills | Status: AC
Start: 2022-09-16 — End: ?

## 2022-09-16 MED ORDER — ACETAMINOPHEN 500 MG TABLET
500 | Freq: Once | ORAL | Status: CP
Start: 2022-09-16 — End: ?
  Administered 2022-09-16: 12:00:00 500 mg via ORAL

## 2022-09-16 MED ORDER — IBUPROFEN 600 MG TABLET
600 | ORAL_TABLET | Freq: Four times a day (QID) | ORAL | 1 refills | Status: AC | PRN
Start: 2022-09-16 — End: ?

## 2022-09-16 MED ORDER — OXYCODONE IMMEDIATE RELEASE 5 MG TABLET
5 | Freq: Once | ORAL | Status: CP
Start: 2022-09-16 — End: ?
  Administered 2022-09-16: 12:00:00 5 mg via ORAL

## 2022-09-16 NOTE — Discharge Instructions
Please return to the ED if you develop new or worsening or progressive symptoms including fever higher than 100.4, severe nausea/vomiting, if you are unable to keep yourself hydrated with fluids, have severe abdominal pain, lightheadedness or feel like you are going to pass out, chest pain, difficulty breathing, confusion, visual changes or weakness or numbness in your extremities or any other symptoms that concern you. In addition you should return to the ED if you are unable to adequate outpatient follow-up as directed.  If you have any narcotics in your system do not operate heavy machinery, drive or care for children or elderly while these medicines are in your system.If you were told to take tylenol for pain you can take 1 tab (325 mg) every 6 hours as needed for pain. Do not take more than 3000 mg in one day. Taking too much tylenol (also known as acetaminophen) can be toxic to your liver and lead to death. Please remember that some medicines contained tylenol in them like percoset or vicodin and should not be combined with tylenol alone.

## 2022-09-16 NOTE — ED Provider Notes
Chief Complaint Patient presents with  Shoulder Pain   Patent here with right shoulder pain, reports waking up at 1 AM with pain, reports pain is steadily getting worse. Denied falls/trauma. Arrives with makeshift sling in place. Reports heavy backpack. Took tylenol this AM. Appears under the influence however denies. +numbness/tingling to right arm.  MDM  Physical ExamED Triage Vitals [09/16/22 0744]BP: 106/72Pulse: (!) 93Pulse from  O2 sat: n/aResp: 18Temp: 98 ?F (36.7 ?C)Temp src: OralSpO2: 100 % BP 106/72  - Pulse (!) 93  - Temp 98 ?F (36.7 ?C) (Oral)  - Resp 18  - Wt 40 kg  - LMP 08/21/2022 (Approximate)  - SpO2 100%  - BMI 17.22 kg/m? Physical Exam ProceduresAttestation/Critical CarePatient Reevaluation: 27 year old female history of PCOS presenting with right shoulder pain.  Patient denies chest pain shortness breath headache nausea vomiting diarrhea bladder bowel incontinence saddle anesthesia.  Denies prior surgeries.  States a few days ago she lifted her book bag which was heavy with that arm and felt uncomfortable afterwards.  Had been hoping the discomfort would go away but today felt it worse and came into the ED. exam overall well-appearing, no overt distress speaking full sentences no respiratory distress abdomen soft nontender nondistended no rebound or guarding no midline neck thoracic or lumbar tenderness shoulder without overt deformity no erythema. FROM of neck.  Strength and sensation intact.  Normal gait.  Consider fracture or dislocation plan for x-ray doubt ACS PE dissectionOn my review of x-ray no overt fracture.  Discussed with patient and plan for supportive pain control and sling.Results discussed with patient feels comfortable plan for discharge home outpatient follow-up understands strict return precautions come back to ED.Clinical Impressions as of 09/16/22 0932 Acute pain of right shoulder ED DispositionDischarge Cruz Condon, MD08/25/24 0932

## 2022-09-16 NOTE — ED Notes
9:48 AM discharged by another staff member.  Pt did state that she was feeling much better after medicated

## 2022-09-16 NOTE — ED Notes
7:48 AM +cms in right arm, pt continues with sling she put on in place, appears to be some type of blanket made into a sling.  Started with a stiff right side of neck yesterday and progressed to extreme pain in shoulder muscles

## 2022-09-19 ENCOUNTER — Inpatient Hospital Stay: Admit: 2022-09-19 | Discharge: 2022-09-19 | Payer: MEDICAID

## 2022-09-19 ENCOUNTER — Encounter: Admit: 2022-09-19 | Payer: MEDICAID | Primary: Obstetrics and Gynecology

## 2022-09-19 MED ORDER — ONDANSETRON HCL (PF) 4 MG/2 ML INJECTION SOLUTION
42 mg/2 mL | Freq: Once | INTRAVENOUS | Status: DC
Start: 2022-09-19 — End: 2022-09-20

## 2022-09-19 MED ORDER — OXYCODONE IMMEDIATE RELEASE 5 MG TABLET
5 mg | Freq: Once | ORAL | Status: CP
Start: 2022-09-19 — End: ?
  Administered 2022-09-20: 04:00:00 5 mg via ORAL

## 2022-09-19 MED ORDER — ONDANSETRON 4 MG DISINTEGRATING TABLET
4 mg | Freq: Once | ORAL | Status: CP
Start: 2022-09-19 — End: ?
  Administered 2022-09-20: 03:00:00 4 mg via ORAL

## 2022-09-19 MED ORDER — OXYCODONE IMMEDIATE RELEASE 5 MG TABLET
5 mg | ORAL_TABLET | ORAL | 1 refills | Status: AC | PRN
Start: 2022-09-19 — End: ?

## 2022-09-19 MED ORDER — ONDANSETRON HCL 4 MG TABLET
4 mg | ORAL_TABLET | Freq: Four times a day (QID) | ORAL | 1 refills | Status: AC
Start: 2022-09-19 — End: ?

## 2022-09-19 NOTE — ED Provider Notes
Chief Complaint Patient presents with  Eye Pain   Patient presents with styes to both eyes x several weeks. States painful. Reports using eye drops and warm compresses. Denies fever.  HPI/PE:Mariah Ferguson, a 27 year old with a history of prediabetes, presents with bilateral eye irritation and possible stye.. The left eye symptoms began last Wednesday and the right eye symptoms started today. The patient describes the symptoms as a 'sigh' in the left eye and increasing pain and irritation in the right eye. They deny any associated fever or chills. The patient has a history of a similar condition at the age of 34. 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 Over-the-counter eye drops and hot compresses from [facility] have not provided relief. The patient denies taking any daily medications, blood thinners, or having any known allergies to medications.No discharge.WJX:BJYNW last Wednesday in the left eye and today in the right eye. No visual disturbances, fever, or chills. Over-the-counter eye drops and hot compresses have not been effective.Medical decision making:- Differential diagnosis includes but not limited to stye, doubt cellulitis, doubt orbital cellulitis, doubt preseptal cellulitis.Concern for stye will treat with antibiotics- During ED course,Gross vision intact, pupils equal round and reactive, no foreign body noted, eyes are not injected.- Determinants of care:  PCP none availableEmergency Medicine Dr. Duffy Rhody  available for consultationFollow up as noted in discharge instructionsStrict return precautions given.    Physical ExamED Triage Vitals [09/12/22 1027]BP: 90/63Pulse: 90Pulse from  O2 sat: n/aResp: 16Temp: 98.3 ?F (36.8 ?C)Temp src: OralSpO2: 98 % BP 90/63 Comment: Simultaneous filing. User may not have seen previous data. - Pulse 90 Comment: Simultaneous filing. User may not have seen previous data. - Temp 98.3 ?F (36.8 ?C) (Oral) Comment: Simultaneous filing. User may not have seen previous data. Comment (Src): Simultaneous filing. User may not have seen previous data. - Resp 16 Comment: Simultaneous filing. User may not have seen previous data. - Wt 39.7 kg Comment: Simultaneous filing. User may not have seen previous data. - SpO2 98% Comment: Simultaneous filing. User may not have seen previous data. - BMI 17.09 kg/m? Physical ExamVitals and nursing note reviewed. Constitutional:     General: She is not in acute distress.   Appearance: She is not ill-appearing, toxic-appearing or diaphoretic. HENT:    Mouth/Throat:    Mouth: Mucous membranes are moist. Eyes:    General:       Right eye: Hordeolum present. No foreign body or discharge.       Left eye: Hordeolum present.No foreign body or discharge.    Extraocular Movements: Extraocular movements intact.    Conjunctiva/sclera:    Right eye: Right conjunctiva is not injected. No chemosis or exudate.   Left eye: Left conjunctiva is not injected. No chemosis or exudate.Pulmonary:    Effort: Pulmonary effort is normal. Musculoskeletal:    Cervical back: Neck supple. Skin:   General: Skin is warm and dry.    Coloration: Skin is not jaundiced or pale. Neurological:    General: No focal deficit present.    Mental Status: She is alert. Psychiatric:       Mood and Affect: Mood normal.       Behavior: Behavior normal.  ProceduresAttestation/Critical CareClinical Impressions as of 09/12/22 1119 Hordeolum externum, unspecified laterality  ED DispositionDischarge Elwanda [place], PA08/21/24 1120 Elwanda [place], PA08/28/24 1352

## 2022-09-20 NOTE — ED Notes
Provider at bedside

## 2022-09-20 NOTE — ED Notes
10:37 PM Chief Complaint Patient presents with  Shoulder Pain   Presents to ED for eval of atraumatic right shoulder pain for a week. Seen here 3 days ago for same. Reports meds not working, remains in pain Agree w/triage notes; pt states when she performs activity on RUE she feels nerve pain and the pain makes her nauseated and is unable to eat; pt states she is unsure is she was provided d/c prescriptions; CMS intact

## 2022-09-20 NOTE — Discharge Instructions
Return if worse

## 2022-10-12 NOTE — ED Provider Notes
 Chief Complaint Patient presents with  Shoulder Pain   Presents to ED for eval of atraumatic right shoulder pain for a week. Seen here 3 days ago for same. Reports meds not working, remains in pain HPI/PE:History of Present Illness:The patient presents with new onset right shoulder and neck pain that started on Friday. They describe the pain as a discomfort in the nerve, radiating from the neck to the wrist. They deny any history of trauma or falls and report that they woke up with the pain. They sought medical attention three days ago and were prescribed pain medications and advised to stretch. Despite using lidocaine patches.  Patient denies any chest pain.Tylenol, codeine, and hot compresses, the pain persists. They had an x-ray of the shoulder which showed no dislocation, fracture, or broken bones.Physical ExamCHEST: Lungs clear to auscultation.CARDIOVASCULAR: Heart sounds normal.ABDOMEN: Abdomen soft, non-tender.EXTREMITIES: Well perfused, no edema. Range of motion intact.MUSCULOSKELETAL: Right shoulder exhibits good range of motion.NEUROLOGICAL: Sensation intact to touch. Median and radial nerves intact.ResultsRADIOLOGYRight shoulder X-ray: No dislocation, no fracture (09/17/2022)per patientAssessment & PlanMDM:Patient with Right Shoulder PainNew onset, radiating from neck to wrist. No history of trauma. X-ray three days ago showed no dislocation or fracture. Pain not relieved by lidocaine patches, Tylenol, codeine, or hot compresses. Physical exam showed good range of motion and intact medial radial and ulnar nerves.No evidence of cardiac etiology.  No chest pain or shortness breath.  No evidence of acute abdomen.  Abdomen soft nontender.Positive Hawkins sign.  Positive apprehension sign.No evidence of septic joint.  Range of motion intact.  Joint is not hot.Findings consistent with impingement syndrome.-Review previous x-ray from 3 days ago reviewed directly.-Consider further imaging or referral to orthopedics if pain persists.-Continue current pain management regimen.Tobacco UseCurrent smoker.-Advise to quit smoking.Patient is scheduled for MRI of right shoulder.  later this week Patient gave verbal consent to use [employer] including audio recording for documentation.Emergency Medicine Dr.  Michael Litter available for consultationFollow up as noted in discharge instructionsIndependent interpretation of: XRay  Physical ExamED Triage Vitals [09/19/22 2150]BP: 116/80Pulse: 77Pulse from  O2 sat: n/aResp: 16Temp: 98.3 ?F (36.8 ?C)Temp src: OralSpO2: 100 % BP 116/80  - Pulse 77  - Temp 98.3 ?F (36.8 ?C) (Oral)  - Resp 16  - Wt 41 kg  - LMP 08/21/2022 (Approximate)  - SpO2 100%  - BMI 17.65 kg/m? Physical ExamVitals and nursing note reviewed. Constitutional:     General: She is not in acute distress.   Appearance: She is not ill-appearing, toxic-appearing or diaphoretic. Pulmonary:    Effort: Pulmonary effort is normal. Musculoskeletal:       General: Tenderness and signs of injury present. No swelling or deformity.    Right shoulder: Tenderness and bony tenderness present. No swelling, deformity, effusion, laceration or crepitus. Normal range of motion. Normal strength. Normal pulse.    Cervical back: Neck supple. Skin:   General: Skin is warm and dry.    Coloration: Skin is not jaundiced or pale. Neurological:    General: No focal deficit present.    Mental Status: She is alert. Psychiatric:       Mood and Affect: Mood normal.       Behavior: Behavior normal.  ProceduresAttestation/Critical CareClinical Impressions as of 09/24/22 1021 Acute pain of right shoulder  ED DispositionDischarge Elwanda [place], PA08/28/24 2350 Elwanda [place], PA09/02/24 1022 Elwanda [place], PA09/02/24 1022 Elwanda [place], PA09/20/24 [id number]

## 2022-11-02 ENCOUNTER — Encounter
Admit: 2022-11-02 | Payer: PRIVATE HEALTH INSURANCE | Attending: Obstetrics and Gynecology | Primary: Obstetrics and Gynecology

## 2022-11-05 ENCOUNTER — Encounter
Admit: 2022-11-05 | Payer: PRIVATE HEALTH INSURANCE | Attending: Obstetrics and Gynecology | Primary: Obstetrics and Gynecology

## 2022-11-05 DIAGNOSIS — N83201 Unspecified ovarian cyst, right side: Secondary | ICD-10-CM

## 2022-11-08 ENCOUNTER — Encounter: Admit: 2022-11-08 | Payer: PRIVATE HEALTH INSURANCE | Primary: Obstetrics and Gynecology

## 2022-12-27 ENCOUNTER — Inpatient Hospital Stay: Admit: 2022-12-27 | Discharge: 2022-12-31 | Payer: MEDICAID | Attending: Gynecology | Admitting: Gynecology

## 2022-12-27 ENCOUNTER — Emergency Department: Admit: 2022-12-27 | Payer: MEDICAID | Primary: Obstetrics and Gynecology

## 2022-12-27 DIAGNOSIS — N7011 Chronic salpingitis: Secondary | ICD-10-CM

## 2022-12-27 LAB — URINALYSIS WITH CULTURE REFLEX      (BH LMW YH)
BKR BILIRUBIN, UA: NEGATIVE
BKR BLOOD, UA: NEGATIVE
BKR GLUCOSE, UA: NEGATIVE
BKR KETONES, UA: NEGATIVE
BKR LEUKOCYTE ESTERASE, UA: NEGATIVE
BKR NITRITE, UA: NEGATIVE
BKR PH, UA: 6.5 (ref 5.5–7.5)
BKR PROTEIN, UA: NEGATIVE
BKR SPECIFIC GRAVITY, UA: 1.024 (ref 1.005–1.030)
BKR UROBILINOGEN, UA (MG/DL): <2 mg/dL (ref <=2.0)

## 2022-12-27 LAB — CBC WITH AUTO DIFFERENTIAL
BKR WAM ABSOLUTE IMMATURE GRANULOCYTES.: 0.02 x 1000/ÂµL (ref 0.00–0.30)
BKR WAM ABSOLUTE LYMPHOCYTE COUNT.: 2.92 x 1000/ÂµL (ref 0.60–3.70)
BKR WAM ABSOLUTE NRBC (2 DEC): 0 x 1000/ÂµL (ref 0.00–1.00)
BKR WAM ANC (ABSOLUTE NEUTROPHIL COUNT): 3.57 x 1000/ÂµL (ref 2.00–7.60)
BKR WAM BASOPHIL ABSOLUTE COUNT.: 0.03 x 1000/ÂµL (ref 0.00–1.00)
BKR WAM BASOPHILS: 0.4 % (ref 0.0–1.4)
BKR WAM EOSINOPHIL ABSOLUTE COUNT.: 0.1 x 1000/ÂµL (ref 0.00–1.00)
BKR WAM EOSINOPHILS: 1.4 % (ref 0.0–5.0)
BKR WAM HEMATOCRIT (2 DEC): 40.6 % (ref 35.00–45.00)
BKR WAM HEMOGLOBIN: 13.4 g/dL (ref 11.7–15.5)
BKR WAM IMMATURE GRANULOCYTES: 0.3 % (ref 0.0–1.0)
BKR WAM LYMPHOCYTES: 40.2 % (ref 17.0–50.0)
BKR WAM MCH (PG): 31.5 pg (ref 27.0–33.0)
BKR WAM MCHC: 33 g/dL (ref 31.0–36.0)
BKR WAM MCV: 95.5 fL (ref 80.0–100.0)
BKR WAM MONOCYTE ABSOLUTE COUNT.: 0.63 x 1000/ÂµL (ref 0.00–1.00)
BKR WAM MONOCYTES: 8.7 % (ref 4.0–12.0)
BKR WAM MPV: 10.3 fL (ref 8.0–12.0)
BKR WAM NEUTROPHILS: 49 % (ref 39.0–72.0)
BKR WAM NUCLEATED RED BLOOD CELLS: 0 % (ref 0.0–1.0)
BKR WAM PLATELETS: 175 x1000/ÂµL (ref 150–420)
BKR WAM RDW-CV: 12.5 % (ref 11.0–15.0)
BKR WAM RED BLOOD CELL COUNT.: 4.25 M/ÂµL (ref 4.00–6.00)
BKR WAM WHITE BLOOD CELL COUNT: 7.3 x1000/ÂµL (ref 4.0–11.0)

## 2022-12-27 LAB — URINE MICROSCOPIC     (BH GH LMW YH)
BKR RBC/HPF INSTRUMENT: 1 /HPF (ref 0–2)
BKR URINE SQUAMOUS EPITHELIAL CELLS, UA (NUMERIC): 9 /HPF — ABNORMAL HIGH (ref 0–5)
BKR WBC/HPF INSTRUMENT: 2 /HPF (ref 0–5)

## 2022-12-27 LAB — HEPATIC FUNCTION PANEL
BKR A/G RATIO: 1.4 (ref 1.0–2.2)
BKR ALANINE AMINOTRANSFERASE (ALT): 9 U/L — ABNORMAL LOW (ref 10–35)
BKR ALBUMIN: 4.3 g/dL (ref 3.6–5.1)
BKR ALKALINE PHOSPHATASE: 67 U/L (ref 9–122)
BKR ASPARTATE AMINOTRANSFERASE (AST): 22 U/L (ref 10–35)
BKR AST/ALT RATIO: 2.4
BKR BILIRUBIN DIRECT: <0.1 mg/dL (ref <=0.2)
BKR BILIRUBIN TOTAL: 0.2 mg/dL (ref <=1.2)
BKR GLOBULIN: 3.1 g/dL (ref 2.0–3.9)
BKR PROTEIN TOTAL: 7.4 g/dL (ref 5.9–8.3)

## 2022-12-27 LAB — BASIC METABOLIC PANEL
BKR ANION GAP: 9 (ref 7–17)
BKR BLOOD UREA NITROGEN: 9 mg/dL (ref 6–20)
BKR BUN / CREAT RATIO: 15 (ref 8.0–23.0)
BKR CALCIUM: 8.8 mg/dL (ref 8.8–10.2)
BKR CHLORIDE: 107 mmol/L (ref 98–107)
BKR CO2: 23 mmol/L (ref 20–30)
BKR CREATININE DELTA: -0.1
BKR CREATININE: 0.6 mg/dL (ref 0.40–1.30)
BKR EGFR, CREATININE (CKD-EPI 2021): >60 mL/min/{1.73_m2} (ref >=60)
BKR GLUCOSE: 88 mg/dL (ref 70–100)
BKR POTASSIUM: 4.2 mmol/L (ref 3.3–5.3)
BKR SODIUM: 139 mmol/L (ref 136–144)

## 2022-12-27 LAB — HCG, QUANTITATIVE     (BH GH LMW YH): BKR HCG, QUANTITATIVE: <1 m[IU]/mL

## 2022-12-27 LAB — MAGNESIUM: BKR MAGNESIUM: 2 mg/dL (ref 1.7–2.4)

## 2022-12-27 LAB — LIPASE: BKR LIPASE: 25 U/L (ref 11–55)

## 2022-12-27 MED ORDER — ACETAMINOPHEN 325 MG TABLET
325 mg | Freq: Four times a day (QID) | ORAL | Status: CP
Start: 2022-12-27 — End: 2022-12-31
  Administered 2022-12-28 (×3): 325 mg via ORAL

## 2022-12-27 MED ORDER — ONDANSETRON HCL (PF) 4 MG/2 ML INJECTION SOLUTION
4 | Freq: Once | INTRAVENOUS | Status: CP
Start: 2022-12-27 — End: ?
  Administered 2022-12-27: 22:00:00 4 mL via INTRAVENOUS

## 2022-12-27 MED ORDER — SODIUM CHLORIDE 0.9 % (FLUSH) INJECTION SYRINGE
0.9 % | INTRAVENOUS | Status: AC | PRN
Start: 2022-12-27 — End: 2022-12-31

## 2022-12-27 MED ORDER — CEFTRIAXONE 1 GRAM SOLUTION FOR INJECTION
1 gram | INTRAMUSCULAR | Status: DC
Start: 2022-12-27 — End: 2022-12-28

## 2022-12-27 MED ORDER — OXYCODONE IMMEDIATE RELEASE 5 MG TABLET
5 mg | ORAL | Status: DC | PRN
Start: 2022-12-27 — End: 2022-12-28
  Administered 2022-12-28: 10:00:00 5 mg via ORAL

## 2022-12-27 MED ORDER — MORPHINE 4 MG/ML INTRAVENOUS SOLUTION
4 | Freq: Once | SUBCUTANEOUS | Status: CP
Start: 2022-12-27 — End: ?
  Administered 2022-12-27: 22:00:00 4 mL via SUBCUTANEOUS

## 2022-12-27 MED ORDER — IBUPROFEN 600 MG TABLET
600 mg | Freq: Four times a day (QID) | ORAL | Status: CP
Start: 2022-12-27 — End: 2023-01-01
  Administered 2022-12-28 (×3): 600 mg via ORAL

## 2022-12-27 MED ORDER — METRONIDAZOLE 500 MG/100 ML IN SODIUM CHLOR(ISO) INTRAVENOUS PIGGYBACK
500100 mg/100 mL | Freq: Two times a day (BID) | INTRAVENOUS | Status: DC
Start: 2022-12-27 — End: 2022-12-28
  Administered 2022-12-28: 05:00:00 500 mL/h via INTRAVENOUS

## 2022-12-27 MED ORDER — DOXYCYCLINE 100MG MBP
Freq: Two times a day (BID) | INTRAVENOUS | Status: DC
Start: 2022-12-27 — End: 2022-12-28
  Administered 2022-12-28: 04:00:00 100.000 mL/h via INTRAVENOUS

## 2022-12-27 MED ORDER — CEFTRIAXONE IV PUSH 1000 MG VIAL & NS (ADULTS)
Freq: Once | INTRAVENOUS | Status: CP
Start: 2022-12-27 — End: ?
  Administered 2022-12-28: 04:00:00 10.000 mL via INTRAVENOUS

## 2022-12-27 MED ORDER — SODIUM CHLORIDE 0.9 % (FLUSH) INJECTION SYRINGE
0.9 % | Freq: Three times a day (TID) | INTRAVENOUS | Status: AC
Start: 2022-12-27 — End: 2022-12-31
  Administered 2022-12-31 (×2): 0.9 mL via INTRAVENOUS

## 2022-12-27 MED ORDER — CEFTRIAXONE IV PUSH 1000 MG VIAL & NS (ADULTS)
INTRAVENOUS | Status: DC
Start: 2022-12-27 — End: 2022-12-28

## 2022-12-27 MED ORDER — MORPHINE 4 MG/ML INTRAVENOUS SOLUTION
4 mg/mL | Freq: Once | INTRAVENOUS | Status: CP
Start: 2022-12-27 — End: ?
  Administered 2022-12-28: 02:00:00 4 mL via INTRAVENOUS

## 2022-12-27 NOTE — ED Triage Note
 Provider Triage Note  27 y.o. year old female  presents with LLQ abdominal pain x 1.5 weeks.  Physical Exam: NAD Orders placed in triage: yes  Disposition: Main ED for further evaluation This initial provider screening is meant to expedite workup for patients and is not meant as definitive care. Please refer to subsequent notes for full medical evaluation of the patient.

## 2022-12-28 LAB — CBC WITH AUTO DIFFERENTIAL
BKR WAM ABSOLUTE IMMATURE GRANULOCYTES.: 0.03 x 1000/ÂµL (ref 0.00–0.30)
BKR WAM ABSOLUTE LYMPHOCYTE COUNT.: 3.81 x 1000/ÂµL — ABNORMAL HIGH (ref 0.60–3.70)
BKR WAM ABSOLUTE NRBC (2 DEC): 0 x 1000/ÂµL (ref 0.00–1.00)
BKR WAM ANC (ABSOLUTE NEUTROPHIL COUNT): 4.76 x 1000/ÂµL (ref 2.00–7.60)
BKR WAM BASOPHIL ABSOLUTE COUNT.: 0.03 x 1000/ÂµL (ref 0.00–1.00)
BKR WAM BASOPHILS: 0.3 % (ref 0.0–1.4)
BKR WAM EOSINOPHIL ABSOLUTE COUNT.: 0.13 x 1000/ÂµL (ref 0.00–1.00)
BKR WAM EOSINOPHILS: 1.4 % (ref 0.0–5.0)
BKR WAM HEMATOCRIT (2 DEC): 39 % (ref 35.00–45.00)
BKR WAM HEMOGLOBIN: 12.8 g/dL (ref 11.7–15.5)
BKR WAM IMMATURE GRANULOCYTES: 0.3 % (ref 0.0–1.0)
BKR WAM LYMPHOCYTES: 39.6 % (ref 17.0–50.0)
BKR WAM MCH (PG): 31.1 pg (ref 27.0–33.0)
BKR WAM MCHC: 32.8 g/dL (ref 31.0–36.0)
BKR WAM MCV: 94.7 fL (ref 80.0–100.0)
BKR WAM MONOCYTE ABSOLUTE COUNT.: 0.86 x 1000/ÂµL (ref 0.00–1.00)
BKR WAM MONOCYTES: 8.9 % (ref 4.0–12.0)
BKR WAM MPV: 10.8 fL (ref 8.0–12.0)
BKR WAM NEUTROPHILS: 49.5 % (ref 39.0–72.0)
BKR WAM NUCLEATED RED BLOOD CELLS: 0 % (ref 0.0–1.0)
BKR WAM PLATELETS: 183 x1000/ÂµL (ref 150–420)
BKR WAM RDW-CV: 12.3 % (ref 11.0–15.0)
BKR WAM RED BLOOD CELL COUNT.: 4.12 M/ÂµL (ref 4.00–6.00)
BKR WAM WHITE BLOOD CELL COUNT: 9.6 x1000/ÂµL (ref 4.0–11.0)

## 2022-12-28 LAB — CANDIDA & TRICHOMONAS VAGINITIS BY NAAT (BH GH LMW YH)
BKR CANDIDA GLABRATA NAAT: NEGATIVE
BKR CANDIDA SPECIES GROUP NAAT: NEGATIVE
BKR TRICHOMONAS VAGINALIS NAAT: NEGATIVE

## 2022-12-28 LAB — BASIC METABOLIC PANEL
BKR ANION GAP: 10 (ref 7–17)
BKR BLOOD UREA NITROGEN: 8 mg/dL (ref 6–20)
BKR BUN / CREAT RATIO: 9.9 (ref 8.0–23.0)
BKR CALCIUM: 8.7 mg/dL — ABNORMAL LOW (ref 8.8–10.2)
BKR CHLORIDE: 106 mmol/L (ref 98–107)
BKR CO2: 24 mmol/L (ref 20–30)
BKR CREATININE DELTA: 0.21
BKR CREATININE: 0.81 mg/dL (ref 0.40–1.30)
BKR EGFR, CREATININE (CKD-EPI 2021): >60 mL/min/{1.73_m2} (ref >=60)
BKR GLUCOSE: 75 mg/dL (ref 70–100)
BKR POTASSIUM: 4 mmol/L (ref 3.3–5.3)
BKR SODIUM: 140 mmol/L (ref 136–144)

## 2022-12-28 LAB — CHLAMYDIA TRACHOMATIS, NAAT (LAB ORDER ONLY) (BH GH L LMW YH): BKR CHLAMYDIA, DNA PROBE: NEGATIVE

## 2022-12-28 LAB — TREPONEMA PALLIDUM (SYPHILIS) ANTIBODY W/REFLEX
BKR TREPONEMA PALLIDUM ANTIBODY INITIAL RESULT: <0.1 {index}
BKR TREPONEMA PALLIDUM ANTIBODY TOTAL, SERUM: NONREACTIVE

## 2022-12-28 LAB — UA REFLEX CULTURE

## 2022-12-28 LAB — NEISSERIA GONORRHEA, NAAT (LAB ORDER ONLY)   (BH GH L LMW YH): BKR NEISSERIA GONORRHOEAE, DNA PROBE: NEGATIVE

## 2022-12-28 LAB — BACTERIAL VAGINOSIS BY NAAT: BKR BACTERIAL VAGINOSIS NAAT: POSITIVE — AB

## 2022-12-28 MED ORDER — MORPHINE 2 MG/ML INJECTION SYRINGE
2 mg/mL | INTRAVENOUS | Status: CP | PRN
Start: 2022-12-28 — End: 2022-12-31
  Administered 2022-12-28 – 2022-12-30 (×3): 2 mL via INTRAVENOUS

## 2022-12-28 MED ORDER — METRONIDAZOLE 500 MG/100 ML IN SODIUM CHLOR(ISO) INTRAVENOUS PIGGYBACK
500 | Freq: Two times a day (BID) | INTRAVENOUS | Status: DC
Start: 2022-12-28 — End: 2022-12-30
  Administered 2022-12-29 – 2022-12-30 (×3): 500 mL/h via INTRAVENOUS

## 2022-12-28 MED ORDER — MORPHINE 15 MG IMMEDIATE RELEASE TABLET
15 | ORAL | Status: DC | PRN
Start: 2022-12-28 — End: 2022-12-28

## 2022-12-28 MED ORDER — MORPHINE 15 MG IMMEDIATE RELEASE TABLET
15 mg | Freq: Four times a day (QID) | ORAL | Status: DC | PRN
Start: 2022-12-28 — End: 2022-12-28
  Administered 2022-12-28: 20:00:00 15 mg via ORAL

## 2022-12-28 MED ORDER — METOCLOPRAMIDE 5 MG/ML INJECTION SOLUTION
5 mg/mL | Freq: Four times a day (QID) | INTRAVENOUS | Status: CP | PRN
Start: 2022-12-28 — End: 2022-12-31
  Administered 2022-12-30 (×2): 5 mL via INTRAVENOUS

## 2022-12-28 MED ORDER — DOXYCYCLINE 100MG MBP
Freq: Two times a day (BID) | INTRAVENOUS | Status: DC
Start: 2022-12-28 — End: 2022-12-30
  Administered 2022-12-28 – 2022-12-30 (×4): 100.000 mL/h via INTRAVENOUS

## 2022-12-28 MED ORDER — METRONIDAZOLE 500 MG TABLET
500 mg | Freq: Two times a day (BID) | ORAL | Status: DC
Start: 2022-12-28 — End: 2022-12-28

## 2022-12-28 MED ORDER — ONDANSETRON HCL (PF) 4 MG/2 ML INJECTION SOLUTION
42 mg/2 mL | Freq: Three times a day (TID) | INTRAVENOUS | Status: CP | PRN
Start: 2022-12-28 — End: 2022-12-31
  Administered 2022-12-28 – 2022-12-31 (×4): 4 mL via INTRAVENOUS

## 2022-12-28 MED ORDER — DOXYCYCLINE TAB/CAP 100 MG (WRAPPED E-RX)
100 mg | Freq: Two times a day (BID) | ORAL | Status: DC
Start: 2022-12-28 — End: 2022-12-28

## 2022-12-28 NOTE — ED Notes
 Transfer from [facility] for gyn consult

## 2022-12-28 NOTE — Other
 Admission Note Nursing Mariah Ferguson is a 27 y.o. female admitted with a chief complaint of abdominal pain. Patient arrived from ED via stretcher and transport staff.Patient is alert and oriented to unit. Vitals:  12/27/22 1520 12/27/22 2257 12/27/22 2315 BP: 95/66 112/76 95/60 Pulse: 85 69 72 Resp: 18 16 19  Temp: 98.5 ?F (36.9 ?C) 97.7 ?F (36.5 ?C) 98 ?F (36.7 ?C) TempSrc:  Oral Oral SpO2: 100% 98% 100% Oxygen therapy Oxygen TherapySpO2: 100 %Device (Oxygen Therapy): room airI have reviewed the patient's current medication orders..I have reviewed patient valuables Belongings charted in last 7 days: Patient Valuables   Patient Valuables Flowsheet                    PATIENT VALUABLE(S)       Other disposition At bedside/locker/closet 12/27/22 2321  Cell phone disposition At bedside/locker/closet 12/27/22 2321          See flowsheets, patient education and plan of care for additional information. Plan of Care Overview/ Patient [phone]: Aox4.CV: no c/o chest pain.Resp: RA. Breaths even and unlabored. No c/o SOB.GI/GU: continent of b/b, LBM few days ago per patient. Urinary incontinence reported at times when pain is severe.Diet: regular.Meds: whole w/ water.Access: R20g.Mobility: SBA OOB.Skin: intact.Pain: c/o abd cramping and back pain, ATC tylenol, ibuprofen and PRN oxy given with positive effect.VS Q4hrs.PRN zofran given for hx of nausea from oxy.Safety maintained, bed locked in lowest position, bed alarm not indicated at this time, call light within reach, hourly rounding performed. See MAR and flowsheets for more.Problem: Adult Inpatient Plan of CareGoal: Plan of Care ReviewOutcome: Interventions implemented as appropriateGoal: Patient-Specific Goal (Individualized)Outcome: Interventions implemented as appropriateGoal: Absence of Hospital-Acquired Illness or InjuryOutcome: Interventions implemented as appropriateGoal: Optimal Comfort and WellbeingOutcome: Interventions implemented as appropriateGoal: Readiness for Transition of CareOutcome: Interventions implemented as appropriate Problem: Adult Inpatient Plan of CareGoal: Plan of Care ReviewOutcome: Interventions implemented as appropriateGoal: Patient-Specific Goal (Individualized)Outcome: Interventions implemented as appropriateGoal: Absence of Hospital-Acquired Illness or InjuryOutcome: Interventions implemented as appropriateGoal: Optimal Comfort and WellbeingOutcome: Interventions implemented as appropriateGoal: Readiness for Transition of CareOutcome: Interventions implemented as appropriate

## 2022-12-28 NOTE — ED Notes
 3:43 PM Pt presents to ED through walk-in for c/o lower abdominal pain iso PCOS x10 days. Pt reports she believes she is having a flair of her PCOS, known to have hemorrhagic cysts. States that pain feels similar to when they rupture, reports that she has been having difficulty ambulating d/t pain. Has not been formally dx w endometriosis. Pt not on birth control, no condom use, 1 sexual partner, states that she does not know if she is pregnant or not. Reports LMP 11/21. Pt states she has been experiencing intermittent incontinence from being in so much pain. Endorsing lower back pain on palpation as well. Abdomen is soft/non-distended, TTP in lower abdominal region. Pain w urination, denies burning or blood. Denies CP/SOB. PIV inserted, labs collected/sent. MD at bedside for eval.7:06 PM Verbal report given to on-coming RN. PT care transferred at this time.

## 2022-12-28 NOTE — Plan of Care
 Plan of Care Overview/ Patient Status0700 - 1900Pt is A&Ox4. VSS on RA. No c/o of pain, NVD, or SOB. PT is SBA OOB, voids spon to BR. Currently on reg diet, tolerating well. +BS LBM PTA. Skin is intact. RPIV in place, CDI, patent. Pt refused PO abx, education provided, provider aware. Meds given per MAR, bed locked in lowest position, bed alarm on and audible, call bell within each.1146: pt c/o of nausea pains w episode of emesis. Given PRN zofran w/+ effect.1505: pt c/o of abd pain, givne PRN PO morphine w/- effect. 1558: pt vomited 200 ml. States the morphine didn't work contacted provider for IV pain meds. 1658: pt c/o of abd pain, given PRN IV morphine w/ + effectKyla Rogelia Rohrer, RN Problem: Adult Inpatient Plan of CareGoal: Plan of Care ReviewOutcome: Interventions implemented as appropriateGoal: Patient-Specific Goal (Individualized)Outcome: Interventions implemented as appropriateGoal: Absence of Hospital-Acquired Illness or InjuryOutcome: Interventions implemented as appropriateGoal: Optimal Comfort and WellbeingOutcome: Interventions implemented as appropriateGoal: Readiness for Transition of CareOutcome: Interventions implemented as appropriate Problem: Fall Injury RiskGoal: Absence of Fall and Fall-Related InjuryOutcome: Interventions implemented as appropriate

## 2022-12-28 NOTE — Plan of Care
 Problem: Adult Inpatient Plan of CareGoal: Readiness for Transition of CareOutcome: Initial problem identification Plan of Care Overview/ Patient StatusCase Management Screening and Evaluation  Flowsheet Row Most Recent Value Case Management Screening: Chart review completed. If YES to any question below then proceed to CM Eval/Plan  Is there a change in their cognitive function No Do you anticipate that the pt will have any discharge needs requiring CM intervention? No Has there been an unscheduled readmission within the last 30 days and/or four (4) encounters (encounters include: ED, OBS, Inpatient) within the last six (6) months? Yes Were there services prior to admission ( Examples: Assisted Living, HD, Homecare, Extended Care Facility, Methadone, SNF, Outpatient Infusion Center) No Negative/Positive Screen Positive Screening: Complete CM Evaluation and Plan If Applies:  Patient was identified during screening to be without a PCP and team updated during rounds. Case manager will take lead to arrange post-acute care services and continue to work with the care team as the patient progresses towards discharge Yes Case Manager Attestation  I have reviewed the medical record and completed the above screen. CM staff will follow patient's progress and discuss the plan of care with the Treatment Team. Yes Case Management Evaluation and Plan  Arrived from prior to admission home/apartment/condo Admitted from: Home Do you have a caregiver, or do you anticipate the need for a caregiver given the change in your physicial function? No  Lives With Alone Services Prior to Admission none Patient Requires Care Coordination Intervention Due To discharge planning needs/concerns Prior to Hospitalization: Assistance Needed/DME being used None Documented Insurance Accurate Yes Any financial concerns related to anticipated discharge needs No Patient's home address verified Yes Patient's PCP of record verified No Last Date Seen by PCP --  Laury Axon PCP] Source of Clinical History  Patient's clinical history has been reviewed and source of Information is: Medical record, Patient Case Manager Attestation  I have reviewed the medical record and completed the above evaluation with the following recommendations. Yes Discharge Planning Coordination Recommendations  Discharge Planning Coordination Recommendations Home with MD follow-up/No needs identified Case Manager reviewed plan of care/ continuum of care need's with  Patient   Pt Status: Inpatient Mariah Ferguson is a 27 y.o. G0P0000 with PMHx PCOS and L hydrosalpinxwho was admitted from ED on 12/5 for severe LLQ pain. TVUS findings of 4cm tubular structure, overall clinical picture concerning for PID with possible TOA.  Admitted ON with IV Ceftriaxone, Doxycycline, flagyl. Currently managing with Tylenol, motrin, and oxy SS. 8mg  total IV morphine given while in ED. She required 1 dose 5mg  oxycodone at 0445. RN care coordinator spoke with pt on 12/28/22 and performed medical record review.Pt continue to endorse discomfort and pain despite pain analgesics at 5 AM.Pain  deemed by pt as a 6/10 in a numerical scale. Pt also has  numbness on left side radiating to leg. Pt has not ambulated today yet.  Pt denied sleeping over the left leg.  This Clinical research associate called pt's nurse to report numbness and pain at 10:10 AM.  Pt moved from [place] to [place] last year 2023 and need to secure a PCP.  Pt currently lives in [place] with her boyfriend and 2 additional roommates.Of note, PMH in 06/24/21 of -Trichomonas +. Pt and partner treated. Refer to notes from Edwyna Ready, MD dated 06/24/2021  at 6:51 PM. Partner was from [place]. Pt has a new partner since September 2024 here in [place].Pt educated about using condoms with intercourse to prevent STIs. Plan of care:Pt needs  a new PCP as her A1c was elevated on 11/24, A1c was 5.9. Pt to f/up with midwifery provider upon d/c from hospital. Appointment already scheduled to have a virtual f/up with CNM Villa Herb, CNM  on 01/02/23 at 2:30 PM   Pt has transportation via friends and boyfriend who ar available to pick her up upon d/c.Food insecurity present Needs food assistance- information for SNAP placed information in AVS for pt as well as food  pantries. 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 Informed pt to go to a counselor at [employer] and explain situation to see how she can enroll in school. Previously at [employer]. [name], RN, MSN, [facility] RN Care Coordinator.12/28/2022 at 1015 AM

## 2022-12-28 NOTE — Utilization Review (ED)
 Utilization Review from XsolisPatient Data:  Patient Name: Mariah Ferguson Age: 27 y.o. DOB: 01-Oct-1995	 MRN: [id number]	 Indicated Status: Inpatient Review Comments: Concern for PID vs TOA. GYN admit. IV abx, IV Morphine x2, Tylenol and Ibuprofen q6hr, antiemetics.

## 2022-12-28 NOTE — ED Notes
 7:14 PM Assuming care of patient at this time from outgoing RN. Patient resting in bed. Pain still present but better than was previously. 7:42 PMMade aware patient will be transferred to [address] [facility] at this time. Ambulance booked and [place]. Patient made aware she will be transferred there. 8:39 PMPatient transferred to [facility] At this time with [facility]

## 2022-12-28 NOTE — Plan of Care
 Plan of Care Overview/ Patient StatusProblem: Adult Inpatient Plan of CareGoal: Readiness for Transition of CareOutcome: Interventions implemented as appropriatePatient discussed in TCR, not medically ready at this time. Patient is a potential discharge for the weekend. Patient will discharge home with outpatient followup. No discharge needs identified. Family will provide transport home.Case Manager will follow patient's progress with patient's team and arrange post-acute care services as needed.Mariah Ferguson, MPH, [facility] Management Case Management Plan  Flowsheet Row Most Recent Value Discharge Planning  Patient/Patient Representative goals/treatment preferences for discharge are:  Home with outpatient followup Patient/Patient Representative was presented with a list of facilities, agencies and/or dme providers and Referral(s) placed for: None Mode of Transportation  Private car  (add comment for special considerations) Patient accompanied by Family/friend CM D/C Readiness  PASRR completed and approved N/A Authorization number obtained, if required N/A Is there a 3 day INPATIENT Qualifying stay for Medicare Patients? N/A Medicare IM- signed, dated, timed and scanned, if required N/A DME Authorized/Delivered N/A No needs identified/ follow up with PCP/MD Yes Post acute care services secured W10 complete N/A Pri Completed and Accepted  N/A Is the destination address correct on the W10 N/A Finalized Plan  Expected Discharge Date 12/29/22 Discharge Disposition Home or Self Care

## 2022-12-28 NOTE — Consults
 Gynecology Consult Note Consult Information: Consultation requested by: Lincoln Brigham, MDReason for consultation: Concern for PID vs TOAPresentation History: HPI: Mariah Ferguson is a 27 y.o. G0P0000 with hx of PCOS, CPP presenting for two weeks of ongoing LLQ pain. Patient reports that she has chronic generalized pelvic pain at baseline however two weeks ago she began to develop a more localized and severe stabbing pain in her LLQ. Describes this pain as similar to past experiences of her worsening pelvic pain secondary to ovulation and cyst rupturing but notes that her LMP was only 2wks ago. Denies any recent fever, chills, nausea/vomting, dizziness, lightheadedness. Given the ongoing pain and worsening throughout the day today, she present to [facility] ED for evaluation. At [facility] a pelvic exam revealed general pelvic pain and L adnexal tenderness. WBC and vitals were normal. She received a TVUS which revealed a 4.2cm serpinginous tubal structure concern for hydrosalpinx versus TOA. Given this finding she was transferred to [facility] for [employer]. In the ED here she endorses ongoing lower pelvic and LLQ pain that is stabbing in nature, 10/10, improved partially with IV narcotics. She denies any fevers, chills, nausea, vomiting, weakness, SOB, CP, dizziness. She also denies any recent off color or foul smelling vaginal discharge, dyspareunia, dysuria. She is sexually active with one female partner this year, last in November. Uses condoms consistently. No hx of prior STI and does not believe she has had any recent exposure.Review of Allergies/Medical History/Medications: Allergies:  No Known AllergiesPast Medical History: Past Medical History: Diagnosis Date  Hydrosalpinx 01/17/2022  Left, inpt admission 06/2021  PCOS (polycystic ovarian syndrome) 01/17/2022  Multiple follicles seen on [place]  PID (acute pelvic inflammatory disease)   06/2021  Pre-diabetes 01/24/2022  12/2021 HgbA1C 5.9% Repeat 58m, ordered  Right ovarian cyst 01/17/2022  01/17/22 ED [place]: right adnexa demonstrates 5.9 x 5.2 x 4.8 cm multiloculated cystic structure that may represent a combination of hemorrhagic ovarian cysts versus a hemorrhagic ovarian cyst and hematosalpinx. Rpt [place] in 1 month [ ]  ordered   Trichomonal infection   06/2021 Past Surgical History:Past Surgical History: Procedure Laterality Date  CYST REMOVAL   Family History: No family history on file.Social History:Social History Socioeconomic History  Marital status: Single   Spouse name: Not on file  Number of children: Not on file  Years of education: Not on file  Highest education level: Not on file Occupational History  Not on file Tobacco Use  Smoking status: Not on file  Smokeless tobacco: Not on file Substance and Sexual Activity  Alcohol use: Not on file  Drug use: Not on file  Sexual activity: Not on file Other Topics Concern  Not on file Social History Narrative  Not on file Social Drivers of Health Financial Resource Strain: Low Risk  (06/24/2021)  Overall Financial Resource Strain (CARDIA)   Difficulty of Paying Living Expenses: Not very hard Food Insecurity: No Food Insecurity (12/27/2022)  Hunger Vital Sign   Worried About Running Out of Food in the Last Year: Never true   Ran Out of Food in the Last Year: Never true Transportation Needs: No Transportation Needs (12/27/2022)  PRAPARE - Designer, jewellery (Medical): No   Lack of Transportation (Non-Medical): No Physical Activity: Not on file Stress: Not on file Social Connections: Not on file Intimate Partner Violence: Not on file Housing Stability: Low Risk  (12/27/2022)  Housing Stability   Housing Stability: I have a steady place to live   Housing Stability: Not on file  Prior to Admission Medications: Medications Prior to Admission Medication Sig Dispense Refill Last Dose/Taking  acetaminophen (TYLENOL) 325 mg tablet Take 2 tablets (650 mg total) by mouth every 6 (six) hours as needed. 30 tablet 0   ibuprofen (ADVIL,MOTRIN) 600 mg tablet Take 1 tablet (600 mg total) by mouth every 6 (six) hours as needed. 60 tablet 0   lidocaine 4 % topical patch Place 1 patch onto the skin every 24 hours. Remove & Discard patch within 12 hours or as directed by MD 30 patch 0   norethindrone-e.estradioL-iron (LOESTRIN 24 FE) 1 mg-20 mcg (24)/75 mg (4) tablet Take 1 tablet by mouth daily. Skip placebo pills. Please refill accordingly 112 tablet 3   oxyCODONE (ROXICODONE) 5 mg Immediate Release tablet Take 1 tablet (5 mg total) by mouth every 4 (four) hours as needed for pain for up to 6 doses. 6 tablet 0   polyethylene glycol (MIRALAX) 17 gram packet Take 1 packet (17 g total) by mouth daily. Mix in 8 ounces of water, juice, soda, coffee or tea prior to taking. 14 each 2  Physical Exam: Vitals:Temp:  [97.7 ?F (36.5 ?C)-98.5 ?F (36.9 ?C)] 98 ?F (36.7 ?C)Pulse:  [69-85] 72Resp:  [16-19] 19BP: (95-112)/(60-76) 95/60SpO2:  [98 %-100 %] 100 %Intake/Output:Gross Totals (Last 24 hours) at 12/28/2022 0001Last data filed at 12/27/2022 2356Intake 88.91 ml Output -- Net 88.91 ml Physical Exam:Patient exam or treatment required medical chaperone.The sensitive parts of the examination were performed with chaperone present: RN SashaGen- Uncomfortable appearing, NADHEENT- NC/AT, moist mucous membranesCV- RRR, nl S1 and S2, no M/R/GResp- Nl WOB, CTAB Abd- soft, TTP suprapubic and LLQ, non-distended; voluntary guarding, no rebound, no CVAT b/lGU- Bimanual: generalized pelvic pain; + CMT and L adnexal tenderness, no palpable massesExtremities- No edema b/l, no calf tenderness and neg Hoffmans b/l, Neuro- Alert and orientedReview of Labs/Diagnostics: Lab Review:CBC Diff Recent Labs   12/05/241538 WBC 7.3 HGB 13.4 HCT 40.60 PLT 175 MCV 95.5  Recent Labs   12/05/241538 NEUTROPHILS 49.0 MONOCYTES 8.7  BMP Ca/Mg/Phos Recent Labs   12/05/241538 NA 139 K 4.2 CL 107 CO2 23 BUN 9 CREATININE 0.60 GLU 88 ANIONGAP 9  Recent Labs   12/05/241538 CALCIUM 8.8 MG 2.0  Hepatic Coags Recent Labs   12/05/241538 ALT 9* AST 22 ALKPHOS 67 BILITOT 0.2 BILIDIR <0.1 ALBUMIN 4.3   No results for input(s): PTT, LABPROT, INR in the last 72 hours. Glucose Trop/CK/CKMB Recent Labs Lab 12/05/241538 GLU 88  No results for input(s): TROPONINT, CKTOTAL, CKMB in the last 72 hours. Microbiology:Lab Results Component Value Date  LABURIN  06/14/2022   Less than 10,000 CFU/mL. Clinical significance is unlikely for organism(s) present in quantities of less than 10,000 CFU/mL.  LABURIN No Growth 01/18/2022 Imaging:US Non-OB Transvaginal with Limited DopplerResult Date: 12/27/2022 ? Serpiginous tubular structure within the left pelvis, not obviously hyperemic. This could represent hydrosalpinx although tubo-ovarian abscess must be considered. ? 1.7 cm heterogenous right ovarian cystic structure, likely consistent with hemorrhagic cyst. Report initiated by:  Vincenza Hews, MD Reported and signed by: Virl Axe, MD  [facility] Radiology and Biomedical Imaging   Impression: Mariah Ferguson is a 27 y.o. presenting to the ED for 2wk worsening pelvic and LLQ pain. Gyn consulted for evaluation of pain in the setting of [place] finding concerning for TOA. Patient stable with normal vitals though with outgoing pain despite IV analgesia. On exam CMT and L adnexal tenderness. STI testing pending. Given TVUS findings of 4cm tubular structure, overall clinical picture concerning for PID with  possible TOA. Discussed findings with patient. Reassuringly she has been stable throughout the day with normal vitals and no leukocytosis. Will plan for admission to Gyn for IV abx and observation.Recommendations: -Start IV Ceftriaxone, Doxycycline, PO flagyl-Admit to Gyn for observationDiscussed with Dr. Barnie Alderman.Signed: Laurena Slimmer Andersen Iorio, MDObstetrics and Gynecology12/6/202412:01 AM For questions please contact: [phone]

## 2022-12-28 NOTE — ED Provider Notes
 Chief Complaint Patient presents with  Abdominal Pain   From home c/o LLQ ain x10 days. Hx of PCOS and feeling similar. Known to have a cyst to this area. LMP 11/21, one day of bleeding, abn for patient. Unsure pregnancy. +Nausea.  HPI/PE:--------------------Transfer from [facility]: 27 yo with PCOS, hemorrhagic ovarian cysts, PID presents to [facility] ED with intermittent abdominal pain for 2 weeks.Exam significant for lower abdominal and suprapubic tenderness to palpation.Lab work unrevealing without significant leukocytosis, urine pregnancy negative, urine does not appear infected.Transvaginal ultrasound shows serpiginous tubular structure within the left pelvis, concern for possible hydrosalpinx or TOA, and also a heterogeneous right ovarian cyst measuring 1.7 cm.  No obvious large cyst seen on the left ovary.  Patient received 4 mg of morphine at [facility], patient presents now with recurrence of significant pain in the lower abdomen radiating to the flank bilaterally. Spoke with Gynecology resident, pending evaluation.Discussed with gynecology, concern for PID/TOA. Will start antibiotics, admit to gynecology. Mariah Ferguson, PAMDM:-------------------------------------------Patient signed out to me by, Mariah Hurdle, PA-C at 10:30 AM pending inpatient placement.Briefly, this is Mariah Ferguson, a 27 y.o. female presenting to the ED for evaluation as a transfer from [facility]: 27 yo with PCOS, hemorrhagic ovarian cysts, PID presents to [facility] ED with intermittent abdominal pain for 2 weeks. Exam significant for lower abdominal and suprapubic tenderness to palpation. Lab work unrevealing without significant leukocytosis, urine pregnancy negative, urine does not appear infected.Patient currently admitted to [facility] service.Vitals available at dispo - stable, BP: 95/60 (12/27/2022 11:15 PM), Pulse: 72 (12/27/2022 11:15 PM), Temp: 98 ?F (36.7 ?C) (12/27/2022 11:15 PM), Temp src: Oral (12/27/2022 11:15 PM), Resp: 19 (12/27/2022 11:15 PM), SpO2: 100 % (12/27/2022 11:15 PM) Malachi Pro, PA-C-------------------------------------------  Physical ExamED Triage Vitals [12/27/22 1520]BP: 95/66Pulse: 85Pulse from  O2 sat: n/aResp: 18Temp: 98.5 ?F (36.9 ?C)Temp src: n/aSpO2: 100 % BP 95/66  - Pulse 85  - Temp 98.5 ?F (36.9 ?C)  - Resp 18  - SpO2 100% Physical Exam ProceduresAttestation/Critical CarePatient Reevaluation: From home c/o LLQ ain x10 days. Hx of PCOS and feeling similar. Known to have a cyst to this area. LMP 11/21, one day of bleeding, abn for patient. Unsure pregnancy. +Nausea27 F history of Polycystic Ovary Syndrome (PCOS) and hemorrhagic cysts, pelvic inflammatory disease, hydrosalpinx, presents with intermittent abdominal pain that has been ongoing for approximately two weeks. The pain has been severe enough to affect her mobility, making it difficult for her to walk at times. She also reports experiencing nausea and occasional vomiting. The patient mentions a recent abnormal menstrual period that lasted only a day, which is unusual for her 11/24. She took a pregnancy test, but the results were negative. She also reports experiencing pain during urination and occasional incontinence when the pain is severe, states this occurs when the cyst appears. The patient also mentions the possibility of endometriosis, although this has not been diagnosed.  Patient had similar episode and was seen 06/28/22, found at that time to have a left ovarian cyst.  Most recent STD workup in May was negative.Here patient found to have significant tenderness to palpation along the suprapubic and left lower quadrant, she does state that there is some radiation to her left flank.Ddx:  Ovarian cyst, ovarian torsion, ectopic pregnancy, TOA, PID, UTI, pyelonephritis, kidney stones, diverticulitis, diverticulosis, colitis.  Given prior history of PCOS and history of ovarian cyst, we will 1st elect to work her up for gyn pathology.Blood work at this time shows clean urine without any signs of blood or  infection.  Blood work otherwise is reassuring.  Pregnancy test is negative.  Transvaginal ultrasound shows serpiginous tubular structure within the left pelvis, concern for possible hydrosalpinx or TOA, and also a heterogeneous right ovarian cyst measuring 1.7 cm.  No obvious large cyst seen on the left ovary.  Spoke with OB, given patient's presentation, significant tenderness, and transvaginal ultrasound findings, we will transfer to [facility] for further evaluation.Comments as of 12/27/22 2230 Thu Dec 27, 2022 1752 [place] Non-OB Transvaginal with Limited Doppler? Serpiginous tubular structure within the left pelvis, not obviously hyperemic. This could represent hydrosalpinx although tubo-ovarian abscess must be considered.? 1.7 cm heterogenous right ovarian cystic structure, likely consistent with hemorrhagic cyst. [LZ] 2139 I took over as the ED attending of record upon transfer of this patient to [facility].Send for abd pain, pelvic US with ?TOA. Still in pain. Pending gyn consult recs [JB]  Comments User Index[JB] Rosette Reveal, MD[LZ] Mechele Claude, MD   Clinical Impressions as of 12/27/22 2230 LLQ pain Hydrosalpinx PID (acute pelvic inflammatory disease)  ED DispositionAdmit-------------------------------------------Patient signed out to me by, , PA-C at 10:40 PM pendingBriefly, this is Mariah Ferguson, a 27 y.o. female presenting to the ED for evaluation of . Workup so far reveals . Plan is for .Results/reassessment - Vitals available at dispo - Malachi Pro, PA-C-------------------------------------------   Mechele Claude, MD12/05/24 [address], Quentin Cornwall, MD12/05/24 1944 Mariah Ferguson, PA12/05/24 2139 Mariah Ferguson, PA12/05/24 2229 Toney Sang [place], PA12/05/24 2230 Cheron Every, PA12/05/24 2240 Malachi Pro [place], PA12/06/24 [phone]

## 2022-12-28 NOTE — Progress Notes
 Gynecology Consult Note Consult Information: Consultation requested by: Lincoln Brigham, MDReason for consultation: PID, c/f TOA. Presentation History: HPI: Mariah Ferguson is a 27 y.o. G0P0000 with PMHx PCOS and L hydrosalpinxwho was admitted from ED on 12/5 for severe LLQ pain. TVUS findings of 4cm tubular structure, overall clinical picture concerning for PID with possible TOA. Admitted ON with IV Ceftriaxone, Doxycycline, flagyl. Currently managing with Tylenol, motrin, and oxy SS. 8mg  total IV morphine given while in ED. She required 1 dose 5mg  oxycodone at 0445.Pain is well-controlled on current pain medications. No n/v, has not taken PO. She denies acute urinary symptoms, no dysuria or flank pain, though she reports chronic lower back pain and urge incontinence. She is sexually active with 1 female partner. Review of Allergies/Medical History/Medications: Allergies:  No Known AllergiesPast Medical History: Past Medical History: Diagnosis Date  Hydrosalpinx 01/17/2022  Left, inpt admission 06/2021  PCOS (polycystic ovarian syndrome) 01/17/2022  Multiple follicles seen on [place]  PID (acute pelvic inflammatory disease)   06/2021  Pre-diabetes 01/24/2022  12/2021 HgbA1C 5.9% Repeat 73m, ordered  Right ovarian cyst 01/17/2022  01/17/22 ED [place]: right adnexa demonstrates 5.9 x 5.2 x 4.8 cm multiloculated cystic structure that may represent a combination of hemorrhagic ovarian cysts versus a hemorrhagic ovarian cyst and hematosalpinx. Rpt [place] in 1 month [ ]  ordered   Trichomonal infection   06/2021 Past Surgical History:Past Surgical History: Procedure Laterality Date  CYST REMOVAL   Family History: No family history on file.Social History:Social History Socioeconomic History  Marital status: Single   Spouse name: Not on file  Number of children: Not on file  Years of education: Not on file  Highest education level: Not on file Occupational History  Not on file Tobacco Use  Smoking status: Not on file  Smokeless tobacco: Not on file Substance and Sexual Activity  Alcohol use: Not on file  Drug use: Not on file  Sexual activity: Not on file Other Topics Concern  Not on file Social History Narrative  Not on file Social Drivers of Health Financial Resource Strain: Low Risk  (06/24/2021)  Overall Financial Resource Strain (CARDIA)   Difficulty of Paying Living Expenses: Not very hard Food Insecurity: No Food Insecurity (12/27/2022)  Hunger Vital Sign   Worried About Running Out of Food in the Last Year: Never true   Ran Out of Food in the Last Year: Never true Transportation Needs: No Transportation Needs (12/27/2022)  PRAPARE - Designer, jewellery (Medical): No   Lack of Transportation (Non-Medical): No Physical Activity: Not on file Stress: Not on file Social Connections: Not on file Intimate Partner Violence: Not on file Housing Stability: Low Risk  (12/27/2022)  Housing Stability   Housing Stability: I have a steady place to live   Housing Stability: Not on file Prior to Admission Medications: Medications Prior to Admission Medication Sig Dispense Refill Last Dose/Taking  acetaminophen (TYLENOL) 325 mg tablet Take 2 tablets (650 mg total) by mouth every 6 (six) hours as needed. 30 tablet 0   ibuprofen (ADVIL,MOTRIN) 600 mg tablet Take 1 tablet (600 mg total) by mouth every 6 (six) hours as needed. 60 tablet 0   lidocaine 4 % topical patch Place 1 patch onto the skin every 24 hours. Remove & Discard patch within 12 hours or as directed by MD 30 patch 0   norethindrone-e.estradioL-iron (LOESTRIN 24 FE) 1 mg-20 mcg (24)/75 mg (4) tablet Take 1 tablet by mouth daily. Skip placebo pills. Please refill accordingly 112  tablet 3   oxyCODONE (ROXICODONE) 5 mg Immediate Release tablet Take 1 tablet (5 mg total) by mouth every 4 (four) hours as needed for pain for up to 6 doses. 6 tablet 0   polyethylene glycol (MIRALAX) 17 gram packet Take 1 packet (17 g total) by mouth daily. Mix in 8 ounces of water, juice, soda, coffee or tea prior to taking. 14 each 2  Physical Exam: Vitals:Temp:  [97.6 ?F (36.4 ?C)-98.5 ?F (36.9 ?C)] 97.6 ?F (36.4 ?C)Pulse:  [69-85] 76Resp:  [16-19] 19BP: (95-112)/(60-76) 97/67SpO2:  [98 %-100 %] 100 %Intake/Output:Gross Totals (Last 24 hours) at 12/28/2022 0620Last data filed at 12/28/2022 0020Intake 183.95 ml Output -- Net 183.95 ml Physical Exam:Gen- Well appearing, NAD, resting comfortably in bedHEENT- NC/AT, moist mucous membranesCV- RRRResp- Nl WOB on RAAbd- Voluntary guarding but, non-tender, non-distended, no rebound, no CVAT b/lGU- deferredExtremities- No edema b/l, no calf tenderness Neuro- Alert and orientedReview of Labs/Diagnostics: Lab Review:Recent Labs Lab 12/05/241538 12/06/240422 WBC 7.3 9.6 HGB 13.4 12.8 HCT 40.60 39.00 PLT 175 183  Recent Labs Lab 12/05/241538 12/06/240422 NEUTROPHILS 49.0 49.5  Recent Labs Lab 12/05/241538 12/06/240422 NA 139 140 K 4.2 4.0 CL 107 106 CO2 23 24 BUN 9 8 CREATININE 0.60 0.81 GLU 88 75  Recent Labs Lab 12/05/241538 12/06/240422 CALCIUM 8.8 8.7* MG 2.0  --   Recent Labs Lab 12/05/241538 ALT 9* AST 22 ALKPHOS 67 BILITOT 0.2 BILIDIR <0.1  No results for input(s): PTT, LABPROT, INR in the last 168 hours. Recent Labs Lab 12/05/241538 12/06/240422 GLU 88 75  Recent Labs Lab 12/05/241538 HCGQUANTIT <1   Diagnostic Review:US Non-OB Transvaginal with Limited DopplerResult Date: 12/27/2022 ? Serpiginous tubular structure within the left pelvis, not obviously hyperemic. This could represent hydrosalpinx although tubo-ovarian abscess must be considered. ? 1.7 cm heterogenous right ovarian cystic structure, likely consistent with hemorrhagic cyst. Report initiated by:  Vincenza Hews, MD Reported and signed by: Virl Axe, MD  [facility] Radiology and Biomedical Imaging  Impression: Mariah Ferguson is a 27 y.o. G0P0000 with PMHx PCOS and recurrent hemorrhagic cysts admitted for c/f PID with L hydrosalpinx raising concern for TOA. Reassured that pain is improved. We await G/C results; however, she is quite low risk for STI iso 1 sexual partner and no hx STI. Symptomatic improvement warrants switching to PO antibiotics. History of recurrent adnexal cysts may warrant hormone suppression.Recommendations: CV: H/H wnl, nl WBCNeuro: Tylenol, motrin, oxy SSID: afeb, NAICV: q4h VS  Resp:  ISFEN/GI: regular dietGU: PO doxy, flagyl x 14 d course, d/c ctx, discussed potential cyst suppression hormonal managementENDO: NAI Discussed with Dr. Dois Davenport.Signed: Jyl Heinz, MDObstetrics and Gynecology12/6/20246:20 Caryl Asp via M[facility]24-hr Gyn Consult: [phone] Chief: [phone]

## 2022-12-28 NOTE — ED Notes
 8:52 PM BIBA from [facility] for GYN consult r/o hemorrhagic cyst. Pt arrives tearful, endorsing severe pain - PA Fundin notified. POC pending eval8:59 PM PA Fundin at bedside9:21 PM Medicated per [facility] for 7/10 abd pain radiating to back. PIV patent dressing CDI. Denies nausea att. Pending GYN consult9:53 PM GYN at bedside10:12 PM Pt called this RN to bedside, requesting DC. MD Pifer notified10:50 PM NP 14 bed ready, pt notified and agreeable11:03 PM Transport at bedside to escort pt to NP14, 1 bag of personal belongings placed on stretcher for transport. Pt resting on stretcher in NAD w/cell phone on person. Infusion continues per [facility] w/o incidence. No further questions needs or complaints for this RN att. Care deferred to receiving RNFloor Handoff Telemetry: 	[]  Yes		[x]  NoCode Status:   [x]  Full		[]  DNR		[]  DNI		Other (specify):Safety Precautions: [x]  Fall Risk  []  Sitter   []  Restraints	[]  Suicidal	[]  None	Other (specify):Mentation/Orientation:	 A&O (Self, person, place, time) x    4      	 Disoriented to:          N/A          	 Special Accommodations: []  Hearing impaired   []  Blind  []  Nonverbal  []  Cognitive impairmentOxygenation Upon Admission: [x]  RA	[]  NC	[]  Venti  []  Simple Mask []  Other	Baseline O2 Status? [x]  Yes	[]  NoAmbulation: [x]  Independent ASSIST X1	[]  Cane   []  Walker	[]  Wheelchair	[]  Bedbound		[]  Hemiplegic	[]  Paraplegic	[]  QuadraplegicEliminiation: [x]  Independent ASSIST X1	[]  Commode	[]  Bedpan/Urinal  []  Straight Cath []  Foley cath			[]  Urostomy	[]  Colostomy	Other (specify):Diarrhea/Loose stool : []  1x within 24h  []  2x within 24h  []  3x within 24h  [x]  None 	C.Diff Order: 	[]  Ordered- needs to be collected             []  Collected-sent to lab             []  Resulted - Negative C.Diff             []  Resulted - Positive C.Diff[x]  Not Ordered   []  N/ASkin Alteration: []  Pressure Injury []  Wound []  None [x]  Skin not assessedDiet: [x]  Regular/No order placed	[]  NPO		Other (specify):IV Access: [x]  PIV   []  PICC    []  Port    [] Central line    []  A-line    Other (specify)IVF/GTT Running Upon ED Departure? []  No	    [x]  Yes (specify): VIBRAMYCIN PER MAROutstanding Meds/Treatments/Tests:Patient Belongings: PHONE, IPADAre the belongings documented?          [x]  No	    []  YesIs someone taking belongings home?   [x]  No     []  Yes  Who? (specify)                                   Alona Bene, RN

## 2022-12-29 LAB — CBC WITHOUT DIFFERENTIAL
BKR WAM ANC (ABSOLUTE NEUTROPHIL COUNT): 9.25 x 1000/ÂµL — ABNORMAL HIGH (ref 2.00–7.60)
BKR WAM HEMATOCRIT (2 DEC): 37.8 % (ref 35.00–45.00)
BKR WAM HEMOGLOBIN: 12.6 g/dL (ref 11.7–15.5)
BKR WAM MCH (PG): 31 pg (ref 27.0–33.0)
BKR WAM MCHC: 33.3 g/dL (ref 31.0–36.0)
BKR WAM MCV: 92.9 fL (ref 80.0–100.0)
BKR WAM MPV: 10.6 fL (ref 8.0–12.0)
BKR WAM PLATELETS: 183 x1000/ÂµL (ref 150–420)
BKR WAM RDW-CV: 12.1 % (ref 11.0–15.0)
BKR WAM RED BLOOD CELL COUNT.: 4.07 M/ÂµL (ref 4.00–6.00)
BKR WAM WHITE BLOOD CELL COUNT: 12.5 x1000/ÂµL — ABNORMAL HIGH (ref 4.0–11.0)

## 2022-12-29 MED ORDER — CEFTRIAXONE IV PUSH 1000 MG VIAL & NS (ADULTS)
INTRAVENOUS | Status: DC
Start: 2022-12-29 — End: 2022-12-30
  Administered 2022-12-29: 16:00:00 10.000 mL via INTRAVENOUS

## 2022-12-29 NOTE — Plan of Care
 Inpatient Physical Therapy Evaluation IP Adult PT Eval/Treat - 12/29/22 0923    Date of Visit / Treatment  Date of Visit / Treatment 12/29/22   Note Type Evaluation   Progress Report Due 01/12/23   Start Time 0843   End Time 0923   Total Treatment Time 40    General Information  Pertinent History Of Current Problem Per chart: Mariah Ferguson is a 26 y.o. G0P0000 with PMHx PCOS and recurrent hemorrhagic cysts admitted for c/f PID with L hydrosalpinx raising concern for TOA. She has remained afebrile and HDS. Pain is improving. Remains on IV antibiotics given significant nausea/emesis yesterday. IV CTX re-ordered, current regimen is CTX/doxy/flagyl. Plan to transition to PO tomorrow if tolerates food/drink today. Discussed history of significant nausea/emesis - would recommend seeing a GI outpatient. Plan to pre-medicate with antiemetics once on PO antibiotics. For LLE weakness and paresthesias, exam is overall reassuring. Suspect a component of pain limiting effort. Possible component of sciatica. Plan to consult PT for evaluation/recommendations, currently using a rolling walker. Can consider neurology consult if sx worsen.   Subjective agreeable to therapy   General Observations sitting upright in wheelchair on NP14, on RA, NAD; RN aware and cleared pt for therapy   Precautions/Limitations Fall Precautions;Bed alarm;Chair alarm    Weight Bearing Status  Weight Bearing Status WNL - Within normal limits    Prior Level of Functioning/Social History  Prior Level of Function independent with mobility;independent with ADLs   Patient resides with: Significant Other   boyfriend  Type of Home Multi-level house   Home Setup Steps to enter with rail(s);Full flight present;Bed/bath upstairs   10 STE with a railing  Equipment Utilized Prior to Admission/Treatment No device   Additional Comments Pt reports her boyfriend can assist her as needed.    Vital Signs and Orthostatic Vital Signs  Vital Signs Vital Signs Stable   Vital Signs Free text per chart, on RA    Pain/Comfort  Pain Comment (Pre/Post Treatment Pain) pt premedicated prior to session; pt denies pain at rest but endorses 8/10 L sided back pain that radiates down her leg with mobility, RN aware, positioned pt for comfort post session    Patient Coping  Observed Emotional State accepting;cooperative    Cognition  Overall Cognitive Status Within Functional Limits   Orientation Level Oriented X4   Level of Consciousness alert   Following Commands Follows all commands and directions without difficulty   Personal Safety / Judgment Fall risk   Cognition Comments pleasant and agreeable to participate in PT    Vision/ Hearing  Vision Assessment Results No vision deficits noted    Range of Motion  Range of Motion Examination bilateral upper extremity ROM was WFL;bilateral lower extremity ROM was [facility]    Manual Muscle Testing  Manual Muscle Testing Comments +deconditioning    Musculoskeletal  LUE Muscle Strength Grading 4-->active movement against gravity and resistance   RUE Muscle Strength Grading 4-->active movement against gravity and resistance   LLE Muscle Strength Grading 3-->active movement against gravity   RLE Muscle Strength Grading 4-->active movement against gravity and resistance    Muscle Tone  Muscle Tone Testing Results No muscle tone deficits noted    Coordination  Coordination Comments WFL    Peripheral Neurovascular  Peripheral Neurovascular Comments WNL    Sensory Assessment  Sensory Tests Results No sensory impairment noted   Sensory Assessment Comments intact to LT x 4; denies numbness and tingling    Skin Assessment  Skin Assessment  See Nursing Documentation    Balance  Balance Assist Device Rolling walker   Balance Skills Training Comment Ax1    Bed Mobility  Bed Mobility Comments steady sitting EOB; able to participate in BLE therex    Sit-Stand Transfer Training  Sit-to-Stand Transfer Independence/Assistance Level Contact guard;Assist of 1   Sit-to-Stand Transfer Assist Device Rolling walker   Stand-to-Sit Transfer Independence/Assistance Level Contact guard;Assist of 1   Stand-to-Sit Transfer Assist Device Rolling walker   Transfer Safety Analysis Impairments impaired balance;decreased strength   Sit-Stand Transfer Comments multiple trials; steady, no LOB; cueing for hand placement and sequencing   Stand-Sit Transfer Comments decreased eccentric control    Gait Training  Independence/Assistance Level  Contact guard;Assist of 1   Assistive Device  Rolling walker   Gait Distance 30 feet;25 feet   Gait Analysis Deviations decreased cadence;decreased step length;antalgic gait   Gait Analysis Impairments impaired balance;decreased strength;decreased endurance/activity tolerance   Ambulation distance was limited by: pain   Gait Training Comments slow, no overt LOB; cues for increased reliance on RW for decreased WBing through LLE with mobility    Stair Performance  Number of Stairs 1 FOS ascending and descending   Stair Railings present on left side;ascending   Independence/Assistance Level  Contact guard;Assist of 1   Assistive Device Rail   Stairs Impairments strength decreased;impaired balance   Technique step to step;BUE on 1 railing   Stair Performance Comment cues for sequencing and technique; fair tolerance    Handoff Documentation  Handoff Patient in bed;Bed alarm;Patient instructed to call nursing for mobility;Discussed with nursing   Handoff Comments RN present and aware    Activity Tolerance  Activity Tolerance Comments fair    PT- AM-PAC - Basic Mobility Screen- How much help from another person do you currently need.....  Turning from your back to your side while in a a flat bed without using rails? 4 - None - Does not require any help and does the activity independently. Can use assistive devices.   Moving from lying on your back to sitting on the side of a flat bed without using bed rails? 3 - A Little - Requires a little help (supervision, minimal assistance). Can use assistive devices.   Moving to and from a bed to a chair (including a wheelchair)? 3 - A Little - Requires a little help (supervision, minimal assistance). Can use assistive devices.   Standing up from a chair using your arms(e.g., wheelchair or bedside chair)? 3 - A Little - Requires a little help (supervision, minimal assistance). Can use assistive devices.   To walk in a hospital room? 3 - A Little - Requires a little help (supervision, minimal assistance). Can use assistive devices.   Climbing 3-5 steps with a railing? 3 - A Little - Requires a little help (supervision, minimal assistance). Can use assistive devices.   AMPAC Mobility Score 19   TARGET Highest Level of Mobility Mobility Level 6, Walk 10+steps   ACTUAL Highest Level of Mobility Mobility Level 7, Walk 25+ feet    Therapeutic Exercise  Therapeutic Exercise Comments AROM BUEs and BLEs to tolerance; reviewed and encouraged therex to perform t/o day as tolerated    Neuromuscular Re-education  Neuromuscular Re-education Comments static/dynamic sitting and standing balance training    Therapeutic Functional Activity  Therapeutic Functional Activity Comments transfer/gait/stair training, activity tolerance; educated pt on the role of PT and d/c planning    Clinical Impression  Initial Assessment 12/29/22: Patient seen for initial PT evaluation.  Pt benefits from CGA with RW for transfers/ambulation and CGA with a railing for stair negotiation at this time. Pt reports being independent at baseline. Patient is currently presenting to PT with pain and deficits of strength, balance, and activity tolerance. Will continue to follow while in house in order to optimize pt's mobility at time of d/c. Pt will benefit from RW, supervision/assist for mobility/ADLs, and physical therapy in d/c environment.   Rehab Potential good, to achieve stated therapy goals    Patient/Family Stated Goals  Patient/Family Stated Goal(s) return home;decrease pain    Frequency/Equipment Recommendations  PT Frequency 3x per week   Next Treatment Expected 12/31/22   PT/PTA completing this assessment LF    PT Recommendations for Inpatient Admission  Activity/Level of Assist out of bed;ambulate;assist of 1;with rolling walker;in room;in hall   ADL Recommendations assist to bathroom;assist of 1;with rolling walker   Therapeutic Exercise ROM as tolerated;encourage exercise program issued    Education - Learning Assessment   Education Topic Functional mobility techniques;ADL techniques;Safety;Precautions;Weight bearing;Positioning;Uses of devices/equipment;Exercise program;Energy conservation;Therapy role/rehab process;Discharge planning;Assistance/guarding   Learners Patient;Caregiver   Readiness Eager;Acceptance   Method Explanation;Demonstration   Response Verbalizes Understanding;Demonstrated Understanding    Planned Treatment / Interventions  Plan for Next Visit progress mobility as tolerated   Education Treatment / Interventions Patient Education / Chief of Staff / Training    PT Discharge Summary  Physical Therapy Disposition Recommendation Low complexity support and therapy to progress functional mobility/ ADLs/ IADLs recommended for post- acute care.  See assessment for additional details.   Additional Therapy Recommendations Physical Therapy Services in Discharge Environment   Equipment Recommendations for Discharge [facility] - The patient will use a Rolling Walker in the home and outside daily to provide greater stability and safer ambulation for participation in ADLs     Problem: Physical Therapy GoalsGoal: Physical Therapy GoalsDescription: PT Goals: 1. Patient will perform all bed mobility independently without assistive device so as to allow for repositioning. 2. Patient will perform sit to stand transfer independently with least restrictive device so as to prepare for ambulation. 3. Patient will independently ambulate 150 feet on flat, even surface with least restrictive device so as to allow for functional mobility.4. Patient will negotiate 1 flight of stairs independently so as to allow for safe discharge to home. Outcome: Initial problem identification Tito Dine, PT, DPT

## 2022-12-29 NOTE — Plan of Care
 Plan of Care Overview/ Patient Status0700 - 1900Pt is A&Ox4. VSS on RA. No c/o of pain, NVD, or SOB. Pt is SBA OOB w/RW, voids spon to BR. Currently on reg diet, tolerating well. +BS LBM 12/7. Skin is intact. RPIV in place, CDI, patent. IV abx given. Meds given per MAR, bed locked in lowest position, call bell within each, pt calls appropriately to get up. 1754: RPIV blew. IV abx paused until new IV attempt. 1848: LPIV CDI patent. IV abx resumed. Satira Sark, RN Problem: Adult Inpatient Plan of CareGoal: Plan of Care ReviewOutcome: Interventions implemented as appropriateGoal: Patient-Specific Goal (Individualized)Outcome: Interventions implemented as appropriateGoal: Absence of Hospital-Acquired Illness or InjuryOutcome: Interventions implemented as appropriateGoal: Optimal Comfort and WellbeingOutcome: Interventions implemented as appropriateGoal: Readiness for Transition of CareOutcome: Interventions implemented as appropriate Problem: Fall Injury RiskGoal: Absence of Fall and Fall-Related InjuryOutcome: Interventions implemented as appropriate

## 2022-12-29 NOTE — Progress Notes
 Gynecology - Progress NotePatient Data:  Patient Name: Mariah Ferguson Age: 27 y.o. DOB: [birth year]	 MRN: [id number]	  Hospital Course: Mariah Ferguson is a 27 y.o. G0P0000 admitted for LLQ pain and tubular structure c/f PID with possible TOAHospital LOS: 2 days  Subjective: -This morning, her pain is much improved from admission. -Ongoing nausea, states she is always nauseous. Several episodes of emesis yesterday, none yet today. Appetite is returning, planning to order lunch.-VS, passing flatus, having BM-Notes some L sided weakness with ambulation which she attributes to numbmess and pain. States this is a known problem for her - whenever she has a flare of pain or infection in her L pelvis, she has these same symptoms. Can ambulate independently at baseline but uses a family member's walker whenever this pain flares.  Objective: Vitals:Most recent: Patient Vitals for the past 24 hrs: BP Temp Temp src Pulse Resp SpO2 12/29/22 0542 98/63 98.3 ?F (36.8 ?C) Oral 77 14 99 % 12/28/22 2320 106/68 98 ?F (36.7 ?C) Oral 81 18 98 % 12/28/22 1957 120/67 98.1 ?F (36.7 ?C) Oral (!) 133 18 97 % 12/28/22 1634 107/70 98.9 ?F (37.2 ?C) Oral (!) 94 18 100 % I/O's:No intake or output data in the 24 hours ending 12/29/22 1134Physical Exam:GEN: Well appearing, NAD, lying comfortably in bedCV:  RRRPULM: nl WOBABD: +bowel sounds, soft, +LLQ TTP without rebound/guarding, non-distendedGu: deferredEXT: warm, well perfused, no leg tenderness NEURO: Alert and oriented to person place and time; LLE with subjective numbness on the lateral leg (thigh and shin), reduced strength throughout, normal DTR and no clonus. RLE normal sensation and strength. UE symmetric strength and sensationLabs:Recent Labs Lab 12/05/241538 12/06/240422 12/07/240547 WBC 7.3 9.6 12.5* HGB 13.4 12.8 12.6 HCT 40.60 39.00 37.80 PLT 175 183 183  Recent Labs Lab 12/05/241538 12/06/240422 NEUTROPHILS 49.0 49.5  Recent Labs Lab 12/05/241538 12/06/240422 NA 139 140 K 4.2 4.0 CL 107 106 CO2 23 24 BUN 9 8 CREATININE 0.60 0.81 GLU 88 75  Recent Labs Lab 12/05/241538 12/06/240422 CALCIUM 8.8 8.7* MG 2.0  --   Recent Labs Lab 12/05/241538 ALT 9* AST 22 ALKPHOS 67 BILITOT 0.2 BILIDIR <0.1  No results for input(s): PTT, LABPROT, INR in the last 168 hours. Recent Labs Lab 12/05/241538 12/06/240422 GLU 88 75    Imaging:No new imagingTVUS [place] Non-OB Transvaginal with Limited Doppler Result Date: 12/27/2022 ? Serpiginous tubular structure within the left pelvis, not obviously hyperemic. This could represent hydrosalpinx although tubo-ovarian abscess must be considered. ? 1.7 cm heterogenous right ovarian cystic structure, likely consistent with hemorrhagic cyst. Report initiated by:  Vincenza Hews, MD Reported and signed by: Virl Axe, MD  [facility] Radiology and Biomedical Imaging   Assessment & Plan: Mariah Ferguson is a 27 y.o. G0P0000 with PMHx PCOS and recurrent hemorrhagic cysts admitted for c/f PID with L hydrosalpinx raising concern for TOA. She has remained afebrile and HDS. Pain is improving. Remains on IV antibiotics given significant nausea/emesis yesterday. IV CTX re-ordered, current regimen is CTX/doxy/flagyl. Plan to transition to PO tomorrow if tolerates food/drink today. Discussed history of significant nausea/emesis - would recommend seeing a GI outpatient. Plan to pre-medicate with antiemetics once on PO antibiotics.For LLE weakness and paresthesias, exam is overall reassuring. Suspect a component of pain limiting effort. Possible component of sciatica. Plan to consult PT for evaluation/recommendations, currently using a rolling walker. Can consider neurology consult if sx worsenHeme:- No s/s active bleeding- AM CBC- VTE ppx: ambulation, SCDs in bedResp: - Stable on RA- IS Cardiac: - No risk factors- Monitor  VS q4hID: PID, c/f TOA-Hx of ongoing CPP, ovarian cysts -12/5 ED for 2wk worsening LLQ pain -> TVUS w/ 4cm tubal structure c/f hydrosalpinx vs TOA -+CMT and L adnexal tenderness -Adm WBC 7.3, afebrile -> -> WBC 12.5 (12/7) -Abx: IV CTX, IV Doxy, Flagyl (12/5-) -12/6  attempted PO abx but pt unable to tolerate -G/C/T neg []  trial PO antibiotics w antiemetics 12/8Neuro: LLE weakness/paresthesia-History of same with prior pelvic infections, per pt-Neuro exam overall reassuring, limited by pain[]  PT consult, rolling walker -Tylenol, ibuprofen ATC; morphine 2mg  q4h PRNFEN/ GI:- Regular diet - Mirakax, anti-emetics, simethiconeGU:- voiding spontaneously Ppx:- SCDs- Encourage ambulationDispo:- Pending PO antibiotics__________________________Caroline Leodis Liverpool, MD 12/7/202411:33 AM GYN contact: [phone]

## 2022-12-29 NOTE — Plan of Care
 Plan of Care Overview/ Patient Status1900 - 0700Patient A/Ox4.  VSS on RA.  Refused all PO meds.  Administered PRN Morphine x1 for pain.  Standby assist w/ walker.  LBM - prior to admission.  Up to toilet - voids spontaneously.  Regular diet - poor PO intake.  Skin is intact.  R PIV.  Bed alarm removed.  Call bell w/in reach.  Patient calls appropriately.

## 2022-12-29 NOTE — Care Coordination-Inpatient
 This Clinical research associate spoke to patient and she is in agreement with PT at home and does not have preference of an agency. Patient also would like a R/W. Referral sent o CS and provider updates regarding a script needed. [facility] HighsmithTransition Coordinator '

## 2022-12-30 MED ORDER — DOXYCYCLINE TAB/CAP 100 MG (WRAPPED E-RX)
100 mg | Freq: Two times a day (BID) | ORAL | Status: DC
Start: 2022-12-30 — End: 2022-12-31
  Administered 2022-12-30 – 2022-12-31 (×2): 100 mg via ORAL

## 2022-12-30 MED ORDER — METRONIDAZOLE 500 MG TABLET
500 mg | Freq: Two times a day (BID) | ORAL | Status: DC
Start: 2022-12-30 — End: 2022-12-31
  Administered 2022-12-30 – 2022-12-31 (×3): 500 mg via ORAL

## 2022-12-30 MED ORDER — CEFTRIAXONE IV PUSH 1000 MG VIAL & NS (ADULTS)
INTRAVENOUS | Status: DC
Start: 2022-12-30 — End: 2022-12-30

## 2022-12-30 NOTE — Progress Notes
 Gynecology - Progress NotePatient Data:  Patient Name: Mariah Ferguson Age: 27 y.o. DOB: [birth year]	 MRN: [id number]	  Hospital Course: Mariah Ferguson is a 27 y.o. G0P0000 admitted for LLQ pain and tubular structure c/f PID with possible TOAHospital LOS: 3 days  Subjective: -Pain continues to improve. No subjective fevers/chills-Was able to tolerate PO food yesterday without emesis. Ate rice/steak -VS, passing flatus, ha-LLE weakness is more due to fear than pain or muscular weakness - she is scared of falling in the hospital. States when she is at home in her sneakers with the guard rail on the stairs, she is much more comfortable walking.  Objective: Vitals:Most recent: Patient Vitals for the past 24 hrs: BP Temp Temp src Pulse Resp SpO2 12/29/22 2000 96/64 98.5 ?F (36.9 ?C) Oral 79 16 99 % 12/29/22 0542 98/63 98.3 ?F (36.8 ?C) Oral 77 14 99 % I/O's:Gross Totals (Last 24 hours) at 12/30/2022 0358Last data filed at 12/29/2022 1353Intake -- Output 350 ml Net -350 ml Physical Exam:GEN: Well appearing, NAD, lying comfortably in bedCV:  RRRPULM: nl WOBABD: +bowel sounds, soft, +LLQ TTP without rebound/guarding, non-distendedGu: deferredEXT: warm, well perfused, no leg tenderness NEURO: Alert and oriented to person place and timeLabs:Recent Labs Lab 12/05/241538 12/06/240422 12/07/240547 WBC 7.3 9.6 12.5* HGB 13.4 12.8 12.6 HCT 40.60 39.00 37.80 PLT 175 183 183  Recent Labs Lab 12/05/241538 12/06/240422 NEUTROPHILS 49.0 49.5  Recent Labs Lab 12/05/241538 12/06/240422 NA 139 140 K 4.2 4.0 CL 107 106 CO2 23 24 BUN 9 8 CREATININE 0.60 0.81 GLU 88 75  Recent Labs Lab 12/05/241538 12/06/240422 CALCIUM 8.8 8.7* MG 2.0  --   Recent Labs Lab 12/05/241538 ALT 9* AST 22 ALKPHOS 67 BILITOT 0.2 BILIDIR <0.1  No results for input(s): PTT, LABPROT, INR in the last 168 hours. Recent Labs Lab 12/05/241538 12/06/240422 GLU 88 75    Imaging:No new imagingTVUS [place] Non-OB Transvaginal with Limited Doppler Result Date: 12/27/2022 ? Serpiginous tubular structure within the left pelvis, not obviously hyperemic. This could represent hydrosalpinx although tubo-ovarian abscess must be considered. ? 1.7 cm heterogenous right ovarian cystic structure, likely consistent with hemorrhagic cyst. Report initiated by:  Vincenza Hews, MD Reported and signed by: Virl Axe, MD  [facility] Radiology and Biomedical Imaging   Assessment & Plan: Mariah Ferguson is a 27 y.o. G0P0000 with PMHx PCOS and recurrent hemorrhagic cysts admitted for c/f PID with L hydrosalpinx raising concern for TOA. She has remained afebrile and HDS. Pain is improving. Remains on IV antibiotics given significant nausea/emesis, however tolerated PO food yesterday. Plan to transition to PO antibiotics today. Will premedicate with antiemetics. For LLE weakness and paresthesias, exam is overall reassuring. Suspect a component of fear limiting effort. Possible component of sciatica. Appreciate continued PT. Heme:- No s/s active bleeding- AM CBC- VTE ppx: ambulation, SCDs in bedResp: - Stable on RA- IS Cardiac: - No risk factors- Monitor VS q4hID: PID, c/f TOA-Hx of ongoing CPP, ovarian cysts -12/5 ED for 2wk worsening LLQ pain -> TVUS w/ 4cm tubal structure c/f hydrosalpinx vs TOA -+CMT and L adnexal tenderness -Adm WBC 7.3, afebrile -> -> WBC 12.5 (12/7) -Abx: IV CTX, IV Doxy, Flagyl (12/5-) -12/6  attempted PO abx but pt unable to tolerate -G/C/T neg []  trial PO antibiotics w antiemetics Neuro: LLE weakness/paresthesia-History of same with prior pelvic infections, per pt-Neuro exam overall reassuring, limited by pain/fear of falling[]  PT following, rolling walker -Tylenol, ibuprofen ATC; morphine 2mg  q4h PRNFEN/ GI:- Regular diet - Miralax, anti-emetics, simethiconeGU:- voiding spontaneously Ppx:- SCDs- Encourage ambulationDispo:-  Pending PO antibiotics__________________________Caroline Leodis Liverpool, MD 12/8/202411:33 AM GYN contact: [phone]

## 2022-12-30 NOTE — Plan of Care
 Plan of Care Overview/ Patient Status1900 - 0700 Patient A/Ox4.  VSS on RA.  Administered scheduled IV antibiotics and PRN Morphine x1 for pain.  Standby assist w/ walker.  LBM - prior to admission.  Up to toilet - voids spontaneously.  Regular diet - poor PO intake.  Skin is intact.  L PIV.  Call bell w/in reach.  Patient calls appropriately.

## 2022-12-30 NOTE — Plan of Care
 Plan of Care Overview/ Patient Status0700 - 1900Pt is A&Ox4. VSS on RA. No c/o of pain, NVD, or SOB. Pt is SBA OOB w/RW, voids spon to BR. Currently on reg diet, tolerating well. +BS LBM 12/7. Skin is intact. LPIV in place, CDI, patent. PO abx given, pt tolerated well. Meds given per MAR, bed locked in lowest position, call bell within each, pt calls appropriately to get up.Satira Sark, RN Problem: Adult Inpatient Plan of CareGoal: Plan of Care ReviewOutcome: Interventions implemented as appropriateGoal: Patient-Specific Goal (Individualized)Outcome: Interventions implemented as appropriateGoal: Absence of Hospital-Acquired Illness or InjuryOutcome: Interventions implemented as appropriateGoal: Optimal Comfort and WellbeingOutcome: Interventions implemented as appropriateGoal: Readiness for Transition of CareOutcome: Interventions implemented as appropriate Problem: Fall Injury RiskGoal: Absence of Fall and Fall-Related InjuryOutcome: Interventions implemented as appropriate Problem: Physical Therapy GoalsGoal: Physical Therapy GoalsDescription: PT Goals: 1. Patient will perform all bed mobility independently without assistive device so as to allow for repositioning. 2. Patient will perform sit to stand transfer independently with least restrictive device so as to prepare for ambulation. 3. Patient will independently ambulate 150 feet on flat, even surface with least restrictive device so as to allow for functional mobility.4. Patient will negotiate 1 flight of stairs independently so as to allow for safe discharge to home. Outcome: Interventions implemented as appropriate

## 2022-12-31 DIAGNOSIS — Z28311 Partially vaccinated for covid-19: Secondary | ICD-10-CM

## 2022-12-31 DIAGNOSIS — N7093 Salpingitis and oophoritis, unspecified: Secondary | ICD-10-CM

## 2022-12-31 DIAGNOSIS — D72829 Elevated white blood cell count, unspecified: Secondary | ICD-10-CM

## 2022-12-31 DIAGNOSIS — N3941 Urge incontinence: Secondary | ICD-10-CM

## 2022-12-31 DIAGNOSIS — N739 Female pelvic inflammatory disease, unspecified: Secondary | ICD-10-CM

## 2022-12-31 LAB — CBC WITH AUTO DIFFERENTIAL
BKR WAM ABSOLUTE IMMATURE GRANULOCYTES.: 0.02 x 1000/ÂµL (ref 0.00–0.30)
BKR WAM ABSOLUTE LYMPHOCYTE COUNT.: 1.68 x 1000/ÂµL (ref 0.60–3.70)
BKR WAM ABSOLUTE NRBC (2 DEC): 0 x 1000/ÂµL (ref 0.00–1.00)
BKR WAM ANC (ABSOLUTE NEUTROPHIL COUNT): 5.47 x 1000/ÂµL (ref 2.00–7.60)
BKR WAM BASOPHIL ABSOLUTE COUNT.: 0.03 x 1000/ÂµL (ref 0.00–1.00)
BKR WAM BASOPHILS: 0.4 % (ref 0.0–1.4)
BKR WAM EOSINOPHIL ABSOLUTE COUNT.: 0.04 x 1000/ÂµL (ref 0.00–1.00)
BKR WAM EOSINOPHILS: 0.5 % (ref 0.0–5.0)
BKR WAM HEMATOCRIT (2 DEC): 38.1 % (ref 35.00–45.00)
BKR WAM HEMOGLOBIN: 13.4 g/dL (ref 11.7–15.5)
BKR WAM IMMATURE GRANULOCYTES: 0.2 % (ref 0.0–1.0)
BKR WAM LYMPHOCYTES: 20.9 % (ref 17.0–50.0)
BKR WAM MCH (PG): 32 pg (ref 27.0–33.0)
BKR WAM MCHC: 35.2 g/dL (ref 31.0–36.0)
BKR WAM MCV: 90.9 fL (ref 80.0–100.0)
BKR WAM MONOCYTE ABSOLUTE COUNT.: 0.8 x 1000/ÂµL (ref 0.00–1.00)
BKR WAM MONOCYTES: 10 % (ref 4.0–12.0)
BKR WAM MPV: 10.8 fL (ref 8.0–12.0)
BKR WAM NEUTROPHILS: 68 % (ref 39.0–72.0)
BKR WAM NUCLEATED RED BLOOD CELLS: 0 % (ref 0.0–1.0)
BKR WAM PLATELETS: 186 x1000/ÂµL (ref 150–420)
BKR WAM RDW-CV: 12.2 % (ref 11.0–15.0)
BKR WAM RED BLOOD CELL COUNT.: 4.19 M/ÂµL (ref 4.00–6.00)
BKR WAM WHITE BLOOD CELL COUNT: 8 x1000/ÂµL (ref 4.0–11.0)

## 2022-12-31 MED ORDER — ONDANSETRON 4 MG DISINTEGRATING TABLET
4 mg | Freq: Three times a day (TID) | Status: DC | PRN
Start: 2022-12-31 — End: 2022-12-31

## 2022-12-31 MED ORDER — METOCLOPRAMIDE 10 MG TABLET
10 | ORAL_TABLET | Freq: Four times a day (QID) | ORAL | 3 refills | Status: AC | PRN
Start: 2022-12-31 — End: ?

## 2022-12-31 MED ORDER — DOXYCYCLINE HYCLATE 100 MG CAPSULE
100 | ORAL_CAPSULE | Freq: Two times a day (BID) | ORAL | 1 refills | Status: AC
Start: 2022-12-31 — End: ?

## 2022-12-31 MED ORDER — ONDANSETRON 4 MG DISINTEGRATING TABLET
4 | ORAL_TABLET | Freq: Three times a day (TID) | 1 refills | Status: AC | PRN
Start: 2022-12-31 — End: ?

## 2022-12-31 MED ORDER — ONDANSETRON HCL (PF) 4 MG/2 ML INJECTION SOLUTION
42 mg/2 mL | Freq: Three times a day (TID) | INTRAVENOUS | Status: DC | PRN
Start: 2022-12-31 — End: 2022-12-31

## 2022-12-31 MED ORDER — METRONIDAZOLE 500 MG TABLET
500 | ORAL_TABLET | Freq: Two times a day (BID) | ORAL | 1 refills | Status: AC
Start: 2022-12-31 — End: ?

## 2022-12-31 MED ORDER — METOCLOPRAMIDE 10 MG TABLET
10 mg | Freq: Four times a day (QID) | ORAL | Status: DC | PRN
Start: 2022-12-31 — End: 2022-12-31
  Administered 2022-12-31: 10:00:00 10 mg via ORAL

## 2022-12-31 MED ORDER — ETONOGESTREL 0.12 MG-ETHINYL ESTRADIOL 0.015 MG/24 HR VAGINAL RING
13 refills | Status: AC
Start: 2022-12-31 — End: 2022-12-31

## 2022-12-31 MED ORDER — ETONOGESTREL 0.12 MG-ETHINYL ESTRADIOL 0.015 MG/24 HR VAGINAL RING
5 refills | Status: AC
Start: 2022-12-31 — End: ?

## 2022-12-31 MED ORDER — METOCLOPRAMIDE 5 MG/ML INJECTION SOLUTION
5 mg/mL | Freq: Four times a day (QID) | INTRAVENOUS | Status: DC | PRN
Start: 2022-12-31 — End: 2022-12-31

## 2022-12-31 NOTE — Plan of Care
 Plan of Care Overview/ Patient StatusPt A/Ox4, VSS sating well on RA. No c/o pain. Abd soft/nontender, +BS/+flatus. Pt tolerating regular diet. PO antiemetics given prior to PO abx per pt request however no c/o nausea/vomiting. Voiding spontaneously up to toilet. SBA OOB using RW. Skin intact, no edema. Call bell within reach, safety maintained. Please see pt education, flowsheets and POC for additional info. Problem: Adult Inpatient Plan of CareGoal: Plan of Care ReviewOutcome: Interventions implemented as appropriate

## 2022-12-31 NOTE — Discharge Instructions
 You were admitted to the hospital with a left-sided pelvic infection. You were treated with IV antibiotics and were switched to oral antibiotics. You should continue taking these antibiotics until 12/18 (until you run out of pills). You were also prescribed a Nuvaring. Insert the Nuvaring into the vagina and place it as far up as you can reach. It will sit over the cervix. Keep the ring in place for 3 weeks, then remove for 1 week, then replace with a new ring. If you would like to skip your period, you can keep the ring in place for 3 weeks, then immediately replace with a new ring. Please call or come to the ED if you develop fevers, chills, abnormal vaginal discharge, severe abdominal pain. You should schedule an appointment with your OBGYN in the next 2-4 weeks

## 2022-12-31 NOTE — Progress Notes
 Gynecology - Progress NotePatient Data:  Patient Name: Mariah Ferguson Age: 27 y.o. DOB: 07/15/1995	 MRN: [id number]	  Hospital Course: Mariah Ferguson is a 27 y.o. G0P0000 admitted for LLQ pain and tubular structure c/f PID with possible TOAHospital LOS: 4 days  Subjective: -Pain continues to improve. No subjective fevers/chills-Tolerating PO food and antibiotics-Ambulating much better -VS, passing flatus, having bowel movements-Feels ready for discharge-Wants Nuvaring - has been on OCP in the past but doesn't like swallowing pills  Objective: Vitals:Most recent: Patient Vitals for the past 24 hrs: BP Temp Temp src Pulse Resp SpO2 12/31/22 0449 107/68 98.5 ?F (36.9 ?C) Oral 67 16 99 % 12/30/22 2320 105/71 98.2 ?F (36.8 ?C) Oral 71 14 99 % 12/30/22 1951 113/72 98.4 ?F (36.9 ?C) Oral 76 18 97 % 12/30/22 1549 123/85 98.1 ?F (36.7 ?C) Oral 69 16 100 % 12/30/22 1207 91/62 98.2 ?F (36.8 ?C) Oral (!) 91 16 94 % 12/30/22 0750 99/66 98.2 ?F (36.8 ?C) Oral 89 18 98 % I/O's:Gross Totals (Last 24 hours) at 12/31/2022 0629Last data filed at 12/31/2022 0400Intake 720 ml Output -- Net 720 ml Physical Exam:GEN: Well appearing, NAD, lying comfortably in bedCV:  RRRPULM: nl WOBABD: +bowel sounds, soft, +LLQ TTP without rebound/guarding, non-distendedGu: deferredEXT: warm, well perfused, no leg tenderness NEURO: Alert and oriented to person place and timeLabs:Recent Labs Lab 12/05/241538 12/06/240422 12/07/240547 WBC 7.3 9.6 12.5* HGB 13.4 12.8 12.6 HCT 40.60 39.00 37.80 PLT 175 183 183  Recent Labs Lab 12/05/241538 12/06/240422 NEUTROPHILS 49.0 49.5  Recent Labs Lab 12/05/241538 12/06/240422 NA 139 140 K 4.2 4.0 CL 107 106 CO2 23 24 BUN 9 8 CREATININE 0.60 0.81 GLU 88 75  Recent Labs Lab 12/05/241538 12/06/240422 CALCIUM 8.8 8.7* MG 2.0  --   Recent Labs Lab 12/05/241538 ALT 9* AST 22 ALKPHOS 67 BILITOT 0.2 BILIDIR <0.1  No results for input(s): PTT, LABPROT, INR in the last 168 hours. Recent Labs Lab 12/05/241538 12/06/240422 GLU 88 75    Imaging:No new imagingTVUS [place] Non-OB Transvaginal with Limited Doppler Result Date: 12/27/2022 ? Serpiginous tubular structure within the left pelvis, not obviously hyperemic. This could represent hydrosalpinx although tubo-ovarian abscess must be considered. ? 1.7 cm heterogenous right ovarian cystic structure, likely consistent with hemorrhagic cyst. Report initiated by:  Vincenza Hews, MD Reported and signed by: Virl Axe, MD  [facility]   Assessment & Plan: Mariah Ferguson is a 27 y.o. G0P0000 with PMHx PCOS and recurrent hemorrhagic cysts admitted for c/f PID with L hydrosalpinx raising concern for TOA. She has remained afebrile and HDS. Pain is improving. Tolerating PO antibiotics. Appropriate for discharge today with 14d total course of antibiotics. Will rx Nuvaring on discharge, has no contraindications to estrogen.For LLE weakness and paresthesias, exam is overall reassuring. Symptoms are imrpoving. Appreciate continued PT. Has a rolling walker at home she can use if needed.Heme:- No s/s active bleeding- AM CBC- VTE ppx: ambulation, SCDs in bedResp: - Stable on RA- IS Cardiac: - No risk factors- Monitor VS q4hID: PID, c/f TOA-Hx of ongoing CPP, ovarian cysts -12/5 ED for 2wk worsening LLQ pain -> TVUS w/ 4cm tubal structure c/f hydrosalpinx vs TOA -+CMT and L adnexal tenderness -Adm WBC 7.3, afebrile -> -> WBC 12.5 (12/7) -Abx: IV CTX, IV Doxy, Flagyl (12/5-12/8) -> PO doxy/flagyl (12/8-)-G/C/T neg []  continue PO abx for 14d total courseNeuro: LLE weakness/paresthesia-History of same with prior pelvic infections, per pt-Neuro exam overall reassuring, limited by pain/fear of falling[]  PT following, rolling walker -Tylenol, ibuprofen ATC;  morphine 2mg  q4h PRNFEN/ GI:- Regular diet - Miralax, anti-emetics, simethiconeGU:- voiding spontaneously Ppx:- SCDs- Encourage ambulationDispo:- Discharge today__________________________Caroline Leodis Liverpool, MD 12/9/202411:33 AM GYN contact: [phone]

## 2022-12-31 NOTE — Plan of Care
 Plan of Care Overview/ Patient StatusProblem: Adult Inpatient Plan of CareGoal: Readiness for Transition of CareOutcome: Interventions implemented as appropriate Patient is medically ready for Discharge today to home with home care services provided by [facility].  Transportation will be provided by [facility].Pt will be receiving Home Care Services from [facility] Approved Agency. Provider needs to Enter a Referral 34 via their Discharge Navigator prior to Discharge. RN/BA to print W10 and Fax to [facility].Westley Hummer, MPH, [facility] ManagementCase Management Plan  Flowsheet Row Most Recent Value Discharge Planning  Patient/Patient Representative goals/treatment preferences for discharge are:  Home with homecare services and DME Patient/Patient Representative was presented with a list of facilities, agencies and/or dme providers and Referral(s) placed for: Homecare services, Durable medical equipment (add comment for specifics) Home Health Care Services Required Physical Therapy [facility] Equipment Needed After Discharge walker, rolling DME Delivered to bedside [facility] Mode of Transportation  Private car  (add comment for special considerations) Patient accompanied by Family/friend CM D/C Readiness  PASRR completed and approved N/A Authorization number obtained, if required N/A Is there a 3 day INPATIENT Qualifying stay for Medicare Patients? N/A Medicare IM- signed, dated, timed and scanned, if required N/A DME Authorized/Delivered Yes No needs identified/ follow up with PCP/MD N/A Post acute care services secured W10 complete Yes Pri Completed and Accepted  N/A Is the destination address correct on the W10 Yes Finalized Plan  Expected Discharge Date 12/31/22 Discharge Disposition Home-Health Care Svc

## 2022-12-31 NOTE — Other
 Estalee Markowicz was discharged via [employer] accompanied by Significant Other.  A&Ox4. VSS on RA, afebrile. No CP/shortness of breath. Denies pain. Tolerating PO intake. On reg diet. - denies N/V. Voiding spontaneously. +BS, +flatus.Skin intact.PIV removed prior to discharge OOB independent. Please see MAR and flowsheet for details. Verbalized understanding of discharge instructionsand recommended follow up care as per the after visit summary.  Written discharge instructions provided. Denies any further questions. Vital signs    Vitals:  12/30/22 1951 12/30/22 2320 12/31/22 0449 12/31/22 0912 BP: 113/72 105/71 107/68 102/61 Pulse: 76 71 67 84 Resp: 18 14 16 18  Temp: 98.4 ?F (36.9 ?C) 98.2 ?F (36.8 ?C) 98.5 ?F (36.9 ?C) 98.7 ?F (37.1 ?C) TempSrc: Oral Oral Oral Oral SpO2: 97% 99% 99% 100% Patient confirmed all belongings returned. Belongings charted in last 7 days: Patient Valuables   Patient Valuables Flowsheet                    PATIENT VALUABLE(S)       Other disposition At bedside/locker/closet 12/27/22 2321  Cell phone disposition At bedside/locker/closet 12/27/22 2321

## 2023-01-01 ENCOUNTER — Encounter: Admit: 2023-01-01 | Payer: PRIVATE HEALTH INSURANCE | Primary: Obstetrics and Gynecology

## 2023-01-02 ENCOUNTER — Encounter: Admit: 2023-01-02 | Payer: MEDICAID | Attending: Obstetrics and Gynecology | Primary: Obstetrics and Gynecology

## 2023-01-02 ENCOUNTER — Encounter: Admit: 2023-01-02 | Payer: PRIVATE HEALTH INSURANCE | Primary: Obstetrics and Gynecology

## 2023-01-02 DIAGNOSIS — N83201 Unspecified ovarian cyst, right side: Secondary | ICD-10-CM

## 2023-01-02 DIAGNOSIS — Z3009 Encounter for other general counseling and advice on contraception: Secondary | ICD-10-CM

## 2023-01-02 DIAGNOSIS — N730 Acute parametritis and pelvic cellulitis: Secondary | ICD-10-CM

## 2023-01-03 NOTE — Discharge Summary
 Gynecology Discharge Summary Summary Patient Data:  Patient Name: Mariah Ferguson Age: 27 y.o. DOB: [birth year]	 MRN: [id number]	 Admit date: 12/5/2024Discharge date: 12/9/24Discharge Attending Physician: No att. providers found  Principal Diagnosis: PID (acute pelvic inflammatory disease)Other Diagnosis: Discharged Condition: goodDisposition: Home  History: PMH PSH Past Medical History: Diagnosis Date  Hydrosalpinx 01/17/2022  Left, inpt admission 06/2021  PCOS (polycystic ovarian syndrome) 01/17/2022  Multiple follicles seen on [place]  PID (acute pelvic inflammatory disease)   06/2021  Pre-diabetes 01/24/2022  12/2021 HgbA1C 5.9% Repeat 40m, ordered  Right ovarian cyst 01/17/2022  01/17/22 ED [place]: right adnexa demonstrates 5.9 x 5.2 x 4.8 cm multiloculated cystic structure that may represent a combination of hemorrhagic ovarian cysts versus a hemorrhagic ovarian cyst and hematosalpinx. Rpt [place] in 1 month [ ]  ordered   Trichomonal infection   06/2021  Past Surgical History: Procedure Laterality Date  CYST REMOVAL    Social History Family History Social History Tobacco Use  Smoking status: Not on file  Smokeless tobacco: Not on file Substance Use Topics  Alcohol use: Not on file  No family history on file. Allergies No Known Allergies  Hospital Course Please refer to H&P dated 12/5 for full details regarding this patient's HPI. Briefly, this is a 27 y.o. F with hx of TOA, ovarian cysts, and chronic pelvic pain who presented for pelvic pain and was found to have recurrent TOA.Patient presented with 2 weeks of L sided pelvic pain. Workup revealed a 4cm left adnexal structure concerning for TOA. STI panel negative. She was admitted for empiric antibiotic treatment with CTX/doxy/flagyl. She remained afebrile and her mild leukocytosis resolved. Her pain subjectively improved. She has baseline nausea, poor PO intake, and difficulty swallowing pills, but was ultimately able to be transitioned to PO antibiotics which she tolerated well. She is discharged on oral antibiotics to complete a 14d total course. She was also started on Nuvaring on discharge - has previously tried OCPs but has difficulty with pills. No contraindications to estrogen. While in the hospital, she had left lower extremity paresthesias and some weakness which limited her ambulation. Neurologic examination demonstrated mild weakness but was limited by pain - no muscle atrophy and otherwise reassuring. Per patient, this happens every time she has a pelvic infection and is due to pain and fear of falling. She ambulates with a walker at home until her pain improves, then she ambulates without difficulty. On day of discharge, she was ambulating much better. She worked with PT during her hospitalization and was safe for discharge home.Pertinent lab findings and test results: -G/C/T neg-TVUS 4cm left adnexal serpiginous structureDischarge Discharge vitals: Blood pressure 102/61, pulse 84, temperature 98.7 ?F (37.1 ?C), temperature source Oral, resp. rate 18, last menstrual period 12/15/2022, SpO2 100%, not currently breastfeeding.Discharge Physical Exam:A physical exam was performed on the day of discharge. Please see the progress note from that day for details. Pending Labs and Tests: ISSUES TO BE ADDRESSED POST DISCHARGE: TOA: complete 14d course of antibiotics Baseline nausea/vomiting ?gastroparesis; GI referral placedDischarge Medications: Current Discharge Medication List  START taking these medications  Details doxycycline hyclate (VIBRAMYCIN) 100 mg capsule Take 1 capsule (100 mg total) by mouth every 12 (twelve) hours for 9 days.Qty: 18 capsule, Refills: 0Start date: 12/31/2022, End date: 01/09/2023  etonogestreL-ethinyl estradioL (NUVARING) 0.12-0.015 mg/24 hr vaginal ring Insert vaginally and leave in place for 3 consecutive weeks, then remove for 1 week.Qty: 3 each, Refills: 4Start date: 12/31/2022  metoclopramide HCl (REGLAN) 10 mg tablet Take 1 tablet (10 mg total)  by mouth every 6 (six) hours as needed for nausea or vomiting.Qty: 120 tablet, Refills: 2Start date: 12/31/2022  metroNIDAZOLE (FLAGYL) 500 mg tablet Take 1 tablet (500 mg total) by mouth every 12 (twelve) hours for 9 days.Qty: 18 tablet, Refills: 0Start date: 12/31/2022, End date: 01/09/2023  ondansetron (ZOFRAN-ODT) 4 mg disintegrating tablet Place 1 tablet (4 mg total) onto the tongue every 8 (eight) hours as needed for up to 7 days.Qty: 20 tablet, Refills: 0Start date: 12/31/2022, End date: 01/07/2023   CONTINUE these medications which have NOT CHANGED  Details acetaminophen (TYLENOL) 325 mg tablet Take 2 tablets (650 mg total) by mouth every 6 (six) hours as needed.Qty: 30 tablet, Refills: 0  ibuprofen (ADVIL,MOTRIN) 600 mg tablet Take 1 tablet (600 mg total) by mouth every 6 (six) hours as needed.Qty: 60 tablet, Refills: 0  lidocaine 4 % topical patch Place 1 patch onto the skin every 24 hours. Remove & Discard patch within 12 hours or as directed by MDQty: 30 patch, Refills: 0  oxyCODONE (ROXICODONE) 5 mg Immediate Release tablet Take 1 tablet (5 mg total) by mouth every 4 (four) hours as needed for pain for up to 6 doses.Qty: 6 tablet, Refills: 0  polyethylene glycol (MIRALAX) 17 gram packet Take 1 packet (17 g total) by mouth daily. Mix in 8 ounces of water, juice, soda, coffee or tea prior to taking.Qty: 14 each, Refills: 2   STOP taking these medications   norethindrone-e.estradioL-iron (LOESTRIN 24 FE) 1 mg-20 mcg (24)/75 mg (4) tablet    Follow-up Information:[facility] [place] [facility] [address] [phone] [address] [phone] an appointment as soon as possible for a visit in 2 week(s)For obgyn follow up [facility] Department of Gastroenterology[address], [address] [facility] [phone], Belenda Cruise, CNM[address] [place] [phone] Electronically Signed:Nashika Coker Leodis Liverpool, MD 01/02/2023  7:04 AMBest Contact Information: Gyn pager: [phone] :

## 2023-01-07 NOTE — Progress Notes
 [facility]12/11/2024TELEMED GYN FOLLOW-UP/PROBLEM VISIT VIDEO TELEHEALTH VISIT: This clinician is part of the telehealth program and is conducting this visit in a currently approved location. For this visit the clinician and patient were present via interactive audio & video telecommunications system that permits real-time communications, via the Alternative HIPAA Compliant platform.Patient's use of the telehealth platform followed consent and acknowledges agreement to permit telehealth for this visit. State patient is located in: [place] clinician is appropriately licensed in the above state to provide care for this visit. Other individuals present during the telehealth encounter and their role/relation: noneBecause this visit was completed over video, a hands-on physical exam was not performed.  Patient/parent or guardian understands and knows to call back if condition changes.HPI:  Mariah Ferguson presents for a follow-up after a recent hospitalization due to PID. She has a history of ovarian cysts and reports that the cysts seem to occur with each ovulation cycle, causing significant discomfort, including sharp cramps, nerve pain radiating down the leg, and pressure in the pelvic area. She mentions considering pregnancy as a means to stop ovulation and thus prevent the cysts. However, she is also concerned about the timing of pregnancy due to her current health and financial situation. Mariah Ferguson has been unable to work due to her health issues and has had to withdraw from an accelerated LPN program. She is currently on antibiotics following her hospitalization and reports improved pain. However, she is still experiencing side effects from the antibiotics, including nausea and diarrhea. Mariah Ferguson has previously tried various birth control pills to manage her cysts, but they caused severe vomiting. She is considering trying the NuvaRing as an alternative.   Assessment and Plan:  Ovarian CystsRecurrent cysts causing significant pain and impacting quality of life. Has tried multiple oral contraceptives with poor tolerance due to nausea and vomiting.-Start NuvaRing to suppress ovulation and prevent cyst formation.-Continue Zofran as needed for nausea.Recent Hx PID and Antibiotic-Associated Diarrheas/p hospitalization with IV and oral antibiotics leading to diarrhea and rectal discomfort.-Continue Zofran as needed for nausea.-Start probiotic to restore gut flora.-Apply diaper cream to protect skin and alleviate rectal discomfort.General Health Maintenance / Followup Plans-Schedule follow-up appointment in 4-6 weeks to assess response to NuvaRing, and discuss future family planning.   Villa Herb, CNM12/11/2022 (signed 12/16)Past Medical History: Diagnosis Date  Hydrosalpinx 01/17/2022  Left, inpt admission 06/2021  PCOS (polycystic ovarian syndrome) 01/17/2022  Multiple follicles seen on [place]  PID (acute pelvic inflammatory disease)   06/2021  Pre-diabetes 01/24/2022  12/2021 HgbA1C 5.9% Repeat 43m, ordered  Right ovarian cyst 01/17/2022  01/17/22 ED [place]: right adnexa demonstrates 5.9 x 5.2 x 4.8 cm multiloculated cystic structure that may represent a combination of hemorrhagic ovarian cysts versus a hemorrhagic ovarian cyst and hematosalpinx. Rpt [place] in 1 month [ ]  ordered   Trichomonal infection   06/2021 Past Surgical History: Procedure Laterality Date  CYST REMOVAL   OB History Gravida Para Term Preterm AB Living 0 0 0 0 0 0 SAB IAB Ectopic Molar Multiple Live Births 0 0 0 0 0 0

## 2023-01-09 ENCOUNTER — Encounter: Admit: 2023-01-09 | Payer: PRIVATE HEALTH INSURANCE | Primary: Obstetrics and Gynecology

## 2023-03-01 ENCOUNTER — Inpatient Hospital Stay: Admit: 2023-03-01 | Discharge: 2023-03-02 | Payer: MEDICAID | Attending: Emergency Medicine

## 2023-03-01 ENCOUNTER — Emergency Department: Admit: 2023-03-01 | Payer: MEDICAID | Primary: Obstetrics and Gynecology

## 2023-03-01 LAB — CBC WITH AUTO DIFFERENTIAL
BKR WAM ABSOLUTE IMMATURE GRANULOCYTES.: 0.04 x 1000/ÂµL (ref 0.00–0.30)
BKR WAM ABSOLUTE LYMPHOCYTE COUNT.: 3.25 x 1000/ÂµL (ref 0.60–3.70)
BKR WAM ABSOLUTE NRBC (2 DEC): 0 x 1000/ÂµL (ref 0.00–1.00)
BKR WAM ANC (ABSOLUTE NEUTROPHIL COUNT): 5.17 x 1000/ÂµL (ref 2.00–7.60)
BKR WAM BASOPHIL ABSOLUTE COUNT.: 0.02 x 1000/ÂµL (ref 0.00–1.00)
BKR WAM BASOPHILS: 0.2 % (ref 0.0–1.4)
BKR WAM EOSINOPHIL ABSOLUTE COUNT.: 0.1 x 1000/ÂµL (ref 0.00–1.00)
BKR WAM EOSINOPHILS: 1.1 % (ref 0.0–5.0)
BKR WAM HEMATOCRIT (2 DEC): 37.5 % (ref 35.00–45.00)
BKR WAM HEMOGLOBIN: 12.5 g/dL (ref 11.7–15.5)
BKR WAM IMMATURE GRANULOCYTES: 0.4 % (ref 0.0–1.0)
BKR WAM LYMPHOCYTES: 35.2 % (ref 17.0–50.0)
BKR WAM MCH (PG): 31.6 pg (ref 27.0–33.0)
BKR WAM MCHC: 33.3 g/dL (ref 31.0–36.0)
BKR WAM MCV: 94.9 fL (ref 80.0–100.0)
BKR WAM MONOCYTE ABSOLUTE COUNT.: 0.65 x 1000/ÂµL (ref 0.00–1.00)
BKR WAM MONOCYTES: 7 % (ref 4.0–12.0)
BKR WAM MPV: 10.5 fL (ref 8.0–12.0)
BKR WAM NEUTROPHILS: 56.1 % (ref 39.0–72.0)
BKR WAM NUCLEATED RED BLOOD CELLS: 0 % (ref 0.0–1.0)
BKR WAM PLATELETS: 180 x1000/ÂµL (ref 150–420)
BKR WAM RDW-CV: 11.9 % (ref 11.0–15.0)
BKR WAM RED BLOOD CELL COUNT.: 3.95 M/ÂµL — ABNORMAL LOW (ref 4.00–6.00)
BKR WAM WHITE BLOOD CELL COUNT: 9.2 x1000/ÂµL (ref 4.0–11.0)

## 2023-03-01 LAB — HCG, QUANTITATIVE     (BH GH LMW YH): BKR HCG, QUANTITATIVE: <1 m[IU]/mL

## 2023-03-01 LAB — BASIC METABOLIC PANEL
BKR ANION GAP: 14 (ref 7–17)
BKR BLOOD UREA NITROGEN: 9 mg/dL (ref 6–20)
BKR BUN / CREAT RATIO: 12.2 (ref 8.0–23.0)
BKR CALCIUM: 8.5 mg/dL — ABNORMAL LOW (ref 8.8–10.2)
BKR CHLORIDE: 104 mmol/L (ref 98–107)
BKR CO2: 22 mmol/L (ref 20–30)
BKR CREATININE DELTA: -0.07
BKR CREATININE: 0.74 mg/dL (ref 0.40–1.30)
BKR EGFR, CREATININE (CKD-EPI 2021): >60 mL/min/{1.73_m2} (ref >=60)
BKR GLUCOSE: 83 mg/dL (ref 70–100)
BKR POTASSIUM: 4.1 mmol/L (ref 3.3–5.3)
BKR SODIUM: 140 mmol/L (ref 136–144)

## 2023-03-01 MED ORDER — ACETAMINOPHEN 325 MG TABLET
325 | Freq: Once | ORAL | Status: DC
Start: 2023-03-01 — End: 2023-03-02

## 2023-03-01 MED ORDER — KETOROLAC 15 MG/ML INJECTION SOLUTION
15 | Freq: Once | INTRAVENOUS | Status: CP
Start: 2023-03-01 — End: ?
  Administered 2023-03-01: 21:00:00 15 mL via INTRAVENOUS

## 2023-03-01 MED ORDER — MORPHINE 4 MG/ML INTRAVENOUS SOLUTION
4 | Freq: Once | INTRAVENOUS | Status: CP
Start: 2023-03-01 — End: ?
  Administered 2023-03-01: 21:00:00 4 mL via INTRAVENOUS

## 2023-03-02 ENCOUNTER — Emergency Department: Admit: 2023-03-02 | Payer: MEDICAID | Primary: Obstetrics and Gynecology

## 2023-03-02 DIAGNOSIS — R1031 Right lower quadrant pain: Principal | ICD-10-CM

## 2023-03-02 DIAGNOSIS — G8929 Other chronic pain: Secondary | ICD-10-CM

## 2023-03-02 LAB — URINE MICROSCOPIC     (BH GH LMW YH)

## 2023-03-02 LAB — URINALYSIS WITH CULTURE REFLEX      (BH LMW YH)
BKR BILIRUBIN, UA: NEGATIVE
BKR BLOOD, UA: NEGATIVE
BKR GLUCOSE, UA: NEGATIVE
BKR LEUKOCYTE ESTERASE, UA: NEGATIVE
BKR NITRITE, UA: NEGATIVE
BKR PH, UA: 7 (ref 5.5–7.5)
BKR SPECIFIC GRAVITY, UA: >1.05 — ABNORMAL HIGH (ref 1.005–1.030)
BKR UROBILINOGEN, UA (MG/DL): <2 mg/dL (ref <=2.0)

## 2023-03-02 LAB — UA REFLEX CULTURE

## 2023-03-02 MED ORDER — SODIUM CHLORIDE 0.9 % LARGE VOLUME SYRINGE FOR AUTOINJECTOR
Freq: Once | INTRAVENOUS | Status: CP | PRN
Start: 2023-03-02 — End: ?
  Administered 2023-03-02: 07:00:00 via INTRAVENOUS

## 2023-03-02 MED ORDER — ONDANSETRON HCL (PF) 4 MG/2 ML INJECTION SOLUTION
42 | Freq: Once | INTRAVENOUS | Status: CP
Start: 2023-03-02 — End: ?
  Administered 2023-03-02: 04:00:00 42 mL via INTRAVENOUS

## 2023-03-02 MED ORDER — MORPHINE 4 MG/ML INTRAVENOUS SOLUTION
4 | SUBCUTANEOUS | Status: DC | PRN
Start: 2023-03-02 — End: 2023-03-02

## 2023-03-02 MED ORDER — IOHEXOL 350 MG IODINE/ML INTRAVENOUS SOLUTION
350 | Freq: Once | INTRAVENOUS | Status: CP | PRN
Start: 2023-03-02 — End: ?
  Administered 2023-03-02: 07:00:00 350 mL via INTRAVENOUS

## 2023-03-02 MED ORDER — SODIUM CHLORIDE 0.9 % IV BOLUS NEW BAG (DROPS CHARGE)
0.9 | Freq: Once | INTRAVENOUS | Status: CP
Start: 2023-03-02 — End: ?
  Administered 2023-03-02: 08:00:00 0.9 mL/h via INTRAVENOUS

## 2023-03-02 MED ORDER — IOHEXOL (OMNIPAQUE) 300 MG/ML ORAL SOLUTION 30 ML IN WATER 900 ML
Freq: Once | ORAL | Status: DC | PRN
Start: 2023-03-02 — End: 2023-03-02

## 2023-03-02 NOTE — ED Notes
 0340After giving patient oral contrast patient noted vomiting informed MD who will order antiemetic.

## 2023-03-02 NOTE — Discharge Instructions
 Your [place] showed fluid in your left fallopian tube. This is likely left over from your recent pelvic infection. Please follow up with your gynecologist next week for further evaluation. Return to the ED if you develop fevers, chills, vomiting, worsening pain, or you have any other concerns.

## 2023-03-02 NOTE — ED Notes
 3:15 AM  - Oral contrast start time

## 2023-03-02 NOTE — ED Provider Notes
 Chief Complaint Patient presents with  Abdominal Pain   BIBA with c/o lower right abdominal pain concerns for recurrent ruptured ovarian cyst, last cyst removed 2024. Patient in obvious distress, no nausea or vomiting. HPI/PE:28 y.o. F with hx of tubo-ovarian abscess (TOA), right ovarian cysts, PCOS, PID (12/2022), and chronic pelvic pain who presented for pelvic pain and was found to have recurrent TOA who comes in today with complaints of lower right abdominal pain. She mentions that her pain started at 3 pm today, and she tried to withstand the pain but could not bare the pain anymore once it started affecting her mobility. She says this is the worst pain she has had, and it is worse than her prior TOA pain. She does not take any medications for the pain. She denies fever and nausea.   Physical ExamED Triage Vitals [03/01/23 2021]BP: 127/81Pulse: 76Pulse from  O2 sat: n/aResp: (!) 22Temp: 97.9 ?F (36.6 ?C)Temp src: OralSpO2: 100 % BP 108/65  - Pulse 75  - Temp 98 ?F (36.7 ?C) (Oral)  - Resp 18  - LMP 02/14/2023  - SpO2 99% Physical ExamCardiovascular:    Rate and Rhythm: Normal rate and regular rhythm. Pulmonary:    Effort: Pulmonary effort is normal.    Breath sounds: Normal breath sounds. Abdominal:    General: Abdomen is flat.    Tenderness: There is abdominal tenderness in the right lower quadrant. Skin:   General: Skin is warm and dry.  ProceduresAttestation/Critical CarePatient Reevaluation: Attending Supervised: ResidentI saw and examined the patient. I agree with the findings and plan of care as documented in the resident's note except as noted below. Additional acute and/or chronic problems addressed: 28 year old female, PMHx PCOS, ovarian cyst, chronic pelvic pain, presenting with pelvic pain, recently treated for TOA in December 2024, started having pelvic pain at 3pm which she described as very severe; received fentanyl and acetaminophen en route by EMS. I evaluated patient after she received morphine, she was resting comfortably in no acute distress. Had focal tenderness to RLQ just above inguinal crease. No rebound or guarding. VSS, otherwise clinically well appearing. Signed out to Dr. Irish Lack pending US transvaginal results; if negative, would consider Sanders abdomen to evaluate for appendicitis.Rowe Clack SurapaneniComments as of 03/02/23 1801 Fri Mar 01, 2023 2316 Received signout on pt:Hx:  28 year old female with chronic pelvic pain PCOS bilateral hemorrhagic cysts, coming in with right lower quadrant tenderness, given a history we will start with transvaginal ultrasoundTx:  Morphine, Toradol, TylenolPending:  Transvaginal ultrasound [AM] Sat Mar 02, 2023 0707 Assumed care. Abd pain with imaging pending. Pain control.Donella Stade. Pascuala Klutts, MD, MS  [AT] [phone] Received sign out on this patient from outgoing resident. Briefly, pt here with hx of TOA, right ovarian cyst coming in with RLQ/pelvic pain. Had negative [place] and pending [place] read. Pain controlled with morphine. [RM] 0750 [place] Abdomen Pelvis w IV Contrast with oral contrast Final Result    1. Wall thickening of the bladder for which correlation with urinalysis is recommended for possible UTI/cystitis.  2. Findings within the pelvis as detailed above suggesting pelvic congestion/PID including previously noted left hydrosalpinx.        [place] [facility] Notify System Classification: Routine.    Report initiated by:  Tessie Eke, RRA    Reported and signed by: Park Meo, MD      [facility] Radiology and Biomedical Imaging   [place] Non-OB Transvag and [employer] Doppler Final Result  No sonographic evidence of ovarian torsion.  1.3 cm right paraovarian cyst.    A tubular structure within the left adnexa is less prominent, which may represent mild hydrosalpinx containing debris.    Nonspecific small volume of pelvic free fluid.    [place] Rad[facility] Notify System Classification: Routine.    Report initiated by:  Halford Chessman, MD    Reported and signed by: Park Meo, MD      [facility] Radiology and Biomedical Imaging    [AT] 1015 On exam patient pain is RLQ. States she has had some difficulty controlling urination since ovarian cystectomy.Abd exam non-peritoneal, some RLQT. LLQ without TTP.U/A reviewed, bland.Has Gyn F/U 2/12.Return precautions provided.Donella Stade. Demetrio Leighty, MD, MS  [AT] [phone] [place] showing left hydrosalpinx and findings consistent with recent PID infection for which she was treated. Pain is right sided though she is able to rest comfortably. UA is without UTI. She has gyn follow up next week. I gave her strict return precautions. Discharged. [RM]  Comments User Index[AM] Mansour, Marney Doctor, MD[AT] Nicola Girt Donella Stade, MD[RM] Jannifer Franklin, MD   Clinical Impressions as of 03/02/23 1801 Pelvic pain  Assessment and plan28 y.o. F with hx of tubo-ovarian abscess (TOA), right ovarian cysts, PCOS, PID (12/2022), and chronic pelvic pain who presented for pelvic pain and was found to have recurrent TOA who comes in today with complaints of lower right abdominal pain. She mentions that her pain started at 3 pm today, and she tried to withstand the pain but could not bare the pain anymore once it started affecting her mobility. She says this is the worst pain she has had, and it is worse than her prior TOA pain. She does not take any medications for the pain. She denies fever and nausea. Physical exam is remarkable for pain in the RLQ with presence of guarding. -CBC, BMP-[place] AP-15mg  toradol + 4mg  IV morphine  Corena Pilgrim MDAnesthesiology PGY1ED DispositionDischarge  Corena Pilgrim, MDResident02/07/25 2130 Johann Capers, MD02/08/25 1251 Leretha Dykes, MD02/08/25 1191

## 2023-03-02 NOTE — ED Notes
 0045Patient transported to [place]

## 2023-03-02 NOTE — ED Notes
 2030Pt arrives to ED with c/o Abd pain. Patient is coming into ed with abd pain 10/10 has hx of ovarian cyst. Patient did received of fentanyl and tylenol by EMS. Patient vitals are stable safety maintained plan of care is ongoing.Chief Complaint Patient presents with  Abdominal Pain   BIBA with c/o lower right abdominal pain concerns for recurrent ruptured ovarian cyst, last cyst removed 2024. Patient in obvious distress, no nausea or vomiting. Past Medical History: Diagnosis Date  Hydrosalpinx 01/17/2022  Left, inpt admission 06/2021  PCOS (polycystic ovarian syndrome) 01/17/2022  Multiple follicles seen on [place]  PID (acute pelvic inflammatory disease)   06/2021  Pre-diabetes 01/24/2022  12/2021 HgbA1C 5.9% Repeat 20m, ordered  Right ovarian cyst 01/17/2022  01/17/22 ED [place]: right adnexa demonstrates 5.9 x 5.2 x 4.8 cm multiloculated cystic structure that may represent a combination of hemorrhagic ovarian cysts versus a hemorrhagic ovarian cyst and hematosalpinx. Rpt [place] in 1 month [ ]  ordered   Trichomonal infection   06/2021

## 2023-03-02 NOTE — ED Notes
 7:12 AM Verbal report received from off-going RN. This RN assumed care of PT at this time. Pt presents to ED C/O abdominal pain, PMH of ovarian cysts. Pending imaging results. Pt resting on stretcher, respirations even & unlabored, NAD noted.

## 2023-03-06 ENCOUNTER — Encounter: Admit: 2023-03-06 | Payer: PRIVATE HEALTH INSURANCE | Primary: Obstetrics and Gynecology

## 2023-03-06 ENCOUNTER — Encounter: Admit: 2023-03-06 | Payer: MEDICAID | Attending: Obstetrics and Gynecology | Primary: Obstetrics and Gynecology

## 2023-03-06 DIAGNOSIS — R300 Dysuria: Secondary | ICD-10-CM

## 2023-03-06 DIAGNOSIS — Z30016 Encounter for initial prescription of transdermal patch hormonal contraceptive device: Secondary | ICD-10-CM

## 2023-03-06 DIAGNOSIS — R1030 Lower abdominal pain, unspecified: Secondary | ICD-10-CM

## 2023-03-06 MED ORDER — TWIRLA 120 MCG-30 MCG/24 HR TRANSDERMAL PATCH
120-3024 | MEDICATED_PATCH | TRANSDERMAL | 5 refills | Status: AC
Start: 2023-03-06 — End: ?

## 2023-03-11 NOTE — Progress Notes
 2/12/2025GYN FOLLOW-UPVIDEO TELEHEALTH VISIT: This clinician is part of the telehealth program and is conducting this visit in a currently approved location. For this visit the clinician and patient were present via interactive audio & video telecommunications system that permits real-time communications, via the Alternative HIPAA Compliant platform.Patient's use of the telehealth platform followed consent and acknowledges agreement to permit telehealth for this visit. State patient is located in: [place] clinician is appropriately licensed in the above state to provide care for this visit. Other individuals present during the telehealth encounter and their role/relation: noneBecause this visit was completed over video, a hands-on physical exam was not performed. Patient/parent or guardian understands and knows to call back if condition changes.HPI:  Mariah Ferguson is a 28 year old female who presents with concerns about birth control and recent severe pain.She recently visited the emergency room due to severe pain, described as similar to pain experienced before a previous emergency surgery. Despite administration of tramadol and morphine, the pain persisted. She was transported to the hospital by ambulance from work. During the visit, she underwent an ultrasound and a [place] scan. Initial concerns included a possible major blood infection, but all tests, including a urine sample, were negative for infection. She was discharged with instructions to return if the pain recurred.She continues to experience discomfort in the bladder and kidney area, describing it as a feeling of pressure. She has not regained full bladder control post-surgery, which she was informed would be a gradual process. 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 She discontinued its use after three days and is considering alternative birth control methods.She had COVID-19 within the last two weeks, during which she experienced an inability to urinate for 24 hours despite feeling dehydrated and drinking fluids. This was followed by a visit to the ER due to pain in the bladder area.Her last menstrual period was from January 26th to 29th, with the last two days being light spotting, which she attributes to her COVID-19 infection. She has not been sexually active since her last period.   Assessment/Plan:  Abdominal PainRecent ER visit for severe abdominal pain, similar to previous episode that required emergency surgery. No clear cause identified during ER visit. Pain persists, but at a lower intensity.-Order urine test at [facility] lab to rule out infection.Bladder DysfunctionReports of difficulty controlling bladder since surgery, with both incontinence and difficulty voiding. Recent episode of not being able to urinate for 24 hours during a COVID-19 infection.-Schedule appointment with Theora Gianotti for bladder health assessment.ContraceptionUnsuccessful trial of NuvaRing due to discomfort and difficulty with retention.-Discontinue NuvaRing.-Start birth control patch, with instructions for use.-Check blood pressure and patient satisfaction with method at next visit.COVID-19Recent infection, now resolved. Noted difficulty urinating during infection.-No further action needed at this time.Follow-up in 1 month to assess blood pressure and satisfaction with birth control patch.   Villa Herb, CNM2/12/2023 (note signed 2/17)

## 2023-04-17 ENCOUNTER — Encounter: Admit: 2023-04-17 | Payer: MEDICAID | Attending: Obstetrics and Gynecology | Primary: Obstetrics and Gynecology

## 2023-05-08 ENCOUNTER — Ambulatory Visit: Admit: 2023-05-08 | Payer: MEDICAID | Primary: Obstetrics and Gynecology

## 2023-05-08 ENCOUNTER — Emergency Department: Admit: 2023-05-08 | Payer: MEDICAID | Primary: Obstetrics and Gynecology

## 2023-05-08 ENCOUNTER — Inpatient Hospital Stay
Admit: 2023-05-08 | Discharge: 2023-05-08 | Payer: MEDICAID | Attending: Emergency Medicine | Primary: Obstetrics and Gynecology

## 2023-05-08 ENCOUNTER — Encounter: Admit: 2023-05-08 | Payer: PRIVATE HEALTH INSURANCE | Primary: Obstetrics and Gynecology

## 2023-05-08 DIAGNOSIS — G43009 Migraine without aura, not intractable, without status migrainosus: Secondary | ICD-10-CM

## 2023-05-08 DIAGNOSIS — R531 Weakness: Secondary | ICD-10-CM

## 2023-05-08 DIAGNOSIS — H532 Diplopia: Secondary | ICD-10-CM

## 2023-05-08 DIAGNOSIS — Z630 Problems in relationship with spouse or partner: Secondary | ICD-10-CM

## 2023-05-08 DIAGNOSIS — Z28311 Partially vaccinated for covid-19: Secondary | ICD-10-CM

## 2023-05-08 DIAGNOSIS — F419 Anxiety disorder, unspecified: Secondary | ICD-10-CM

## 2023-05-08 LAB — BASIC METABOLIC PANEL
BKR ANION GAP: 13 (ref 7–17)
BKR BLOOD UREA NITROGEN: 11 mg/dL (ref 6–20)
BKR BUN / CREAT RATIO: 15.7 (ref 8.0–23.0)
BKR CALCIUM: 9.5 mg/dL (ref 8.8–10.2)
BKR CHLORIDE: 102 mmol/L (ref 98–107)
BKR CO2: 20 mmol/L (ref 20–30)
BKR CREATININE DELTA: -0.1
BKR CREATININE: 0.7 mg/dL (ref 0.40–1.30)
BKR EGFR, CREATININE (CKD-EPI 2021): >60 mL/min/{1.73_m2} (ref >=60)
BKR GLUCOSE: 97 mg/dL (ref 70–100)
BKR POTASSIUM: 4.6 mmol/L (ref 3.3–5.3)
BKR SODIUM: 135 mmol/L — ABNORMAL LOW (ref 136–144)

## 2023-05-08 LAB — CBC WITH AUTO DIFFERENTIAL
BKR WAM ABSOLUTE IMMATURE GRANULOCYTES.: 0.03 x 1000/ÂµL (ref 0.00–0.30)
BKR WAM ABSOLUTE LYMPHOCYTE COUNT.: 2.71 x 1000/ÂµL (ref 0.60–3.70)
BKR WAM ABSOLUTE NRBC (2 DEC): 0 x 1000/ÂµL (ref 0.00–1.00)
BKR WAM ANC (ABSOLUTE NEUTROPHIL COUNT): 7.5 x 1000/ÂµL (ref 2.00–7.60)
BKR WAM BASOPHIL ABSOLUTE COUNT.: 0.04 x 1000/ÂµL (ref 0.00–1.00)
BKR WAM BASOPHILS: 0.4 % (ref 0.0–1.4)
BKR WAM EOSINOPHIL ABSOLUTE COUNT.: 0.03 x 1000/ÂµL (ref 0.00–1.00)
BKR WAM EOSINOPHILS: 0.3 % (ref 0.0–5.0)
BKR WAM HEMATOCRIT (2 DEC): 41.4 % (ref 35.00–45.00)
BKR WAM HEMOGLOBIN: 13.7 g/dL (ref 11.7–15.5)
BKR WAM IMMATURE GRANULOCYTES: 0.3 % (ref 0.0–1.0)
BKR WAM LYMPHOCYTES: 24.4 % (ref 17.0–50.0)
BKR WAM MCH (PG): 30.8 pg (ref 27.0–33.0)
BKR WAM MCHC: 33.1 g/dL (ref 31.0–36.0)
BKR WAM MCV: 93 fL (ref 80.0–100.0)
BKR WAM MONOCYTE ABSOLUTE COUNT.: 0.8 x 1000/ÂµL (ref 0.00–1.00)
BKR WAM MONOCYTES: 7.2 % (ref 4.0–12.0)
BKR WAM MPV: 9.9 fL (ref 8.0–12.0)
BKR WAM NEUTROPHILS: 67.4 % (ref 39.0–72.0)
BKR WAM NUCLEATED RED BLOOD CELLS: 0 % (ref 0.0–1.0)
BKR WAM PLATELETS: 178 x1000/ÂµL (ref 150–420)
BKR WAM RDW-CV: 12.2 % (ref 11.0–15.0)
BKR WAM RED BLOOD CELL COUNT.: 4.45 M/ÂµL (ref 4.00–6.00)
BKR WAM WHITE BLOOD CELL COUNT: 11.1 x1000/ÂµL — ABNORMAL HIGH (ref 4.0–11.0)

## 2023-05-08 LAB — PROTIME AND INR
BKR INR: 1.04 (ref 0.87–1.13)
BKR PROTHROMBIN TIME: 11.2 s (ref 9.5–12.1)

## 2023-05-08 LAB — HCG, QUANTITATIVE     (BH GH LMW YH): BKR HCG, QUANTITATIVE: <1 m[IU]/mL

## 2023-05-08 LAB — PARTIAL THROMBOPLASTIN TIME     (BH GH LMW Q YH): BKR PARTIAL THROMBOPLASTIN TIME: 24.4 s (ref 23.0–31.0)

## 2023-05-08 LAB — TROPONIN T HIGH SENSITIVITY, 0 HOUR BASELINE WITH REFLEX (BH GH LMW YH): BKR TROPONIN T HS 0 HOUR BASELINE: <6 ng/L

## 2023-05-08 LAB — MAGNESIUM: BKR MAGNESIUM: 1.9 mg/dL (ref 1.7–2.4)

## 2023-05-08 LAB — PHOSPHORUS     (BH GH L LMW YH): BKR PHOSPHORUS: 2.1 mg/dL — ABNORMAL LOW (ref 2.2–4.5)

## 2023-05-08 MED ORDER — IOHEXOL 350 MG IODINE/ML INTRAVENOUS SOLUTION
350 | Freq: Once | INTRAVENOUS | Status: CP | PRN
Start: 2023-05-08 — End: ?
  Administered 2023-05-08: 05:00:00 350 mL via INTRAVENOUS

## 2023-05-08 MED ORDER — DIPHENHYDRAMINE 50 MG/ML INJECTION (WRAPPED E-RX)
50 | Freq: Once | INTRAVENOUS | Status: CP
Start: 2023-05-08 — End: ?
  Administered 2023-05-08: 07:00:00 50 mL via INTRAVENOUS

## 2023-05-08 MED ORDER — LORAZEPAM 2 MG/ML INJECTION SOLUTION
2 | Status: DC
Start: 2023-05-08 — End: 2023-05-08

## 2023-05-08 MED ORDER — MAGNESIUM SULFATE 2 GRAM/50 ML (4 %) IN WATER INTRAVENOUS PIGGYBACK
2 | Freq: Once | INTRAVENOUS | Status: CP
Start: 2023-05-08 — End: ?
  Administered 2023-05-08: 07:00:00 2 mL/h via INTRAVENOUS

## 2023-05-08 MED ORDER — SODIUM CHLORIDE 0.9 % LARGE VOLUME SYRINGE FOR AUTOINJECTOR
Freq: Once | INTRAVENOUS | Status: CP | PRN
Start: 2023-05-08 — End: ?
  Administered 2023-05-08: 05:00:00 via INTRAVENOUS

## 2023-05-08 MED ORDER — SODIUM CHLORIDE 0.9 % IV BOLUS NEW BAG (DROPS CHARGE)
0.9 | Freq: Once | INTRAVENOUS | Status: CP
Start: 2023-05-08 — End: ?
  Administered 2023-05-08: 07:00:00 0.9 mL/h via INTRAVENOUS

## 2023-05-08 MED ORDER — LORAZEPAM 2 MG/ML INJECTION SOLUTION
2 | Freq: Once | INTRAVENOUS | Status: CP
Start: 2023-05-08 — End: ?
  Administered 2023-05-08: 06:00:00 2 mL via INTRAVENOUS

## 2023-05-08 MED ORDER — METOCLOPRAMIDE 5 MG/ML INJECTION SOLUTION
5 | Freq: Once | INTRAVENOUS | Status: CP
Start: 2023-05-08 — End: ?
  Administered 2023-05-08: 07:00:00 5 mL via INTRAVENOUS

## 2023-05-08 MED ORDER — POTASSIUM, SODIUM PHOSPHATES 280 MG-160 MG-250 MG ORAL POWDER PACKET
280-160-250 | Freq: Once | ORAL | Status: CP
Start: 2023-05-08 — End: ?
  Administered 2023-05-08: 07:00:00 280-160-250 mg via ORAL

## 2023-05-08 NOTE — ED Notes
 7:07 AM Report received. Pt here after a verbal argument with her boyfriend when she started hyperventilating with hand spasms. Received .5 of Ativan for anxiety. Was seen with eyes crossed and unable to ambulate, stroke code called. [place] & MRI negative, stroke code canceled. Per previous RN pt back to baseline, ambulating to the bathroom with steady gait. Alert and oriented x4. Pending re-eval. Chief Complaint Patient presents with  Anxiety   Reports after having a verbal argument with her boyfriend where the end result was her packing her belongings. EMS reports that the significant other reports she was hyperventaliting with hand spasms. Was abel to be coached through her breathing then decided she was going to take a nap on the drive here. Arrives sleepy, awakes to verbal stimuli. Calm and cooperative.  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 Rpt US  in 1 month [ ]  ordered   Trichomonal infection   06/2021 8:00 AMFluids complete. Pt lying on stretcher with eyes closed, respirations unlabored.9:27 AMDischarge order in, paperwork provided. Pt agreeable with discharge plan. PIV removed. Vitals obtained. Pt ambultory to waiting room with steady gait.

## 2023-05-08 NOTE — Plan of Care
 Was contacted directly by Dr. Ermalinda Hays regarding prior telestroke evaluation and recommended general neurology consultation for evaluation of patient to determine discharge disposition and further management.Discussed with attending: Dr. Eladio Graven, DO, MSVascular Neurology Fellow PGY-5Department of NeurologyYale [facility]

## 2023-05-08 NOTE — ED Provider Notes
 Chief Complaint Patient presents with  Anxiety   Reports after having a verbal argument with her boyfriend where the end result was her packing her belongings. EMS reports that the significant other reports she was hyperventaliting with hand spasms. Was abel to be coached through her breathing then decided she was going to take a nap on the drive here. Arrives sleepy, awakes to verbal stimuli. Calm and cooperative.  History of Present IllnessThe patient, with no known past medical history, presents with acute onset of anxiety, visual disturbances, and a possible panic attack. The patient reports seeing double and experiencing a headache while crying, but is unsure if the headache is still present. The patient also reports difficulty with muscle strength, particularly in the lower extremities, but it is unclear if this is a new or chronic issue. The patient denies taking any medication for anxiety.An acute or life threatening problem was considered during this evaluation  A decision regarding hospitalization was made during this visit  External data reviewed: Notes (OSH or non-ED)History from independent historian: EMS.  Physical ExamED Triage Vitals [05/08/23 0434]BP: 113/62Pulse: 82Pulse from  O2 sat: n/aResp: 18Temp: 98.2 ?F (36.8 ?C)Temp src: OralSpO2: 98 % BP 113/62  - Pulse 82  - Temp 98.2 ?F (36.8 ?C) (Oral)  - Resp 18  - SpO2 98% Physical ExamPhysical ExamNEUROLOGICAL: Cranial nerves show impaired extraocular movements and diplopia is present. Facial nerve function is normal. Upper extremity strength is normal, but lower extremity strength is impaired.Serial neurologic examinations are inconsistent but flaccid paralysis LLE ProceduresAttestation/Critical CareCritical care provided by attending: critical careCritical care time (minutes): 34.Critical care time was exclusive of separately billable procedures and teaching time.Critical care was necessary to treat or prevent imminent or life-threatening deterioration of the following conditions:  CNS failure or compromise.Critical care was time spent personally by me on the following activities: ordering and review of laboratory studies, ordering and review of radiographic studies, pulse oximetry, re-evaluation of patient's condition, discussions with consultants, discussions with primary provider, examination of patient, ordering and performing treatments and interventions, obtaining history from patient or surrogate and review of old charts.Comments as of 05/08/23 0909 Wed May 08, 2023 0711 [facility] after meds, neuro final recc's. Stroke alertFought with boyfriend, left leg paralysis and eye, lasted one hourMRI neg.  [SV] 0900 MRI neg, pt states symptoms significantly improved. Called Telestroke fellow on-call as pt was called as a stroke code upon arrival. Dr. Aleen Campi recommended consulting Neuro at [facility] if patient is still symptomatic. Otherwise no further recommendations at this time.  [SV]  Comments User Index[SV] Ardis Hughs, MD   Clinical Impressions as of 05/08/23 0909 Diplopia Feeling anxious Weakness of left lower extremity  ED DispositionDischargeAssessment & PlanDiplopiaDiplopia of unclear etiology, requires ocular or neurological evaluation.  Suspect functional given situation of origin was one of emotional stress but will obtain imaging and dw neurology. Will also rx for atypical migraine and anxiety Scherrie Bateman, MD04/16/25 1610 Scherrie Bateman, MD04/16/25 0646 Ardis Hughs, MD04/16/25 [phone]

## 2023-05-08 NOTE — ED Notes
 4:37 AM Chief Complaint Patient presents with  Anxiety   Reports after having a verbal argument with her boyfriend where the end result was her packing her belongings. EMS reports that the significant other reports she was hyperventaliting with hand spasms. Was abel to be coached through her breathing then decided she was going to take a nap on the drive here. Arrives sleepy, awakes to verbal stimuli. Calm and cooperative.  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 Rpt US  in 1 month [ ]  ordered   Trichomonal infection   06/2021 4:48 AM Stroke code called per MD

## 2023-05-08 NOTE — ED Notes
 [facility]-SrcSpiritual Care NoteAssessment:  Religion:BaptistPatient arrived to Ed with stroke symptoms. Chaplain was called at bedside.  Upon arrival patient was transported to [facility] and later cade was cancelled.  Chaplain remained available for spiritual support.  Intervention:  Referral Source: ProtocolResponding Chaplain: On-Call ChaplainConsulted With: NurseLanguage or Special Accommodation Rendered?: NoVisit and Intervention Type: Crisis CallCrisis Call Type: CodeCrisis Call Location: Emergency Department Spiritual care interventions provided: Relational or Interpersonal Interventions: : Companionship, Empathy, Hospitality, Spiritual Support/Presence, Introduction to [employer] Interventions: : Life Review and/or Story Listening Outcome: With the help of the chaplain, patient/loved one(s): Relational or Interpersonal Outcomes:: Established a Relationship with the Chaplain, Felt Greater Comfort and/or Support Plan: Permission Given by the Patient/Representative to [employer] their Outside [employer] or Faith Liason: NoFollow-Up Visit Needed: Burt Casco AchelusCasualPete Brand [facility], [facility]/SRC: [phone]: [phone]/16/2025 7:51 AM

## 2023-05-08 NOTE — ED Notes
 6:53 AM Ambulated to restroom without difficulty

## 2023-05-08 NOTE — Other
 [facility]	 Patient Name: Mariah HoodAge: 28 y.o.Sex: femaleMRN: [id number] Date of Birth: 1997/07/04Telestroke NoteStroke Details Initial Evaluation:Consult Requested (Phone call from [facility]): 05/08/2023 4:50 AMConsult request responded (Video initiated): 05/08/2023 5:00 AMVerbal Consent: Vaughan Browner seen well: 05/08/2023 4:00 AMSymptoms first discovered: 05/08/2023 4:00 AMSymptoms discovered by: PatientReferring hospital: [facility]Referring staff name: Dr. Unk Lightning was provided by: patientWas video used? YesDid you experience technical difficulty? NoHistory of Present Illness/Description of Event:9F with history of tubo-ovarian abscess, right ovarian cysts, PCOS, PID (12/2022), and chronic pelvic pain who presents from home after her boyfriend called EMS following an argument where she ws complaining of hand spasms and hyperventilation. A stroke code was activated as she was noted to have subjective diplopia and abnormal eye movements as well as bilateral LE weakness, worse on the L. Patient reports the diplopia is new and that her LE weakness is likely related to chronic pain which often is worse on the L side.  Clinical Evaluation:Vital signs:Blood pressure: 113/62NIHSS1a. Level of consciousness 0 - Alert 1b. Level of consciousness questions 1 - Answers one correctly 1c. Level of consciousness command 0 - Executes both correctly 2.   Best gaze  3.   Visual 0 - No visual loss 4.   Facial palsy 0 - Normal 5a. Motor left arm 0 - No drift 5b. Motor right arm 0 - No drift 6a. Motor left leg 2 - Some effort against gravity 6b. Motor right leg 2 - Some effort against gravity 7.   Limb ataxia 0 - Absent 8.   Sensory 1 - Mild to moderate loss 9.   Best language 0 - No aphasia 10. Dysarthria  0 - Normal articulation 11. Extinction & Inattention 0 - No neglect NIHSS Total Score  NIHSS Comments: Constantly moving her head Alternating esotropia at rest Full EOM horizontally Diminished sensation in the LLE (she reports this is chronic)She minimally moves either leg; however, with coaxing she can get both anti-gravity for a few secondsPast Medical History:Current antithrombotic meds:No antithrombotic medications.Neuroimaging:Modality: CTA and CTImage findings: no hemorrhageAny large-vessel occlusion: NoSymptomatic ICA Stenosis > 70%: NoPreliminary diagnosis: UncertainAssessment and Plan Assessment: 9F presenting with diplopia and bilateral LE weakness in the setting of an argument with her boyfriend. Neurological exam as detailed above. [place] head, CTA head/neck without structural etiology to explain symptoms. Overall, it is difficult to localize symptoms and overall lower concern for stroke and risks of thrombolytic therapy felt to > potential benefit. She will be taken for urgent MRI brain. Disposition Pt will remain at referring hospital.Recommendations As aboveThe stroke program recommends the use of validated order-sets to ensure standardized care when a patient is admitted with a suspected or confirmed stroke/TIA.  Please order one of the following order-sets based on what fits your patient?s needs: Neuro Patients Admitted to an ICUTIA/Ischemic stroke non-thrombolytic admissionFor an inpatient with existing orders, consider Ischemic stroke - supplementalParticipants at the Bedside:attending of record Role -: Assistance with historyThis clinician is part of the telehealth program and is conducting this visit in a currently approved location. For this visit all clinicians and the patient were present via interactive audio & video telecommunications system that permits real-time communications.Signed:Kaylyne Axton Sherron Monday, MD [phone]/16/20255:13 AM

## 2023-05-08 NOTE — Discharge Instructions
 Thank you for allowing us  to take care of you. We hope that you feel better soon. You came into the emergency room because you had weakness and vision changes that have now resolved. While you were here, we did a thorough exam and you had tests which were all negative.A copy of your tests is provided with your discharge papers.If you have any problems with your prescriptions, or any problems after discharge, there is a nurse available Monday through Friday from 7:00am-3:30pm at [phone] to help answer any questions. You can also call the Emergency Department directly at [phone].Please follow up with your primary care doctor on the next business day. If you need a primary care provider please try one of the following options: [facility] [phone]), [facility] [phone]), or [facility] [phone]).Sometimes when you leave the emergency department, your symptoms can change or get worse. If this happens, please come back to the emergency department. Additionally, if you develop fever not controlled by tylenol/ibuprofen, confusion or strange behavior, new/increased pain, difficulty breathing, uncontrollable vomiting, or any other new or concerning symptoms, please return to the emergency department immediately for further evaluation.  We are always open.

## 2023-05-20 ENCOUNTER — Encounter
Admit: 2023-05-20 | Payer: PRIVATE HEALTH INSURANCE | Attending: Obstetrics and Gynecology | Primary: Obstetrics and Gynecology

## 2023-05-28 ENCOUNTER — Encounter
Admit: 2023-05-28 | Payer: PRIVATE HEALTH INSURANCE | Attending: Obstetrics and Gynecology | Primary: Obstetrics and Gynecology

## 2023-05-28 ENCOUNTER — Ambulatory Visit
Admit: 2023-05-28 | Payer: PRIVATE HEALTH INSURANCE | Attending: Obstetrics and Gynecology | Primary: Obstetrics and Gynecology

## 2023-05-28 VITALS — BP 100/60 | Wt 92.0 lb

## 2023-05-28 DIAGNOSIS — N7011 Chronic salpingitis: Secondary | ICD-10-CM

## 2023-05-28 DIAGNOSIS — Z30016 Encounter for initial prescription of transdermal patch hormonal contraceptive device: Secondary | ICD-10-CM

## 2023-05-28 DIAGNOSIS — A599 Trichomoniasis, unspecified: Secondary | ICD-10-CM

## 2023-05-28 DIAGNOSIS — Z113 Encounter for screening for infections with a predominantly sexual mode of transmission: Secondary | ICD-10-CM

## 2023-05-28 DIAGNOSIS — R109 Unspecified abdominal pain: Secondary | ICD-10-CM

## 2023-05-28 DIAGNOSIS — R7303 Prediabetes: Secondary | ICD-10-CM

## 2023-05-28 DIAGNOSIS — Z1331 Encounter for screening for depression: Secondary | ICD-10-CM

## 2023-05-28 DIAGNOSIS — Z01419 Encounter for gynecological examination (general) (routine) without abnormal findings: Secondary | ICD-10-CM

## 2023-05-28 DIAGNOSIS — N730 Acute parametritis and pelvic cellulitis: Secondary | ICD-10-CM

## 2023-05-28 DIAGNOSIS — E282 Polycystic ovarian syndrome: Secondary | ICD-10-CM

## 2023-05-28 DIAGNOSIS — N83201 Unspecified ovarian cyst, right side: Secondary | ICD-10-CM

## 2023-05-28 NOTE — Progress Notes
 [facility]5/6/2025GYN Annual ExamVitals:  05/28/23 0824 BP: 100/60 Weight: 41.7 kg  Weight 92 lbs.Body mass index is 17.97 kg/m?.[id number]	Patient's last menstrual period was 05/09/2023 (exact date).Chief Concern:  Mariah Ferguson is a 28 y.o. year-old female here today for her yearly preventive care and exam. 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 Gyn Review of Systems: No breast, pelvic, urinary, or sexual health concerns today. Pelvic/abdominal pain and pain from ovarian cysts has resolved. Still gets intermittent UTI symptoms and urgency in the morning. Was referred to urology, but they said she needed pap first, which we will do today. Does not want contraception right now. Using condoms when sexually active, but not sexually active right now. Periods have been regular without birth control. History unchanged from previous visit.  Patient-entered Data:   No data to display     PERSONAL LIFEWork: 			Yes 			Partner/Family:  	Lives with roommateMenstrual Hx: 		05/09/2023	Sexually active? 	Yes 	Contraception: 	No Risk for STIs:		Wants testing HEALTH MAINTENANCE	Gardasil Vaccine:  	yes.		Hereditary Cancer Testing: not indicatedLast Pap: 		08/2021 NILMLast Mammo: 		n/aColonoscopy: 		n/aBone Density: 		n/aCholesterol: 146  LIFESTYLE                                                                                              Kegels			Yes Nutrition		Good Exercise		Yes Substances		No SAFETY       Seatbelt use		Yes Smoke/CO		Yes Firearms		No   Safe @home /work	Yes Patient Health Questionnaire-2/9: Screening Instrument for DepressionOver the past 2 weeks, how often have you been bothered by any of the following problems? Little interest or pleasure in doing things: Not at all Feeling down, depressed, irritable, or hopeless?: Not at allPHQ-2 Total Score: 0 PHQ-2 results reviewed by CNM: 0. No mood concerns. PHYSICAL EXAM:General: Appears well, well developed, well nourished and in no acute distressThyroid: Soft, smooth, symmetrical, and non-tenderHeart: Regular rate & rhythm, no murmurs auscultatedLungs: Clear to auscultation bilaterally, no wheezes, rales or rhonchi, symmetric air entryBreast Exam: Normal bilaterally, no palpable masses or discharge.  Non-tender, no adenopathy.Abdomen: Soft, non-tender, no masses appreciatedExternal Genitalia: Normal external female genitaliaNo lesionsNo erythema or rashVagina: Rugaeted, normal mucosa with normal leukorrhea, no lesionsKegel squeeze: 4Cervix: Normal structureNo dischargeNo lesionsNo cervical motion tendernessUterus: Non-tender, normal size, smooth contours			  Adnexa: Non-tenderNo palpable masses bilaterallyRectal: No external lesions, internal exam deferredPatient exam or treatment required medical chaperone.The sensitive parts of the examination were performed with chaperone present: [employer] Name, Role/Title: Thirza Fleet, Engineer, site.IMPRESSIONNormal GYN examNeg depression screeningSTD testing acceptedPap due 8/2026Urinary urgencyPLANSTD testing ordered. Pap collected today and cultures ordered. PCP panel ordered. Encouraged condoms if sexually active and calling if she wants to initiate birth control. RTO yearlySamantha Dazaria Macneill, CNM5/6/20258:55 AM Patient Active Problem List  Diagnosis   Encounter for initial prescription of transdermal patch hormonal contraceptive device    Patch 02/2023  PID (acute pelvic inflammatory disease)   Abdominal pain, unspecified abdominal location   Left ovarian cyst    02/2022 emergent ovarian cystectomy r/t concern for torsion  Pre-diabetes    12/2021 HgbA1C 5.9%Reviewed lifestyle changesRepeat 40m, ordered  Hydrosalpinx Left, inpt admission 06/2021  Right ovarian cyst    01/17/22 ED US : right adnexa demonstrates 5.9 x 5.2 x 4.8 cm multiloculated cystic structure that  may represent a combination of hemorrhagic ovarian cysts versus a hemorrhagic ovarian cyst and hematosalpinx.Rpt US  in 1 month ordered: done in ED 2/10 when presented with pain: Previously seen likely right hemorrhagic cyst is now significantly decreased in size, measuring 1.4 x 1.4 x 1.4 cm  PCOS (polycystic ovarian syndrome)    Multiple follicles seen on USMinastrin Rx'd 01/2022 ALLERGIES: She has No Known Allergies. PAST MEDICAL HISTORY: She  has a past medical history of Hydrosalpinx (01/17/2022), PCOS (polycystic ovarian syndrome) (01/17/2022), PID (acute pelvic inflammatory disease), Pre-diabetes (01/24/2022), Right ovarian cyst (01/17/2022), and Trichomonal infection. PAST SURGICAL HISTORY: She  has a past surgical history that includes Cyst Removal. PAST OBSTETRICAL HISTORY: OB History   Gravida 0  Para 0  Term 0  Preterm 0  AB 0  Living 0   SAB 0  IAB 0  Ectopic 0  Molar 0  Multiple 0  Live Births 0    FAMILY HISTORY: Her family history is not on file. SOCIAL HISTORY: She  reports that she has never smoked. She has never used smokeless tobacco. She reports that she does not currently use alcohol. She reports current drug use. Drug: Marijuana. MEDICATIONS: She currently has no medications in their medication list.

## 2023-06-09 ENCOUNTER — Encounter
Admit: 2023-06-09 | Payer: PRIVATE HEALTH INSURANCE | Attending: Obstetrics and Gynecology | Primary: Obstetrics and Gynecology

## 2023-06-28 ENCOUNTER — Encounter
Admit: 2023-06-28 | Payer: PRIVATE HEALTH INSURANCE | Attending: Obstetrics and Gynecology | Primary: Obstetrics and Gynecology

## 2023-07-02 ENCOUNTER — Telehealth
Admit: 2023-07-02 | Payer: PRIVATE HEALTH INSURANCE | Attending: Obstetrics and Gynecology | Primary: Obstetrics and Gynecology

## 2023-07-02 ENCOUNTER — Encounter
Admit: 2023-07-02 | Payer: PRIVATE HEALTH INSURANCE | Attending: Obstetrics and Gynecology | Primary: Obstetrics and Gynecology

## 2023-07-02 NOTE — Telephone Encounter
 No show warning letter mailed to patient

## 2023-08-14 ENCOUNTER — Encounter
Admit: 2023-08-14 | Payer: PRIVATE HEALTH INSURANCE | Attending: Obstetrics and Gynecology | Primary: Obstetrics and Gynecology

## 2023-09-07 ENCOUNTER — Inpatient Hospital Stay: Admit: 2023-09-07 | Discharge: 2023-09-08 | Payer: MEDICAID | Primary: Obstetrics and Gynecology

## 2023-09-07 MED ORDER — ONDANSETRON HCL (PF) 4 MG/2 ML INJECTION SOLUTION
4 | Freq: Once | INTRAVENOUS | Status: CP
Start: 2023-09-07 — End: ?
  Administered 2023-09-08: 4 mL via INTRAVENOUS

## 2023-09-07 MED ORDER — DEXTROSE 5 % AND 0.9 % SODIUM CHLORIDE INTRAVENOUS SOLUTION
INTRAVENOUS | Status: DC
Start: 2023-09-07 — End: 2023-09-08
  Administered 2023-09-08: 1000.000 mL/h via INTRAVENOUS

## 2023-09-08 LAB — CBC WITH AUTO DIFFERENTIAL
BKR WAM ABSOLUTE IMMATURE GRANULOCYTES.: 0.05 x 1000/ÂµL (ref 0.00–0.30)
BKR WAM ABSOLUTE LYMPHOCYTE COUNT.: 3.17 x 1000/ÂµL (ref 0.60–3.70)
BKR WAM ABSOLUTE NRBC: 0 x 1000/ÂµL (ref 0.00–1.00)
BKR WAM ANC (ABSOLUTE NEUTROPHIL COUNT): 8.7 x 1000/ÂµL — ABNORMAL HIGH (ref 2.00–7.60)
BKR WAM BASOPHIL ABSOLUTE COUNT.: 0.05 x 1000/ÂµL (ref 0.00–1.00)
BKR WAM BASOPHILS: 0.4 % (ref 0.0–1.4)
BKR WAM EOSINOPHIL ABSOLUTE COUNT.: 0.26 x 1000/ÂµL (ref 0.00–1.00)
BKR WAM EOSINOPHILS: 2 % (ref 0.0–5.0)
BKR WAM HEMATOCRIT: 36.3 % (ref 35.00–45.00)
BKR WAM HEMOGLOBIN: 12.5 g/dL (ref 11.7–15.5)
BKR WAM IMMATURE GRANULOCYTES: 0.4 % (ref 0.0–1.0)
BKR WAM LYMPHOCYTES: 23.8 % (ref 17.0–50.0)
BKR WAM MCH: 31.9 pg (ref 27.0–33.0)
BKR WAM MCHC: 34.4 g/dL (ref 31.0–36.0)
BKR WAM MCV: 92.6 fL (ref 80.0–100.0)
BKR WAM MONOCYTE ABSOLUTE COUNT.: 1.09 x 1000/ÂµL — ABNORMAL HIGH (ref 0.00–1.00)
BKR WAM MONOCYTES: 8.2 % (ref 4.0–12.0)
BKR WAM MPV: 9.4 fL (ref 8.0–12.0)
BKR WAM NEUTROPHILS: 65.2 % (ref 39.0–72.0)
BKR WAM NUCLEATED RED BLOOD CELLS: 0 % (ref 0.0–1.0)
BKR WAM PLATELETS: 219 x1000/ÂµL (ref 150–420)
BKR WAM RDW-CV: 11.9 % (ref 11.0–15.0)
BKR WAM RED BLOOD CELL COUNT.: 3.92 M/ÂµL — ABNORMAL LOW (ref 4.00–6.00)
BKR WAM WHITE BLOOD CELL COUNT: 13.3 x1000/ÂµL — ABNORMAL HIGH (ref 4.0–11.0)

## 2023-09-08 LAB — URINALYSIS WITH CULTURE REFLEX      (BH LMW YH)
BKR BILIRUBIN, UA: NEGATIVE
BKR BLOOD, UA: NEGATIVE
BKR KETONES, UA: NEGATIVE
BKR LEUKOCYTE ESTERASE, UA: NEGATIVE
BKR NITRITE, UA: NEGATIVE
BKR PH, UA: 6 (ref 5.5–7.5)
BKR PROTEIN, UA: NEGATIVE
BKR SPECIFIC GRAVITY, UA: 1.027 (ref 1.005–1.030)
BKR UROBILINOGEN, UA: <2 mg/dL (ref <=2.0)

## 2023-09-08 LAB — BASIC METABOLIC PANEL
BKR ANION GAP: 10 (ref 7–17)
BKR BLOOD UREA NITROGEN: 11 mg/dL (ref 6–20)
BKR BUN / CREAT RATIO: 22 (ref 8.0–23.0)
BKR CALCIUM: 9.4 mg/dL (ref 8.8–10.2)
BKR CHLORIDE: 105 mmol/L (ref 98–107)
BKR CO2: 22 mmol/L (ref 20–30)
BKR CREATININE DELTA: -0.3
BKR CREATININE: 0.5 mg/dL (ref 0.40–1.30)
BKR EGFR, CREATININE (CKD-EPI 2021): >60 mL/min/1.73m2 (ref >=60)
BKR GLUCOSE: 83 mg/dL (ref 70–100)
BKR POTASSIUM: 4.1 mmol/L (ref 3.3–5.3)
BKR SODIUM: 137 mmol/L (ref 136–144)

## 2023-09-08 LAB — UA REFLEX CULTURE

## 2023-09-08 MED ORDER — DOXYLAMINE SUCCINATE 25 MG TABLET
25 | ORAL_TABLET | Freq: Every evening | ORAL | 1 refills | Status: AC
Start: 2023-09-08 — End: ?

## 2023-09-08 MED ORDER — PYRIDOXINE (VITAMIN B6) 25 MG TABLET
25 | ORAL_TABLET | Freq: Four times a day (QID) | ORAL | 1 refills | Status: AC
Start: 2023-09-08 — End: 2023-09-09

## 2023-09-08 NOTE — ED Notes
 11:46 PM Rec'd pt. From WR, AO, NADN; c/o nausea and vomiting; G:1.Chief Complaint Patient presents with  Dehydration   [redacted] weeks pregnant, struggling to keep fluids down. Tried water , tea, tylenol  and other home remedies to stop feeling so nauseous. First appointment next week to confirm IUP.  Past Medical History[1]5:18 AM Pt. Address temporary change for medicaid taxi; [place] booked. 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 Rpt US  in 1 month [ ]  ordered   Trichomonal infection   06/2021

## 2023-09-08 NOTE — ED Provider Notes
 Chief Complaint Patient presents with  Dehydration   [redacted] weeks pregnant, struggling to keep fluids down. Tried water , tea, tylenol  and other home remedies to stop feeling so nauseous. First appointment next week to confirm IUP.  HPI/PE:Patient is a 28 year old G1P0 who presents to the emergency department's with persistent nausea and vomiting.  Patient reports LMP of June 19.  She reports she had a urine pregnancy test that was positive and has follow up with gyn this week for prenatal care.  Patient states that 2 days ago she started to feel very chilled and very warm and began to vomit.  Patient reports that for the last few days she has had persistent nausea and inability to tolerate p.o..  She states that she has been using home remedies such as tea and other substances to settle her stomach without any relief.  She reports taking prenatal vitamins as well as vitamins for nausea and states that that has not improved her symptoms.  Patient denies abdominal pain, abdominal cramping, pelvic cramping.  She denies any vaginal bleeding or discharge.  Patient reports her only symptom is nausea and vomiting.FIF:Mariah Ferguson is a 28 year old female with LMP of June 19 who presents with nausea and vomiting.  No vaginal bleeding or abdominal pain.#hyperemesis gravidarum.  We will check UA and U pregnancy here to confirm.  Patient has no symptoms concerning for ectopic given lack of abdominal pain or cramping, lack of vaginal bleeding.  We will treat symptomatically with D5 as well as antinausea medicines.  Patient has prenatal care appointment this week.  Patient has stable vital signs, was ambulating in the emergency department's without any difficulty.An acute or life threatening problem was considered during this evaluation  A decision regarding hospitalization was made during this visit    Physical ExamED Triage Vitals [09/07/23 2309]BP: 96/65Pulse: 81Pulse from  O2 sat: n/aResp: 18Temp: (!) 96.9 ?F (36.1 ?C)Temp src: TemporalSpO2: 99 % BP 101/67  - Pulse 89  - Temp (!) 96.9 ?F (36.1 ?C) (Temporal)  - Resp 16  - LMP 07/11/2023 (Approximate)  - SpO2 100% Physical ExamVitals and nursing note reviewed. Constitutional:     General: She is not in acute distress.   Appearance: Normal appearance. She is well-developed. HENT:    Head: Normocephalic and atraumatic.    Mouth/Throat:    Mouth: Mucous membranes are dry.    Pharynx: No oropharyngeal exudate. Eyes:    General: No scleral icterus.   Pupils: Pupils are equal, round, and reactive to light. Neck:    Trachea: No tracheal deviation. Cardiovascular:    Rate and Rhythm: Normal rate and regular rhythm.    Heart sounds: Normal heart sounds. No murmur heard.Pulmonary:    Effort: Pulmonary effort is normal. No respiratory distress.    Breath sounds: Normal breath sounds. No stridor. No wheezing. Abdominal:    General: There is no distension.    Palpations: Abdomen is soft.    Tenderness: There is no abdominal tenderness. There is no right CVA tenderness, left CVA tenderness, guarding or rebound. Musculoskeletal:       General: No swelling or tenderness. Normal range of motion.    Cervical back: Normal range of motion and neck supple. Skin:   General: Skin is warm.    Findings: No erythema or rash. Neurological:    Mental Status: She is alert and oriented to person, place, and time.    Cranial Nerves: No cranial nerve deficit.    Coordination: Coordination normal. Psychiatric:  Mood and Affect: Mood normal.       Behavior: Behavior normal.  ProceduresAttestation/Critical CareComments as of 09/08/23 0809 Sun Sep 08, 2023 0137 Signout received from outgoing provider. Pending UA. G1P0 [redacted] weeks gestation by LMP. Presenting with nausea, vomiting. Has been trying vit B6 at home. [AJ] 0430 UA is negative for UTI or asymptomatic bacteriuria. [AJ] 0500 Patient is feeling improvement in her symptoms.  She has been able to tolerate oral intake.  Prescribed vitamin B6 and Unisom  for outpatient management.  She has OB follow up later this week and I encouraged her to follow through with this appointment.  She voiced understanding and felt comfortable discharging. [AJ]  Comments User Index[AJ] Veatrice Knee, MD   Clinical Impressions as of 09/08/23 0809 [redacted] weeks gestation of pregnancy (HC CODE) Nausea and vomiting, unspecified vomiting type  ED DispositionDischarge  Wesley Lauraine HERO, MD08/16/25 2334 Wesley Lauraine HERO, MD08/17/25 0044 Veatrice Knee, MD08/17/25 [id number]

## 2023-09-08 NOTE — Discharge Instructions
 Your prescription was sent to [facility] at [facility]; please read full package insert for details.Please return to the emergency department immediately if you are unable to tolerate oral intake, you become dehydrated, or have any other concerns.You were examined and treated today in the emergency department (ED) on an emergency basis.  This visit is not a substitute for comprehensive and ongoing medical care.  In most cases, you must allow your primary physician evaluate you again.  Call your doctor today to advise them of your ED visit and arrange for outpatient follow-up.  After you leave the ED today, please follow instructions provided to you.  Return to the emergency department for any concerns.

## 2023-09-09 ENCOUNTER — Encounter
Admit: 2023-09-09 | Payer: PRIVATE HEALTH INSURANCE | Attending: Obstetrics and Gynecology | Primary: Obstetrics and Gynecology

## 2023-09-09 ENCOUNTER — Ambulatory Visit
Admit: 2023-09-09 | Payer: PRIVATE HEALTH INSURANCE | Attending: Obstetrics and Gynecology | Primary: Obstetrics and Gynecology

## 2023-09-09 ENCOUNTER — Telehealth
Admit: 2023-09-09 | Payer: PRIVATE HEALTH INSURANCE | Attending: Obstetrics and Gynecology | Primary: Obstetrics and Gynecology

## 2023-09-09 VITALS — BP 110/70 | Wt 90.0 lb

## 2023-09-09 DIAGNOSIS — Z3201 Encounter for pregnancy test, result positive: Secondary | ICD-10-CM

## 2023-09-09 DIAGNOSIS — Z3400 Encounter for supervision of normal first pregnancy, unspecified trimester: Secondary | ICD-10-CM

## 2023-09-09 DIAGNOSIS — N7011 Chronic salpingitis: Secondary | ICD-10-CM

## 2023-09-09 DIAGNOSIS — Z113 Encounter for screening for infections with a predominantly sexual mode of transmission: Secondary | ICD-10-CM

## 2023-09-09 DIAGNOSIS — R109 Unspecified abdominal pain: Secondary | ICD-10-CM

## 2023-09-09 DIAGNOSIS — E282 Polycystic ovarian syndrome: Secondary | ICD-10-CM

## 2023-09-09 DIAGNOSIS — N730 Acute parametritis and pelvic cellulitis: Secondary | ICD-10-CM

## 2023-09-09 DIAGNOSIS — Z3401 Encounter for supervision of normal first pregnancy, first trimester: Principal | ICD-10-CM

## 2023-09-09 DIAGNOSIS — O219 Vomiting of pregnancy, unspecified: Secondary | ICD-10-CM

## 2023-09-09 DIAGNOSIS — A599 Trichomoniasis, unspecified: Secondary | ICD-10-CM

## 2023-09-09 DIAGNOSIS — N911 Secondary amenorrhea: Secondary | ICD-10-CM

## 2023-09-09 DIAGNOSIS — N83201 Unspecified ovarian cyst, right side: Principal | ICD-10-CM

## 2023-09-09 DIAGNOSIS — R7303 Prediabetes: Secondary | ICD-10-CM

## 2023-09-09 MED ORDER — BONJESTA 20 MG-20 MG TABLET,IMMEDIATE AND DELAY RELEASE
20-20 | ORAL_TABLET | Freq: Two times a day (BID) | ORAL | 2 refills | Status: AC | PRN
Start: 2023-09-09 — End: ?

## 2023-09-09 NOTE — Telephone Encounter
 [name] dating ultra sound sent to [facility] today.[place], [place] 8/18/202510:27 AM

## 2023-09-10 LAB — C. TRACHOMATIS/N. GONORRHOEAE RNA BY TMA  (Q)
C. TRACHOMATIS RNA, TMA: NOT DETECTED
NEISSERIA GONORRHOEAE RNA, TMA: NOT DETECTED

## 2023-09-15 ENCOUNTER — Encounter
Admit: 2023-09-15 | Payer: PRIVATE HEALTH INSURANCE | Attending: Obstetrics and Gynecology | Primary: Obstetrics and Gynecology

## 2023-09-15 NOTE — Progress Notes
 [facility]8/18/2025Vitals:  09/09/23 0834 BP: 110/70 Weight: 40.8 kg  Weight 90 lbs. BP: (110)/(70) 110/70Body mass index is 17.58 kg/m?SABRAPatient's last menstrual period was 07/11/2023 (exact date). YesUrine Pregnancy Test: posChief Concern:  Mariah Ferguson is a 28 y.o. year-old G1P0000 here today for evaluation of delayed menses with suspected pregnancy. Mariah Ferguson is excited to be here, FOB [place] present at visit. 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 ED visit on 09/05/23 for n/v. Mariah Ferguson reports sleeping in that day under the blankets and AC was off. Woke up dehydrated, hot and w/ n/v. Believes the heat was the cause for n/v that day. 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 Boyfriend How are you feeling about being pregnant? Confident Did you have any challenges getting pregnant? No Are you ever worried about getting a sexually transmitted infection? No Are you doing anything to avoid sexually transmitted infections? Condoms Any history of sexually transmitted infections? None Abnormal Pap tests? If Yes, when: (None) Any cervical procedures related to abnormal Paps? (None) Breast problems: No Gynecological surgeries - If Yes, explain: (None) Any other gyn concerns? (None) Are you currently in a relationship that makes you uncomfortable or unsafe? No Any history of trauma? No Is there anything you would like to share with your midwife about this? No Is this something you would like to talk about, or just have us  be aware of? No Do you do Kegel exercises? (None) Do you exercise? (None) What type/how often/how long each time? (None) Seatbelt use: Do you wear a seatbelt? (None) Smoke/CO: Do you have working smoke and carbon monoxide detectors? (None) Firearms: Are there guns in your home? (None) How are they stored? (None) Is there any part of this visit you are anxious or worried about? (None) Is there anything we should know that will help us  take better care of you? (None) HEALTH MAINTENANCELast Pap: 		05/2023 HPV neg, gc/Craig negHereditary Cancer Testing: not indicatedEXPOSURE HISTORYVaricella risk: 		Toxoplasmosis risk: 	N/aHSV history: 		N/aCOVID vax status: 	Flu vaccine: 		Lead exposure risk: 	N/aSubstance use:	MarijuanaEdinburgh Postnatal Depression Scale:  In the Past 7 DaysI have been able to laugh and see the funny side of things.: As much as I always couldI have looked forward with enjoyment to things.: As much as I ever didI have blamed myself unnecessarily when things went wrong.: No, neverI have been anxious or worried for no good reason.: No, not at allI have felt scared or panicky for no good reason.: No, not at allThings have been getting on top of me.: No, I have been coping as well as everI have been so unhappy that I have had difficulty sleeping.: Not at allI have felt sad or miserable.: No, not at allI have been so unhappy that I have been crying.: No, neverThe thought of harming myself has occurred to me.: NeverEdinburgh Postnatal Depression Scale Total: 0 EPDS results reviewed by CNM: no mood concernsALLERGIES: She has no known allergies. PAST MEDICAL HISTORY: She  has a past medical history of Abdominal pain, unspecified abdominal location (06/16/2022), Hydrosalpinx (01/17/2022), PCOS (polycystic ovarian syndrome) (01/17/2022), PID (acute pelvic inflammatory disease), Pre-diabetes (01/24/2022), Right ovarian cyst (01/17/2022), and Trichomonal infection. PAST SURGICAL HISTORY: She  has a past surgical history that includes Cyst Removal. PAST OBSTETRICAL HISTORY: OB History   Gravida 1  Para 0  Term 0  Preterm 0  AB 0  Living 0   SAB 0  IAB 0  Ectopic 0  Molar 0  Multiple 0  Live Births 0    # Outcome Date GA Labor/2nd Weight Sex Type Anes PTL Lv A1 A5  1 Current                FAMILY HISTORY: Her family history includes Diabetes Type 2 in her mother; Hypertension in her mother; Ovarian cancer in her maternal great-grandmother; Polycystic ovary syndrome in her mother. SOCIAL HISTORY: She  reports that she has never smoked. She has never used smokeless tobacco. She reports that she does not currently use alcohol. She reports current drug use. Drug: Marijuana. MEDICATIONS: She has a current medication list which includes the following prescription(s): doxylamine  succinate (UNISOM , DOXYLAMINE ,) 25 mg tablet - Take 0.5 tablets (12.5 mg total) by mouth nightly for 30 doses and doxylamine -pyridoxine , vit B6, (BONJESTA ) 20-20 mg TbID - Take 1 tablet by mouth 2 (two) times daily as needed (nausea).   PROBLEM LIST: Patient Active Problem List Diagnosis  Hydrosalpinx  Right ovarian cyst  PCOS (polycystic ovarian syndrome)  Pre-diabetes  Left ovarian cyst  Supervision of normal first pregnancy, antepartum (HC CODE)   PHYSICAL EXAM:General: Appears well, well developed, well nourished and in no acute distressAbd: soft, NT. +FH with doptone Remainder of exam deferred, was completed at May 2025 gyn annual and Mariah Ferguson prefers thisIMPRESSIONSecondary amenorrhea, suspect IUP at 64w4dMarijuana useNausea in pregnancyHx of preDM A1cPLANOriented to [facility], MFM r/t, [facility], New OB packet given and reviewedCalling/Safety reviewedFood safety deferred, will discuss at next visit Recommended vaccinations: discuss this fallNOB labs plus TSH, NIPT and carrier screeningDating and anatomy US  orderedMed list reviewed, pt taking prenatal vitaminsDiclegis ordered for nausea management, discussed risks of Marijuana use in pregnancy and advised Rxd med use instead.Mariah Ferguson will f/u with her mom about OB hx. I'll send a MyChart message with questions she can ask her mom and she will write back there (sent 8/24).RTO Prenatal Review visit 4 wksVisit done together with Geno Mae, SNMLaura C Thula Stewart, CNM8/18/2025

## 2023-09-24 ENCOUNTER — Telehealth
Admit: 2023-09-24 | Payer: PRIVATE HEALTH INSURANCE | Attending: Obstetrics and Gynecology | Primary: Obstetrics and Gynecology

## 2023-09-24 NOTE — Telephone Encounter
 Carly from [facility] Riccio's office called to inform us  that Mariah Ferguson no showed her appointment for her dating scan today. It was confirmed yesterday and she didn't show today.

## 2023-10-03 ENCOUNTER — Encounter: Admit: 2023-10-03 | Payer: PRIVATE HEALTH INSURANCE | Primary: Obstetrics and Gynecology

## 2023-10-08 ENCOUNTER — Encounter
Admit: 2023-10-08 | Payer: PRIVATE HEALTH INSURANCE | Attending: Obstetrics and Gynecology | Primary: Obstetrics and Gynecology

## 2023-10-23 ENCOUNTER — Inpatient Hospital Stay: Admit: 2023-10-23 | Discharge: 2023-10-23 | Payer: MEDICAID | Primary: Obstetrics and Gynecology

## 2023-10-23 ENCOUNTER — Ambulatory Visit
Admit: 2023-10-23 | Payer: PRIVATE HEALTH INSURANCE | Attending: Obstetrics and Gynecology | Primary: Obstetrics and Gynecology

## 2023-10-23 VITALS — BP 100/60 | Wt 92.4 lb

## 2023-10-23 DIAGNOSIS — R300 Dysuria: Secondary | ICD-10-CM

## 2023-10-23 DIAGNOSIS — E282 Polycystic ovarian syndrome: Secondary | ICD-10-CM

## 2023-10-23 DIAGNOSIS — Z3401 Encounter for supervision of normal first pregnancy, first trimester: Secondary | ICD-10-CM

## 2023-10-23 DIAGNOSIS — Z3400 Encounter for supervision of normal first pregnancy, unspecified trimester: Principal | ICD-10-CM

## 2023-10-23 DIAGNOSIS — R7303 Prediabetes: Secondary | ICD-10-CM

## 2023-10-23 LAB — TREPONEMA PALLIDUM (SYPHILIS) ANTIBODY W/REFLEX
BKR TREPONEMA PALLIDUM ANTIBODY INITIAL RESULT: <0.1 {index}
BKR TREPONEMA PALLIDUM ANTIBODY TOTAL, SERUM: NONREACTIVE

## 2023-10-23 LAB — HEPATITIS B SURFACE ANTIGEN     (BH GH L LMW YH): BKR HEPATITIS B SURFACE ANTIGEN: NEGATIVE

## 2023-10-23 LAB — CBC WITH AUTO DIFFERENTIAL
BKR WAM ABSOLUTE IMMATURE GRANULOCYTES.: 0.08 x 1000/ÂµL (ref 0.00–0.30)
BKR WAM ABSOLUTE LYMPHOCYTE COUNT.: 2.52 x 1000/ÂµL (ref 0.60–3.70)
BKR WAM ABSOLUTE NRBC: 0 x 1000/ÂµL (ref 0.00–1.00)
BKR WAM ANC (ABSOLUTE NEUTROPHIL COUNT): 9.87 x 1000/ÂµL — ABNORMAL HIGH (ref 2.00–7.60)
BKR WAM BASOPHIL ABSOLUTE COUNT.: 0.03 x 1000/ÂµL (ref 0.00–1.00)
BKR WAM BASOPHILS: 0.2 % (ref 0.0–1.4)
BKR WAM EOSINOPHIL ABSOLUTE COUNT.: 0.07 x 1000/ÂµL (ref 0.00–1.00)
BKR WAM EOSINOPHILS: 0.5 % (ref 0.0–5.0)
BKR WAM HEMATOCRIT: 38.8 % (ref 35.00–45.00)
BKR WAM HEMOGLOBIN: 12.9 g/dL (ref 11.7–15.5)
BKR WAM IMMATURE GRANULOCYTES: 0.6 % (ref 0.0–1.0)
BKR WAM LYMPHOCYTES: 18.6 % (ref 17.0–50.0)
BKR WAM MCH: 31.7 pg (ref 27.0–33.0)
BKR WAM MCHC: 33.2 g/dL (ref 31.0–36.0)
BKR WAM MCV: 95.3 fL (ref 80.0–100.0)
BKR WAM MONOCYTE ABSOLUTE COUNT.: 0.97 x 1000/ÂµL (ref 0.00–1.00)
BKR WAM MONOCYTES: 7.2 % (ref 4.0–12.0)
BKR WAM MPV: 10.1 fL (ref 8.0–12.0)
BKR WAM NEUTROPHILS: 72.9 % — ABNORMAL HIGH (ref 39.0–72.0)
BKR WAM NUCLEATED RED BLOOD CELLS: 0 % (ref 0.0–1.0)
BKR WAM PLATELETS: 252 x1000/ÂµL (ref 150–420)
BKR WAM RDW-CV: 12.3 % (ref 11.0–15.0)
BKR WAM RED BLOOD CELL COUNT.: 4.07 M/ÂµL (ref 4.00–6.00)
BKR WAM WHITE BLOOD CELL COUNT: 13.5 x1000/ÂµL — ABNORMAL HIGH (ref 4.0–11.0)

## 2023-10-23 LAB — URINALYSIS-MACROSCOPIC W/REFLEX MICROSCOPIC
BKR BILIRUBIN, UA: NEGATIVE
BKR BLOOD, UA: NEGATIVE
BKR GLUCOSE, UA: NEGATIVE
BKR NITRITE, UA: NEGATIVE
BKR PH, UA: 6 (ref 5.5–7.5)
BKR SPECIFIC GRAVITY, UA: 1.026 (ref 1.005–1.030)
BKR UROBILINOGEN, UA: <2 mg/dL (ref <=2.0)

## 2023-10-23 LAB — URINE MICROSCOPIC     (BH GH LMW YH)
BKR RBC/HPF, UA (INSTRUMENT): 1 /HPF (ref 0–2)
BKR URINE SQUAMOUS EPITHELIAL CELLS, UA (INSTRUMENT): 3 /HPF (ref 0–5)
BKR WBC/HPF, UA (INSTRUMENT): 1 /HPF (ref 0–5)

## 2023-10-23 LAB — HIV-1/HIV-2 ANTIBODY/ANTIGEN SCREEN W/REFLEX     (BH GH LMW YH): BKR HIV 1 AND 2 ANTIBODY/HIV-1 ANTIGEN SCREEN: NEGATIVE

## 2023-10-23 LAB — HEPATITIS C AB WITH REFLEX TO HCV PCR: BKR HEPATITIS C ANTIBODY: NEGATIVE

## 2023-10-23 LAB — FERRITIN: BKR FERRITIN: 87 ng/mL (ref 13–150)

## 2023-10-23 LAB — RUBELLA ANTIBODY, IGG     (BH GH L LMW YH)
BKR RUBELLA ANTIBODY, IGG: POSITIVE
BKR RUBELLA IGG INITIAL RESULT: 4.31 {index}

## 2023-10-23 LAB — TSH W/REFLEX TO FT4     (BH GH LMW Q YH): BKR THYROID STIMULATING HORMONE: 1.99 u[IU]/mL

## 2023-10-23 NOTE — Progress Notes
 [redacted]w[redacted]d+FM. Feels ok. Dealing with constipation - strategies and safe medications reviewed. Reminded to go for new ob labs - will likely not find out fetal sex right away. Reviewed NUTRITION - hasn't been good for the past few weeks, which she thinks has been making her constipation worse. More processed foods, not much fast food. Plans to get back to meal prepping. EXERCISE - walks 52m 2-3x a day. Plans to continue. Reminded to schedule FAS. RTO 4 weeks.

## 2023-10-24 LAB — URINE CULTURE

## 2023-10-25 LAB — HEMOGLOBIN A1C
BKR ESTIMATED AVERAGE GLUCOSE: 103 mg/dL
BKR HEMOGLOBIN A1C: 5.2 % (ref 4.0–5.6)

## 2023-10-27 ENCOUNTER — Telehealth
Admit: 2023-10-27 | Payer: PRIVATE HEALTH INSURANCE | Attending: Obstetrics and Gynecology | Primary: Obstetrics and Gynecology

## 2023-10-27 DIAGNOSIS — Z3400 Encounter for supervision of normal first pregnancy, unspecified trimester: Principal | ICD-10-CM

## 2023-10-27 MED ORDER — NITROFURANTOIN MONOHYDRATE/MACROCRYSTALS 100 MG CAPSULE
100 | ORAL_CAPSULE | Freq: Two times a day (BID) | ORAL | 1 refills | 8.50000 days | Status: AC
Start: 2023-10-27 — End: ?

## 2023-10-27 NOTE — Telephone Encounter
 Call from [facility] through the service reporting bladder pressure, incomplete emptying, urinary frequency. Denies dysuria or hematuria. Feels like UTIs she has had in the past. She is [redacted] weeks EGA. NKDA. Will Rx'd Macrobid. Can't leave culture given Sunday, but advised calling within next few days if sx not resolved. Quadir Muns, CNM10/05/2023

## 2023-10-28 LAB — HEMOGLOBINOPATHY EVALUATION SCREENING
BKR HEMOGLOBIN A2 QUANTITATION: 2.5 % (ref 0.0–4.0)
BKR HGB A: 97.5 % (ref 94.0–100.0)

## 2023-10-29 ENCOUNTER — Encounter
Admit: 2023-10-29 | Payer: PRIVATE HEALTH INSURANCE | Attending: Obstetrics and Gynecology | Primary: Obstetrics and Gynecology

## 2023-10-29 DIAGNOSIS — Z3400 Encounter for supervision of normal first pregnancy, unspecified trimester: Principal | ICD-10-CM

## 2023-10-30 LAB — COUNSYL CFDNA: CHROM 13,18,21+MICRODELETIONS+SEX CHROM ANALYSIS(BH GH LMW YH)
15Q11.2 DELETION: NEGATIVE
1P36 DELETION SYNDROME: NEGATIVE
22Q11.2 DELETION SYNDROME: NEGATIVE
4P DELETION: NEGATIVE
5P DELETION: NEGATIVE
CHROMOSOME 13 ANEUPLOIDY: NEGATIVE
CHROMOSOME 18 ANEUPLOIDY: NEGATIVE
CHROMOSOME 21 ANEUPLOIDY: NEGATIVE

## 2023-11-01 ENCOUNTER — Encounter
Admit: 2023-11-01 | Payer: PRIVATE HEALTH INSURANCE | Attending: Obstetrics and Gynecology | Primary: Obstetrics and Gynecology

## 2023-11-03 LAB — COUNSYL RECESSIVE GENE:UNIVERSAL PANEL (LARGE)   (BH GH LMW YH)
11-BETA-HYDROXYLASE-DEFICIENT CONGENITAL ADRENAL HYPERPLASIA: NEGATIVE
21-HYDROXYLASE-DEFICIENT CONGENITAL ADRENAL HYPERPLASIA: NEGATIVE
6-PYRUVOYL-TETRAHYDROPTERIN SYNTHASE DEFICIENCY: NEGATIVE
ABCC8-RELATED HYPERINSULINISM: NEGATIVE
ADENOSINE DEAMINASE DEFICIENCY: NEGATIVE
ALPHA-MANNOSIDOSIS: NEGATIVE
ALPHA-SARCOGLYCANOPATHY: NEGATIVE
ALPHA-THALASSEMIA: NEGATIVE
ALSTROM SYNDROME: NEGATIVE
AMT-RELATED GLYCINE ENCEPHALOPATHY: NEGATIVE
ANDERMANN SYNDROME: NEGATIVE
ARGININEMIA: NEGATIVE
ARGININOSUCCINIC ACIDURIA: NEGATIVE
ARSACS: NEGATIVE
ASPARTYLGLYCOSAMINURIA: NEGATIVE
ATAXIA WITH VITAMIN E DEFICIENCY: NEGATIVE
ATAXIA-TELANGIECTASIA: NEGATIVE
ATP7A-RELATED DISORDERS: NEGATIVE
AUTOSOMAL RECESSIVE OSTEOPETROSIS TYPE 1: NEGATIVE
BARDET-BIEDL SYNDROME, BBS1-RELATED: NEGATIVE
BARDET-BIEDL SYNDROME, BBS10-RELATED: NEGATIVE
BARDET-BIEDL SYNDROME, BBS12-RELATED: NEGATIVE
BARDET-BIEDL SYNDROME, BBS2-RELATED: NEGATIVE
BETA-SARCOGLYCANOPATHY: NEGATIVE
BIOTINIDASE DEFICIENCY: NEGATIVE
BLOOM SYNDROME: NEGATIVE
CALPAINOPATHY: NEGATIVE
CANAVAN DISEASE: NEGATIVE
CARBAMOYLPHOSPHATE SYNTHETASE I DEFICIENCY: NEGATIVE
CARNITINE PALMITOYLTRANSFERASE IA DEFICIENCY: NEGATIVE
CARNITINE PALMITOYLTRANSFERASE II DEFICIENCY: NEGATIVE
CARTILAGE-HAIR HYPOPLASIA: NEGATIVE
CEREBROTENDINOUS XANTHOMATOSIS: NEGATIVE
CITRULLINEMIA TYPE 1: NEGATIVE
CLN3-RELATED NEURONAL CEROID LIPOFUSCINOSIS: NEGATIVE
CLN5-RELATED NEURONAL CEROID LIPOFUSCINOSIS: NEGATIVE
CLN6-RELATED NEURONAL CEROID LIPOFUSCINOSIS: NEGATIVE
COHEN SYNDROME: NEGATIVE
COL4A3-RELATED ALPORT SYNDROME: NEGATIVE
COL4A4-RELATED ALPORT SYNDROME: NEGATIVE
CONGENITAL DISORDER OF GLYCOSYLATION TYPE IA: NEGATIVE
CONGENITAL DISORDER OF GLYCOSYLATION TYPE IB: NEGATIVE
CONGENITAL DISORDER OF GLYCOSYLATION TYPE IC: NEGATIVE
CONGENITAL FINNISH NEPHROSIS: NEGATIVE
COSTEFF OPTIC ATROPHY SYNDROME: NEGATIVE
CYSTIC FIBROSIS: NEGATIVE
CYSTINOSIS: NEGATIVE
D-BIFUNCTIONAL PROTEIN DEFICIENCY: NEGATIVE
DELTA-SARCOGLYCANOPATHY: NEGATIVE
DYSFERLINOPATHY: NEGATIVE
DYSTROPHINOPATHY (INCLUDING DUCHENNE/BECKER MUSCULAR DYSTROPHY): NEGATIVE
ERCC6-RELATED DISORDERS: NEGATIVE
ERCC8-RELATED DISORDERS: NEGATIVE
EVC-RELATED ELLIS-VAN CREVELD SYNDROME: NEGATIVE
EVC2-RELATED ELLIS-VAN CREVELD SYNDROME: NEGATIVE
FABRY DISEASE: NEGATIVE
FAMILIAL DYSAUTONOMIA: NEGATIVE
FAMILIAL MEDITERRANEAN FEVER: NEGATIVE
FANCONI ANEMIA C: NEGATIVE
FANCONI ANEMIA COMPLEMENTATION GROUP A: NEGATIVE
FKRP-RELATED DISORDERS: NEGATIVE
FKTN-RELATED DISORDERS: NEGATIVE
FRAGILE X SYNDROME: NEGATIVE
GALACTOKINASE DEFICIENCY: NEGATIVE
GALACTOSEMIA: NEGATIVE
GAMMA-SARCOGLYCANOPATHY: NEGATIVE
GAUCHER DISEASE: NEGATIVE
GJB2-RELATED DFNB1 NONSYNDROMIC HEARING LOSS AND DEAFNES: NEGATIVE
GLB1-RELATED DISORDERS: NEGATIVE
GLDC-RELATED GLYCINE ENCEPHALOPATHY: NEGATIVE
GLUTARIC ACIDEMIA TYPE 1: NEGATIVE
GLYCOGEN STORAGE DISEASE TYPE IA: NEGATIVE
GLYCOGEN STORAGE DISEASE TYPE IB: NEGATIVE
GLYCOGEN STORAGE DISEASE TYPE III: NEGATIVE
GNPTAB-RELATED DISORDERS: NEGATIVE
GRACILE SYNDROME: NEGATIVE
HADHA-RELATED DISORDERS: NEGATIVE
HB BETA CHAIN-RELATED HEMOGLOBINOPATHY: NEGATIVE
HEREDITARY FRUCTOSE INTOLERANCE: NEGATIVE
HERLITZ JUNCTIONAL EPIDERMOLYSIS BULLOSA, LAMA3-RELATED: NEGATIVE
HERLITZ JUNCTIONAL EPIDERMOLYSIS BULLOSA, LAMB3-RELATED: NEGATIVE
HERLITZ JUNCTIONAL EPIDERMOLYSIS BULLOSA, LAMC2-RELATED: NEGATIVE
HEXOSAMINIDASE A DEFICIENCY: NEGATIVE
HMG-COA LYASE DEFICIENCY: NEGATIVE
HOLOCARBOXYLASE SYNTHETASE DEFICIENCY: NEGATIVE
HOMOCYSTINURIA CAUSED BY CYSTATHIONINE BETA-SYNTHASE DEFICIENCY: NEGATIVE
HYDROLETHALUS SYNDROME: NEGATIVE
HYPOPHOSPHATASIA, AUTOSOMAL RECESSIVE: NEGATIVE
INCLUSION BODY MYOPATHY 2: NEGATIVE
ISOVALERIC ACIDEMIA: NEGATIVE
JOUBERT SYNDROME 2: NEGATIVE
KCNJ11-RELATED FAMILIAL HYPERINSULINISM: NEGATIVE
KRABBE DISEASE: NEGATIVE
LAMA2-RELATED MUSCULAR DYSTROPHY: NEGATIVE
LEIGH SYNDROME, FRENCH-CANADIAN TYPE: NEGATIVE
LIPOAMIDE DEHYDROGENASE DEFICIENCY: NEGATIVE
LIPOID CONGENITAL ADRENAL HYPERPLASIA: NEGATIVE
LYSOSOMAL ACID LIPASE DEFICIENCY: NEGATIVE
MAPLE SYRUP URINE DISEASE TYPE 1B: NEGATIVE
MAPLE SYRUP URINE DISEASE TYPE IA: NEGATIVE
MAPLE SYRUP URINE DISEASE TYPE II: NEGATIVE
MEDIUM CHAIN ACYL-COA DEHYDROGENASE DEFICIENCY: NEGATIVE
MEGALENCEPHALIC LEUKOENCEPHALOPATHY WITH SUBCORTICAL CYSTS: NEGATIVE
METACHROMATIC LEUKODYSTROPHY: NEGATIVE
METHYLMALONIC ACIDEMIA, CBLA TYPE: NEGATIVE
METHYLMALONIC ACIDEMIA, CBLB TYPE: NEGATIVE
METHYLMALONIC ACIDURIA AND HOMOCYSTINURIA, CBLC TYPE: NEGATIVE
MKS1-RELATED DISORDERS: NEGATIVE
MUCOLIPIDOSIS III GAMMA: NEGATIVE
MUCOLIPIDOSIS TYPE IV: NEGATIVE
MUCOPOLYSACCHARIDOSIS TYPE I: NEGATIVE
MUCOPOLYSACCHARIDOSIS TYPE II: NEGATIVE
MUCOPOLYSACCHARIDOSIS TYPE IIIA: NEGATIVE
MUCOPOLYSACCHARIDOSIS TYPE IIIB: NEGATIVE
MUCOPOLYSACCHARIDOSIS TYPE IIIC: NEGATIVE
MUSCLE-EYE-BRAIN DISEASE: NEGATIVE
MUT-RELATED METHYLMALONIC ACIDEMIA: NEGATIVE
MYO7A-RELATED DISORDERS: NEGATIVE
NEB-RELATED NEMALINE MYOPATHY: NEGATIVE
NIEMANN-PICK DISEASE TYPE C2: NEGATIVE
NIEMANN-PICK DISEASE TYPE C: NEGATIVE
NIEMANN-PICK DISEASE, SMPD1-ASSOCIATED: NEGATIVE
NIJMEGEN BREAKAGE SYNDROME: NEGATIVE
NORTHERN EPILEPSY: NEGATIVE
ORNITHINE TRANSCARBAMYLASE DEFICIENCY: NEGATIVE
PCCA-RELATED PROPIONIC ACIDEMIA: NEGATIVE
PCCB-RELATED PROPIONIC ACIDEMIA: NEGATIVE
PCDH15-RELATED DISORDERS: NEGATIVE
PENDRED SYNDROME: NEGATIVE
PEROXISOME BIOGENESIS DISORDER TYPE 3: NEGATIVE
PEROXISOME BIOGENESIS DISORDER TYPE 4: NEGATIVE
PEROXISOME BIOGENESIS DISORDER TYPE 5: NEGATIVE
PEROXISOME BIOGENESIS DISORDER TYPE 6: NEGATIVE
PEX1-RELATED ZELLWEGER SYNDROME SPECTRUM: NEGATIVE
PHENYLALANINE HYDROXYLASE DEFICIENCY: NEGATIVE
PKHD1-RELATED AUTOSOMAL RECESSIVE POLYCYSTIC KIDNEY DISEASE: NEGATIVE
POLYGLANDULAR AUTOIMMUNE SYNDROME TYPE 1: NEGATIVE
POMPE DISEASE: NEGATIVE
PPT1-RELATED NEURONAL CEROID LIPOFUSCINOSIS: NEGATIVE
PRIMARY CARNITINE DEFICIENCY: NEGATIVE
PRIMARY HYPEROXALURIA TYPE 1: NEGATIVE
PRIMARY HYPEROXALURIA TYPE 2: NEGATIVE
PRIMARY HYPEROXALURIA TYPE 3: NEGATIVE
PROP1-RELATED COMBINED PITUITARY HORMONE DEFICIENCY: NEGATIVE
PYCNODYSOSTOSIS: NEGATIVE
PYRUVATE CARBOXYLASE DEFICIENCY: NEGATIVE
RHIZOMELIC CHONDRODYSPLASIA PUNCTATA TYPE 1: NEGATIVE
RTEL1-RELATED DISORDERS: NEGATIVE
SALLA DISEASE: NEGATIVE
SANDHOFF DISEASE: NEGATIVE
SEGAWA SYNDROME: NEGATIVE
SHORT CHAIN ACYL-COA DEHYDROGENASE DEFICIENCY: NEGATIVE
SJOGREN-LARSSON SYNDROME: NEGATIVE
SMITH-LEMLI-OPITZ SYNDROME: NEGATIVE
SPASTIC PARAPLEGIA TYPE 15: NEGATIVE
SPINAL MUSCULAR ATROPHY: NEGATIVE
SPONDYLOTHORACIC DYSOSTOSIS: NEGATIVE
STEROID-RESISTANT NEPHROTIC SYNDROME: NEGATIVE
SULFATE TRANSPORTER-RELATED OSTEOCHONDRODYSPLASIA: NEGATIVE
TGM1-RELATED AUTOSOMAL RECESSIVE CONGENITAL ICHTHYOSIS: NEGATIVE
TPP1-RELATED NEURONAL CEROID LIPOFUSCINOSIS: NEGATIVE
TYROSINEMIA TYPE I: NEGATIVE
TYROSINEMIA TYPE II: NEGATIVE
USH1C-RELATED DISORDERS: NEGATIVE
USH2A-RELATED DISORDERS: NEGATIVE
USHER SYNDROME TYPE 3: NEGATIVE
VERY LONG CHAIN ACYL-COA DEHYDROGENASE DEFICIENCY: NEGATIVE
WILSON DISEASE: NEGATIVE
X-LINKED ADRENOLEUKODYSTROPHY: NEGATIVE
X-LINKED ALPORT SYNDROME: NEGATIVE
X-LINKED CONGENITAL ADRENAL HYPOPLASIA: NEGATIVE
X-LINKED JUVENILE RETINOSCHISIS: NEGATIVE
X-LINKED MYOTUBULAR MYOPATHY: NEGATIVE
X-LINKED SEVERE COMBINED IMMUNODEFICIENCY: NEGATIVE
XERODERMA PIGMENTOSUM GROUP A: NEGATIVE
XERODERMA PIGMENTOSUM GROUP C: NEGATIVE

## 2023-11-07 ENCOUNTER — Encounter: Admit: 2023-11-07 | Payer: PRIVATE HEALTH INSURANCE | Primary: Obstetrics and Gynecology

## 2023-11-26 ENCOUNTER — Ambulatory Visit
Admit: 2023-11-26 | Payer: PRIVATE HEALTH INSURANCE | Attending: Obstetrics and Gynecology | Primary: Obstetrics and Gynecology

## 2023-11-26 ENCOUNTER — Encounter: Admit: 2023-11-26 | Payer: PRIVATE HEALTH INSURANCE | Primary: Obstetrics and Gynecology

## 2023-11-26 ENCOUNTER — Inpatient Hospital Stay: Admit: 2023-11-26 | Discharge: 2023-11-26 | Payer: MEDICAID | Primary: Obstetrics and Gynecology

## 2023-11-26 ENCOUNTER — Ambulatory Visit: Admit: 2023-11-26 | Payer: MEDICAID | Primary: Obstetrics and Gynecology

## 2023-11-26 VITALS — BP 102/60 | Wt 98.0 lb

## 2023-11-26 DIAGNOSIS — Z3400 Encounter for supervision of normal first pregnancy, unspecified trimester: Principal | ICD-10-CM

## 2023-11-26 DIAGNOSIS — Z3A00 Weeks of gestation of pregnancy not specified: Secondary | ICD-10-CM

## 2023-11-26 DIAGNOSIS — Z0000 Encounter for general adult medical examination without abnormal findings: Principal | ICD-10-CM

## 2023-11-26 LAB — URINALYSIS-MACROSCOPIC W/REFLEX MICROSCOPIC
BKR BILIRUBIN, UA: NEGATIVE
BKR BLOOD, UA: NEGATIVE
BKR GLUCOSE, UA: NEGATIVE
BKR KETONES, UA: NEGATIVE
BKR LEUKOCYTE ESTERASE, UA: NEGATIVE
BKR NITRITE, UA: NEGATIVE
BKR PH, UA: 6 (ref 5.5–7.5)
BKR SPECIFIC GRAVITY, UA: 1.018 (ref 1.005–1.030)
BKR UROBILINOGEN, UA: <2 mg/dL (ref <=2.0)

## 2023-11-26 LAB — URINE MICROSCOPIC     (BH GH LMW YH)
BKR RBC/HPF, UA (INSTRUMENT): <1 /HPF (ref 0–2)
BKR URINE SQUAMOUS EPITHELIAL CELLS, UA (INSTRUMENT): 5 /HPF (ref 0–5)
BKR WBC/HPF, UA (INSTRUMENT): 1 /HPF (ref 0–5)

## 2023-11-26 NOTE — Progress Notes [1]
 Mariah Ferguson is 28yo G1P0 at [redacted]w[redacted]d here for routine prenatal care with [place]. Feeling well overall, with ++FM. Called 10/5 with UTI sxs, rx'd prophylactic Macrobid. Took one dose of Macrobid and woke up nauseous, vomited x1 so did not take anymore. States symptoms improved that week, but does still have frequency. UA/UC obtained today.  20w folder givenRecommended CBE. Plans to take at [facility].  Requested gender in envelope for reveal this weekend. Provided.Anatomy scan booked for tomorrow (11/27/23). Flu: received.Covid: declinesBlood products: acceptsRTO in 4 weeks or sooner PRN.Patient cared for together with Lauris Lie, SNM. Camie Gerald, CNM11/04/2023 5:59 PM

## 2023-11-27 ENCOUNTER — Inpatient Hospital Stay: Admit: 2023-11-27 | Discharge: 2023-11-27 | Payer: PRIVATE HEALTH INSURANCE | Primary: Obstetrics and Gynecology

## 2023-11-27 ENCOUNTER — Encounter
Admit: 2023-11-27 | Payer: PRIVATE HEALTH INSURANCE | Attending: Obstetrics and Gynecology | Primary: Obstetrics and Gynecology

## 2023-11-27 DIAGNOSIS — Z3400 Encounter for supervision of normal first pregnancy, unspecified trimester: Principal | ICD-10-CM

## 2023-11-27 DIAGNOSIS — O0990 Supervision of high risk pregnancy, unspecified, unspecified trimester: Principal | ICD-10-CM

## 2023-11-27 LAB — URINE CULTURE

## 2023-11-27 MED ORDER — PROGESTERONE MICRONIZED 200 MG CAPSULE
200 | ORAL_CAPSULE | Freq: Every evening | VAGINAL | 1 refills | 90.00000 days | Status: AC
Start: 2023-11-27 — End: ?

## 2023-12-04 ENCOUNTER — Inpatient Hospital Stay: Admit: 2023-12-04 | Discharge: 2023-12-04 | Payer: PRIVATE HEALTH INSURANCE | Primary: Obstetrics and Gynecology

## 2023-12-04 ENCOUNTER — Encounter
Admit: 2023-12-04 | Payer: PRIVATE HEALTH INSURANCE | Attending: Obstetrics and Gynecology | Primary: Obstetrics and Gynecology

## 2023-12-04 DIAGNOSIS — Z3A20 20 weeks gestation of pregnancy: Secondary | ICD-10-CM

## 2023-12-04 DIAGNOSIS — Z3686 Encounter for antenatal screening for cervical length: Secondary | ICD-10-CM

## 2023-12-04 DIAGNOSIS — O0990 Supervision of high risk pregnancy, unspecified, unspecified trimester: Principal | ICD-10-CM

## 2023-12-04 DIAGNOSIS — O26872 Cervical shortening, second trimester: Secondary | ICD-10-CM

## 2023-12-04 DIAGNOSIS — O26879 Cervical shortening, unspecified trimester: Principal | ICD-10-CM

## 2023-12-18 ENCOUNTER — Ambulatory Visit: Admit: 2023-12-18 | Payer: MEDICAID | Primary: Obstetrics and Gynecology

## 2023-12-18 ENCOUNTER — Inpatient Hospital Stay
Admit: 2023-12-18 | Discharge: 2023-12-18 | Payer: PRIVATE HEALTH INSURANCE | Attending: Obstetrics and Gynecology | Primary: Obstetrics and Gynecology

## 2023-12-18 ENCOUNTER — Inpatient Hospital Stay: Admit: 2023-12-18 | Discharge: 2023-12-18 | Payer: PRIVATE HEALTH INSURANCE | Primary: Obstetrics and Gynecology

## 2023-12-18 ENCOUNTER — Encounter
Admit: 2023-12-18 | Payer: PRIVATE HEALTH INSURANCE | Attending: Obstetrics and Gynecology | Primary: Obstetrics and Gynecology

## 2023-12-18 ENCOUNTER — Encounter: Admit: 2023-12-18 | Payer: PRIVATE HEALTH INSURANCE | Attending: Anesthesiology | Primary: Obstetrics and Gynecology

## 2023-12-18 ENCOUNTER — Inpatient Hospital Stay: Admit: 2023-12-18 | Payer: PRIVATE HEALTH INSURANCE | Primary: Obstetrics and Gynecology

## 2023-12-18 DIAGNOSIS — D45 Polycythemia vera: Secondary | ICD-10-CM

## 2023-12-18 DIAGNOSIS — Z3689 Encounter for other specified antenatal screening: Secondary | ICD-10-CM

## 2023-12-18 DIAGNOSIS — O26879 Cervical shortening, unspecified trimester: Principal | ICD-10-CM

## 2023-12-18 DIAGNOSIS — O26872 Cervical shortening, second trimester: Secondary | ICD-10-CM

## 2023-12-18 DIAGNOSIS — O0990 Supervision of high risk pregnancy, unspecified, unspecified trimester: Principal | ICD-10-CM

## 2023-12-18 DIAGNOSIS — Z3A22 22 weeks gestation of pregnancy: Secondary | ICD-10-CM

## 2023-12-18 DIAGNOSIS — Z3686 Encounter for antenatal screening for cervical length: Secondary | ICD-10-CM

## 2023-12-18 LAB — CBC WITH AUTO DIFFERENTIAL
BKR WAM ABSOLUTE IMMATURE GRANULOCYTES.: 0.12 x 1000/ÂµL (ref 0.00–0.30)
BKR WAM ABSOLUTE LYMPHOCYTE COUNT.: 2.16 x 1000/ÂµL (ref 0.60–3.70)
BKR WAM ABSOLUTE NRBC: 0 x 1000/ÂµL (ref 0.00–1.00)
BKR WAM ANC (ABSOLUTE NEUTROPHIL COUNT): 10.65 x 1000/ÂµL — ABNORMAL HIGH (ref 2.00–7.60)
BKR WAM BASOPHIL ABSOLUTE COUNT.: 0.02 x 1000/ÂµL (ref 0.00–1.00)
BKR WAM BASOPHILS: 0.1 % (ref 0.0–1.4)
BKR WAM EOSINOPHIL ABSOLUTE COUNT.: 0.07 x 1000/ÂµL (ref 0.00–1.00)
BKR WAM EOSINOPHILS: 0.5 % (ref 0.0–5.0)
BKR WAM HEMATOCRIT: 35.9 % (ref 35.00–45.00)
BKR WAM HEMOGLOBIN: 12 g/dL (ref 11.7–15.5)
BKR WAM IMMATURE GRANULOCYTES: 0.9 % (ref 0.0–1.0)
BKR WAM LYMPHOCYTES: 15.5 % — ABNORMAL LOW (ref 17.0–50.0)
BKR WAM MCH: 31.1 pg (ref 27.0–33.0)
BKR WAM MCHC: 33.4 g/dL (ref 31.0–36.0)
BKR WAM MCV: 93 fL (ref 80.0–100.0)
BKR WAM MONOCYTE ABSOLUTE COUNT.: 0.93 x 1000/ÂµL (ref 0.00–1.00)
BKR WAM MONOCYTES: 6.7 % (ref 4.0–12.0)
BKR WAM MPV: 10.4 fL (ref 8.0–12.0)
BKR WAM NEUTROPHILS: 76.3 % — ABNORMAL HIGH (ref 39.0–72.0)
BKR WAM NUCLEATED RED BLOOD CELLS: 0 % (ref 0.0–1.0)
BKR WAM PLATELETS: 257 x1000/ÂµL (ref 150–420)
BKR WAM RDW-CV: 12.3 % (ref 11.0–15.0)
BKR WAM RED BLOOD CELL COUNT.: 3.86 M/ÂµL — ABNORMAL LOW (ref 4.00–6.00)
BKR WAM WHITE BLOOD CELL COUNT: 14 x1000/ÂµL — ABNORMAL HIGH (ref 4.0–11.0)

## 2023-12-18 MED ORDER — SODIUM CHLORIDE 0.9 % (FLUSH) INJECTION SYRINGE
0.9 | Freq: Three times a day (TID) | INTRAVENOUS | Status: DC
Start: 2023-12-18 — End: 2023-12-19

## 2023-12-18 MED ORDER — PRENATAL VIT,CALCIUM 27-FERROUS FUM 60 MG IRON-FOLIC ACID 1 MG TABLET
60 | Freq: Every day | ORAL | Status: DC
Start: 2023-12-18 — End: 2023-12-19

## 2023-12-18 MED ORDER — ONDANSETRON HCL (PF) 4 MG/2 ML INJECTION SOLUTION
4 | INTRAVENOUS | Status: DC | PRN
Start: 2023-12-18 — End: 2023-12-19

## 2023-12-18 MED ORDER — ACETAMINOPHEN 1,000 MG/100 ML (10 MG/ML) INTRAVENOUS SOLUTION
10 | INTRAVENOUS | Status: DC | PRN
Start: 2023-12-18 — End: 2023-12-18
  Administered 2023-12-18: 15:00:00 10 mg/mL via INTRAVENOUS

## 2023-12-18 MED ORDER — ONDANSETRON HCL (PF) 4 MG/2 ML INJECTION SOLUTION
4 | INTRAVENOUS | Status: DC | PRN
Start: 2023-12-18 — End: 2023-12-18
  Administered 2023-12-18: 15:00:00 4 via INTRAVENOUS

## 2023-12-18 MED ORDER — LACTATED RINGERS INTRAVENOUS SOLUTION
INTRAVENOUS | Status: DC | PRN
Start: 2023-12-18 — End: 2023-12-18
  Administered 2023-12-18 (×2): via INTRAVENOUS

## 2023-12-18 MED ORDER — FENTANYL (PF) 50 MCG/ML (WRAPPED ERX) INJECTION
50 | INTRATHECAL | Status: DC | PRN
Start: 2023-12-18 — End: 2023-12-18
  Administered 2023-12-18: 15:00:00 50 ug/mL via INTRATHECAL

## 2023-12-18 MED ORDER — LACTATED RINGERS IV BOLUS NEW BAG (DROPS CHARGE)
Freq: Once | INTRAVENOUS | Status: DC
Start: 2023-12-18 — End: 2023-12-19

## 2023-12-18 MED ORDER — BUPIVACAINE (PF) 0.75 % (7.5 MG/ML) IN 8.25 % DEXTROSE INJECTION
0.75 | INTRATHECAL | Status: DC | PRN
Start: 2023-12-18 — End: 2023-12-18
  Administered 2023-12-18: 15:00:00 0.75 % (7.5 mg/mL) via INTRATHECAL

## 2023-12-18 MED ORDER — SODIUM CHLORIDE 0.9 % (FLUSH) INJECTION SYRINGE
0.9 | INTRAVENOUS | Status: DC | PRN
Start: 2023-12-18 — End: 2023-12-19

## 2023-12-18 MED ORDER — FENTANYL (PF) 50 MCG/ML (WRAPPED ERX) INJECTION
50 | Status: CP
Start: 2023-12-18 — End: ?

## 2023-12-18 NOTE — Discharge Note [[id number]]
 Pt voided spontaneously >253ml, feels like her bladder is empty. Denies bleeding or LOF. Discharge instructions given. Ambulated off of unit with significant other. Has follow up appointment.
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
## 2023-12-18 NOTE — Anesthesia Procedure/Diagnosis Confirmation Note [[id number]]
Operative Diagnosis:Pre-op:   * No pre-op diagnosis entered * Patient Coded Diagnosis   Pre-op diagnosis: Short cervix affecting pregnancy  Post-op diagnosis: Short cervix affecting pregnancy  Patient Diagnosis   None    Post-op diagnosis:   * Short cervix affecting pregnancy [O26.879]Operative Procedure(s) :Procedure(s) (LRB):CERCLAGE, UTERINE CERVIX, NONOBSTETRICAL (N/A)Post-op Procedure & Diagnosis ConfirmationPost-op Diagnosis: Post-op Diagnosis confirmed (no changes)Post-op Procedure: Post-op Procedure confirmed (no changes)

## 2023-12-18 NOTE — Anesthesia Procedure Notes [28]
 Room and Bed: 486 OR1 , 486-OR1  Current Location: 486-OR1 Neuraxial Block:Date/Time: 12/18/2023 3:07 PM intrathecalPain Diagnosis/Location: Cerclage       Requesting Physician: Bufford Pon, MDPre-procedure Checklistpatient identified; surgical consent reviewd; pre-op evaluation performed; Universal Protocol/timeout performed; IV checked; monitors and equipment checked; informed consent obtained and options, plan, risks & benefits discussed with patientUniversal ProtocolUniversal protocol documented by nurse: noTime out unable to be performed due to emergent nature of case: NoTiming: the time out is initiated after the patient is positioned and prior to the beginning of the procedure: YesName of patient and medical record number or DOB (if MRN is unavailable) stated and checked with ID band or        previously confirmed MEDICAL RECORD NUMBERYesProceduralist states or confirms the procedure to be performed: YesThe procedural consent is used to verify the procedure to be performed: YesSite of procedure(s) (with laterally or level) is topically marked per policy and visible after draping: N/ARadiographic imaging is present or a laterality side/site band is present on patient and is accessible: N/AMedications addressed: YesBlood Addressed: YesImaging addressed: N/AImplants addressed: N/ASpecial equipment or other needs addressed: YesProceduralist discusses particular challenges or special considerations: YesPre-op Anti-coagulation Ldz:WnwzAjdzopwz Neurological Deficits:NonePatient's pre-procedure mental status:  AwakePrep: chloraprep and patient drapedNeedle A:  intrathecalLevel: L3-4Injection technique: single-shotNeedle: Sprotte and Pencan      Gauge: 24 G      Length: 3.5 inTechnique: Landmark technique and needle through needle - Number of attempts: 2Event(s): blood not aspirated, injection not painful, no injection resistance, cerebrospinal fluid and no paresthesia     - Cerebrospinal fluid action: Positive birefringence before injection of spinal medicationsPerformed By:     - Anesthesiologist: Mariah Maude Faden, MD, personally, with Attending Anesthesiologist and with CRNA     - CRNA: Mariah Ferguson, CRNAPatient's position for procedure: sittingLaterality: N/AOutcome:  successful blockAttempts:  block completePatient tolerated block procedure well?:  yes

## 2023-12-18 NOTE — Discharge Note [[id number]]
 Nursing Discharge Note:Pt seen in OB Triage for planned cerclage placement. Pt Taken to OR for spinal placement and then TWO cerclage sutures were placed. Pt returned to PACU for recovery. FHR spot check was 148. Pt now able to independently ambulate to triage bathroom, but has been unable to urinate yet. Will try again to urinate in one hour, discussed the possibility of needing a straight cath done if pt is unable to urinate, pt verbalized understanding and agrees with POC. Once pt is able to urinate, she can be discharged to home. Her partner is at the bedside and very supportive with her care & recovery.

## 2023-12-18 NOTE — Operative Note [[id number]]
 [facility]	Operative ReportPatient Data:  Patient Name: Mariah Ferguson Age: 28 y.o. DOB: 27-Oct-1995	 MRN: [id number]	 DATE OF PROCEDURE/SURGERY: 11/26/2025OPERATION: Transvaginal cerclageINDICATIONS: Ultrasound indicated cerclageSURGEONS: Surgeons and Role:   * Bufford Pon, MD - Primary   * Josetta Clarity, MD - FellowOR STAFF: Circulator: Dimeo, Connee SAUNDERS, RNScrub Person: Deboise, ShontaResident: Myrna Aleck Fellows, MD ANESTHESIA: SpinalPREOP DIAGNOSIS: [redacted]w[redacted]d week pregnancy with progressive shortening on ultrasound despite progesterone  PVPOSTOP DIAGNOSIS: SameESTIMATED BLOOD LOSS:  15ccURINE OUTPUT:  400ccFLUIDS REPLACED:  1200ccCOMPLICATIONS: NoneFINDINGS: - SVE with closed cervix and pinpoint cervical os- Uncomplicated anterior and posterior dissection- Shirodkar cerclage with TWO SUTURES, both knots at 12 o'clock- Excellent hemostasis at end of casePROCEDURE: After informed consent was obtained, the patient was taken to the Operating Room. A time out was performed and documented patient, procedure, site of procedure and operating room team. Then spinal anesthesia was induced without difficulty. She was then prepped and draped in the usual sterile fashion and placed in the dorsal lithotomy position. A surgical pause was performed to reconfirm procedure and patient. A weighted posterior vaginal speculum was inserted and a retractor was used on the anterior to expose the cervix. The ring forceps were applied to the anterior lip of the cervix. An incision was then made from 10 o'clock to 2 o'clock with Bovie cautery. The anterior surface of the cervix was then bluntly dissected from bladder using a peanut exposing approximately 3cm of cervix.  Attention was then turned to the posterior and an incision was made at the junction of the vaginal mucosa with cervix using Bovie from 4 o'clock to 8 o'clock. The posterior peritoneum was bluntly dissected from the surface of the cervix using a peanut exposing approximately 3cm of cervix. At this time, ring forceps were applied to the anterior and posterior lip of the cervix. A straight allis clamp was used to apply traction to the left lateral cervix.  A mersilene tape was then introduced at the tips of the allis clamp from posterior to anterior on the left hand side. This procedure was then repeated on the right using the other end of the mersilene tape. The tape was then adjusted on the anterior to be at the level of the internal os of the cervix before tightening. The tape was then tied on the anterior surface at the 12 o'clock position. A second mersilene tape was introduced in a similar fashion with attempt to place further back on posterior cervix. This tape was also tied on the anterior surface at the 12 o'clock position. At the conclusion of the case, there was no further bleeding noted. The patient tolerated the procedure well. Radiofrequency wanding was negative for retained sponge. Sponge, lap and needle counts were correct x2. Transabdominal US  demonstrated a FH at the end of the case. The patient was taken to the Recovery Room in stable condition.Attending: Dr. EspinalJulie Lonie Rummell, MDMaternal Fetal Medicine [facility]

## 2023-12-18 NOTE — Anesthesia Postprocedure Evaluation [25]
 Anesthesia Post-op NotePatient: Mariah HoodProcedure(s):  Procedure(s) (LRB):CERCLAGE, UTERINE CERVIX, NONOBSTETRICAL (N/A) Last Vitals:  I have reviewed the post-operative vital signs as noted in the Epic chart.POSTOP EVALUATION:      Patient Recovery Location:  PACU     Vital Signs Status:  Stable     Patient Participation:  Patient participated     Mental Status:  Awake     Respiratory Status:  Acceptable     Airway Patency:  Patent     Cardiovascular/Hydration Status:  Stable     Pain Management:  Satisfactory to patient     Nausea/Vomiting Status:  Satisfactory to patientThere were no known notable events for this encounter.

## 2023-12-18 NOTE — OB Triage [1020011]
 Obstetrics - Triage Note Subjective: Mariah Ferguson is a 28 y.o. G1P0000 @ [redacted]w[redacted]d EGA, EDD 04/16/2024, by Last Menstrual Period who presents to [facility] Triage for cerclage eval. Pregnancy c/b progressive cervical shortening despite vaginal progesterone .Feels in her normal state of health. Has some discharge since starting the progesterone  but no irritation. All concerns addressed. Feels open to anything, wants to do what is best for the baby. Obstetric Review of Systems:Leakage of fluid: NoVaginal Bleeding: NoneFetal Movement: PresentContractions: noneDating Summary  Working EDD: 04/16/2024 set by [name], STUDENT on 09/09/2023 based on Last Menstrual Period on 07/11/2023 (Exact Date) Based On EDD GA Diff User Date  Last Menstrual Period on 07/11/2023 (Exact Date) 04/16/2024 Working [name], STUDENT 09/09/2023    Ultrasound on 10/04/2023 04/17/2024 -1d [name], CNM 10/23/2023  GA:  [redacted]w[redacted]d  Past Medical History[1]Past Surgical History[2]Pregnancy history:17.58  Objective:  Temp:  [97.2 ?F (36.2 ?C)] 97.2 ?F (36.2 ?C)Pulse:  [96] 96Resp:  [14] 14BP: (111)/(57) 111/57Physical Exam:Physical ExamGen: NADAbd: soft, gravid, nontenderLE: no erythema/edemaCervical Exam: cervix slightly deviated to patient right, posterior. Visually and digitally closedPatient exam or treatment required medical chaperone.The sensitive parts of the examination were performed with chaperone present: Espinal, MinaUltrasound: see clinic ultrasoundReview of LabsCBC Lab Results Component Value Date  WBC 13.5 (H) 10/23/2023  HGB 12.9 10/23/2023  HCT 38.80 10/23/2023  HCT 43.0 05/08/2023  PLT 252 10/23/2023  Syph Lab Results Component Value Date  TPALLAB Non-Reactive 10/23/2023  Blood Type Lab Results Component Value Date  LABABO A 10/23/2023  RH POS 10/23/2023  Hep B Lab Results Component Value Date  HEPBSAG Negative 10/23/2023  Ab Scrn Lab Results Component Value Date  LABANTI NEG 10/23/2023  HIV Lab Results Component Value Date  HIV1X2 Negative 10/23/2023  GBS No results found for: RHENDA MINTA THAYER PHILL, SGRBPCR [place] Lab Results Component Value Date  LABCHLA NOT DETECTED 09/09/2023  Rub Lab Results Component Value Date  RUBELLAIGG Positive 10/23/2023  GC Lab Results Component Value Date  LABNGO NOT DETECTED 09/09/2023   Assessment & Plan: 28 y.o. G1P0000 @ [redacted]w[redacted]d EGA, presenting to OB Triage for cerclage eval. Pregnancy c/b progressive cervical shortening despite vaginal progesterone .Comfortable without evidence of infection, abruption, or preterm labor. Reviewed evidence of cervical insufficiency and the associated risks of preterm birth. Counseled on options for management including termination of pregnancy, expectant management with continued vaginal progesterone , and cervical cerclage placement. Reviewed risks and benefits of cerclage including PPROM, preterm labor, fetal/neonatal demise; and surgical risks of bleeding, infection, damage to surrounding structures. She elects for cervical cerclage placement. NPO since yesterday evening. Anesthesia and charge aware. Plan for EUA, cerclage with Dr. Bufford. Likely appropriate for discharge home after. Discussed with Dr. Bufford. __________________________[name], MD 11/26/202511:24 AM  [1] Past Medical History:Diagnosis Date  Abdominal pain, unspecified abdominal location 06/16/2022  Hydrosalpinx 01/17/2022  Left, inpt admission 06/2021  PCOS (polycystic ovarian syndrome) 01/17/2022  Multiple follicles seen on US   PID (acute pelvic inflammatory disease)   06/2021  Pre-diabetes 01/24/2022  12/2021 HgbA1C 5.9% Repeat 57m, ordered  Right ovarian cyst 01/17/2022  01/17/22 ED US : right adnexa demonstrates 5.9 x 5.2 x 4.8 cm multiloculated cystic structure that may represent a combination of hemorrhagic ovarian cysts versus a hemorrhagic ovarian cyst and hematosalpinx. Rpt US  in 1 month [ ]  ordered   Trichomonal infection   06/2021 [2] Past Surgical History:Procedure Laterality Date  CYST REMOVAL

## 2023-12-18 NOTE — PACU Transfer of Care [100004]
 Post Anesthesia Transfer of Care NotePatient: Mariah HoodProcedure(s) Performed: Procedure(s) (LRB):CERCLAGE, UTERINE CERVIX, NONOBSTETRICAL (N/A)Last Vitals: I have reviewed the post-operative vital signs during the handoff as noted in the Epic chart.POSTOP HANDOFF :      Patient Location:  PACU     Level of Consciousness:  Awake, alert and oriented     VS stable since last recorded intra-op set? Yes       Oxygen source: room airPatient co-morbidities, intra-operative course, intake & output and antibiotics as per Anesthesia record were discussed with the RN.

## 2023-12-30 ENCOUNTER — Encounter
Admit: 2023-12-30 | Payer: PRIVATE HEALTH INSURANCE | Attending: Obstetrics and Gynecology | Primary: Obstetrics and Gynecology

## 2023-12-30 DIAGNOSIS — Z3403 Encounter for supervision of normal first pregnancy, third trimester: Secondary | ICD-10-CM

## 2023-12-30 DIAGNOSIS — Z3400 Encounter for supervision of normal first pregnancy, unspecified trimester: Principal | ICD-10-CM

## 2023-12-31 ENCOUNTER — Ambulatory Visit: Admit: 2023-12-31 | Payer: MEDICAID | Attending: Obstetrics and Gynecology | Primary: Obstetrics and Gynecology

## 2023-12-31 VITALS — BP 100/58 | Wt 101.6 lb

## 2023-12-31 DIAGNOSIS — Z3483 Encounter for supervision of other normal pregnancy, third trimester: Principal | ICD-10-CM

## 2023-12-31 NOTE — Progress Notes [1]
 68w5dHere for RPV. Feeling well with ++FM. Shirodkar cerclage in place from 12/18/23.  Has f/u US  booked 01/02/24. No concerns re cerclage. No bleeding, no cramping. Feels vaginal pressure she was feeling has improved. Shares WIC recommended starting Ensure to help with weight gain. We reviewed her daily food intake and made suggestions. She will contact WIC to see if they had a specific suggestion for supplements and send [facility] to let me know. 28wk labs ordered. RTO 4wks, sooner prn. Calling guidelines reviewed.

## 2024-01-02 ENCOUNTER — Ambulatory Visit: Admit: 2024-01-02 | Payer: MEDICAID | Attending: Obstetrics and Gynecology | Primary: Obstetrics and Gynecology

## 2024-01-02 ENCOUNTER — Ambulatory Visit: Admit: 2024-01-02 | Payer: MEDICAID | Attending: MS" | Primary: Obstetrics and Gynecology

## 2024-01-02 ENCOUNTER — Inpatient Hospital Stay: Admit: 2024-01-02 | Discharge: 2024-01-02 | Payer: PRIVATE HEALTH INSURANCE | Primary: Obstetrics and Gynecology

## 2024-01-02 ENCOUNTER — Encounter
Admit: 2024-01-02 | Payer: PRIVATE HEALTH INSURANCE | Attending: Obstetrics and Gynecology | Primary: Obstetrics and Gynecology

## 2024-01-02 ENCOUNTER — Encounter
Admit: 2024-01-02 | Payer: PRIVATE HEALTH INSURANCE | Attending: Student in an Organized Health Care Education/Training Program | Primary: Obstetrics and Gynecology

## 2024-01-02 DIAGNOSIS — O365990 Maternal care for other known or suspected poor fetal growth, unspecified trimester, not applicable or unspecified: Secondary | ICD-10-CM

## 2024-01-02 DIAGNOSIS — O26842 Uterine size-date discrepancy, second trimester: Principal | ICD-10-CM

## 2024-01-02 DIAGNOSIS — Z315 Encounter for genetic counseling: Secondary | ICD-10-CM

## 2024-01-02 DIAGNOSIS — O3510X0 Maternal care for (suspected) chromosomal abnormality in fetus, unspecified, not applicable or unspecified: Secondary | ICD-10-CM

## 2024-01-02 DIAGNOSIS — O0990 Supervision of high risk pregnancy, unspecified, unspecified trimester: Principal | ICD-10-CM

## 2024-01-02 DIAGNOSIS — O365930 Maternal care for other known or suspected poor fetal growth, third trimester, not applicable or unspecified: Secondary | ICD-10-CM

## 2024-01-02 DIAGNOSIS — Z3483 Encounter for supervision of other normal pregnancy, third trimester: Secondary | ICD-10-CM

## 2024-01-02 LAB — CBC WITH AUTO DIFFERENTIAL
BKR WAM ABSOLUTE IMMATURE GRANULOCYTES.: 0.14 x 1000/ÂµL (ref 0.00–0.30)
BKR WAM ABSOLUTE LYMPHOCYTE COUNT.: 2.45 x 1000/ÂµL (ref 0.60–3.70)
BKR WAM ABSOLUTE NRBC: 0 x 1000/ÂµL (ref 0.00–1.00)
BKR WAM ANC (ABSOLUTE NEUTROPHIL COUNT): 11.88 x 1000/ÂµL — ABNORMAL HIGH (ref 2.00–7.60)
BKR WAM BASOPHIL ABSOLUTE COUNT.: 0.03 x 1000/ÂµL (ref 0.00–1.00)
BKR WAM BASOPHILS: 0.2 % (ref 0.0–1.4)
BKR WAM EOSINOPHIL ABSOLUTE COUNT.: 0.11 x 1000/ÂµL (ref 0.00–1.00)
BKR WAM EOSINOPHILS: 0.7 % (ref 0.0–5.0)
BKR WAM HEMATOCRIT: 35.9 % (ref 35.00–45.00)
BKR WAM HEMOGLOBIN: 12.3 g/dL (ref 11.7–15.5)
BKR WAM IMMATURE GRANULOCYTES: 0.9 % (ref 0.0–1.0)
BKR WAM LYMPHOCYTES: 15.6 % — ABNORMAL LOW (ref 17.0–50.0)
BKR WAM MCH: 32.9 pg (ref 27.0–33.0)
BKR WAM MCHC: 34.3 g/dL (ref 31.0–36.0)
BKR WAM MCV: 96 fL (ref 80.0–100.0)
BKR WAM MONOCYTE ABSOLUTE COUNT.: 1.06 x 1000/ÂµL — ABNORMAL HIGH (ref 0.00–1.00)
BKR WAM MONOCYTES: 6.8 % (ref 4.0–12.0)
BKR WAM MPV: 10.4 fL (ref 8.0–12.0)
BKR WAM NEUTROPHILS: 75.8 % — ABNORMAL HIGH (ref 39.0–72.0)
BKR WAM NUCLEATED RED BLOOD CELLS: 0 % (ref 0.0–1.0)
BKR WAM PLATELETS: 290 x1000/ÂµL (ref 150–420)
BKR WAM RDW-CV: 12.5 % (ref 11.0–15.0)
BKR WAM RED BLOOD CELL COUNT.: 3.74 M/ÂµL — ABNORMAL LOW (ref 4.00–6.00)
BKR WAM WHITE BLOOD CELL COUNT: 15.7 x1000/ÂµL — ABNORMAL HIGH (ref 4.0–11.0)

## 2024-01-02 LAB — TREPONEMA PALLIDUM (SYPHILIS) ANTIBODY W/REFLEX
BKR TREPONEMA PALLIDUM ANTIBODY INITIAL RESULT: <0.1 {index}
BKR TREPONEMA PALLIDUM ANTIBODY TOTAL, SERUM: NONREACTIVE

## 2024-01-02 LAB — HIV-1/HIV-2 ANTIBODY/ANTIGEN SCREEN W/REFLEX     (BH GH LMW YH): BKR HIV 1 AND 2 ANTIBODY/HIV-1 ANTIGEN SCREEN: NEGATIVE

## 2024-01-03 LAB — CYTOMEGALOVIRUS BY PCR     (BH GH LMW YH): BKR CMV BY PCR: NOT DETECTED

## 2024-01-03 NOTE — Progress Notes [1]
 Appointment Date: 12/11/2025Genetic Counselor: Mliss Hering, [facility] Referring Provider: Elida Moss, MDObstetric Provider: Allean Critchley, CNMPatient: Mariah HoodPatient DOB: September 29, 1997Patient Ethnicity: African-American			Partner: Mariah Ferguson DOB: 3/1/1996Partner Ethnicity: African-American, Native-American, and French ([place])Indication for Reproductive Genetic Counseling:Mariah Ferguson is currently 25+ weeks pregnant. As per verbal report only, a fetal ultrasound performed at [facility] earlier today revealed early onset growth restriction. The patient is considering having an amniocentesis with diagnostic testing.Patient's Medical History:Significant for the following: polycystic ovarian syndrome Of note, Mariah Ferguson reports that she is 5'0, her maternal half-sister is 64'0, her brother and father are/were ~5'8'-5'9, and her mother is 38'2. Mariah Ferguson has a very petite build.  Partner's Medical History:Non-contributoryMr. Ferguson informed me that he is 5'7.Pregnancy Information:G1  P0LMP: 6/19/25Estimated Date of Delivery: 3/26/26The current pregnancy was conceived spontaneously. [name] had cell-free DNA (cfDNA) screening for trisomy 19, trisomy 43, trisomy 2, and a microdeletion panel, with negative results. Since estimated date of conception:Bleeding/Spotting? NoMedications? NoSmoking? NoAlcohol? NoDrugs? NoDiagnostic X-rays? NoInfection? NoFamily history was obtained: Please see scanned pedigree (unless restricted in order to protect confidentiality).The history is significant qnm:Xwntw genetic disorders? NoIntellectual disabilities? NoCongenital anomalies? NoRecurrent miscarriages or stillbirths? Yes  Mariah Ferguson informed me that her mother had two successful pregnancies and one miscarriage with Mariah Ferguson's father and one successful pregnancy, two miscarriages, and one stillborn son with a different partner. Mariah Ferguson father had two successful pregnancies with a different partner. Mariah Ferguson recalls hearing that her mother's pregnancy losses were attributed to preeclampsia, but is uncertain of this information. No other family history of recurrent pregnancy loss was reported.Consanguinity? NoOther? Yes  Mariah Ferguson informed me that he had a son from a previous relationship who died at less than 63 months of age from accidental  asphyxiation. Although accidental asphyxiation can occur due to non-genetic factors, it may also be associated with an underlying genetic condition. Given the limited information available, it is not possible to provide targeted genetic counseling regarding the etiology and recurrence risk of Mariah Ferguson's son's condition. Mariah Ferguson informed me that a maternal half-sister died in the neonatal period of unknown cause.Mariah Ferguson father died at age 61 from sudden low blood pressure of unknown cause.Carrier screening has been performed in the past with the following results:Test Patient Neg/Normal Patient Carrier Partner Neg/Normal Partner Carrier Documentation Provided Cystic fibrosis [x]  []  []  []  [x]  Fragile X syndrome [x]  []  []  []  [x]  Spinal muscular atrophy (SMA) [x]  []  []  []  [x]  [place] disease(DNA analysis) [x]  []  []  []  [x]  [place] disease (enzyme analysis) []  []  []  []  []  MCV/MCH [x]  []  []  []  [x]  Quantitative hemoglobin electrophoresis [x]  []  []  []  [x]  Expanded pan-ethnic carrier screening (Myriad 176 gene panel) [x]  []  []  []  [x]  Other:  []  []  []  []  []  Discussion:We discussed the following regarding the finding of fetal growth restriction (FGR) and/or lagging measurements on ultrasound.Fetal growth restriction (FGR) is broadly defined as an estimated fetal weight (EFW) or abdominal circumference (AC) <10th percentile for gestational age. Severe FGR is generally defined as an EFW or AC <3rd percentile for gestational age; the presence of fetal umbilical artery (UA) Doppler abnormalities also suggests that FGR is severe. However, sonographic criteria for diagnosis of FGR vary.The [employer] of Obstetricians and Gynecologists (ACOG) suggests genetic counseling and offering diagnostic testing for patients with a diagnosis of FGR before 32 weeks or FGR in combination with polyhydramnios or fetal malformation.A fetus that is measuring smaller than gestational age could be due to many genetic /and or environmental factors, which  may be fetal, placental, and/or maternal in origin.Some fetuses may be constitutionally small due to inherited factors (such as family history of short stature), but not a specific genetic condition that is expected to cause other health concerns.Given Ms. Tomich's short stature and petite build, it is possible that the fetus is constitutionally small and does not have a genetic syndrome.Some fetuses may have restricted growth caused by a maternal health condition, pregnancy exposures, placental issues (ischemic disease, CPM, or other placental abnormalities), or infection (ex.CMV).Pregnancy conceived following ART/IVF have higher rates of babies that are small for gestational age. However, up to 20% of FGR cases (particularly <32 weeks) are associated with congenital anomalies and/or chromosomal/genetic conditions.We discussed the option of diagnostic testing via amniocentesis.Chromosome analysisMicroarray analysisSMFM recommends microarray testing specifically be offered when FGR is detected (SMFM Consult Series #52: Diagnosis and Management of Fetal Growth Restriction, 2020).In a meta-analysis to estimate the incremental yield of microarray analysis over karyotyping in FGR, microarray had a 4 percent (95% CI 1-6) incremental yield in nonanomalous FGR and a 10 percent (95% CI 6-14) incremental yield in FGR with associated anomalies [EFPI:[id number]].The option of pursuing targeted whole exome sequencing (WES) was also given. I explained that the yield of such testing in the setting of non-anomalous FGR is still being established. However, a recent study revealed that WES revealed pathogenic/likely pathogenic variants in 11.9% of fetuses with apparently isolated FGR and normal microarray results; fetuses with FGR specific to short long bones had a higher detection rate of pathogenic/likely pathogenic variants (33.3%) than those with FGR and proportionately small BPD, HC, AC, and FL (3.1%) EFPI:[id number].The patient was informed that prenatal microarray and/or exome sequencing may reveal variants of uncertain clinical significance (VUS) and/or secondary findings (abnormalities in genes that are not associated with the indication for testing). The option of requesting that the cytogenetic lab not disclose VUS detected via microarray was given to the patient.CMV testing can also be performed on an amniocentesis specimen.The nature, benefits, limitations, and risks of the following were explained:comprehensive ultrasound, cfDNA screening for aneuploidy, and amniocentesis. The patient was informed that genetic testing may reveal normal findings.  Although this does not eliminate the possibility that the fetus has a genetic condition, it significantly reduces this chance.There is a chance that the specimen obtained via amniocentesis may be insufficient for testing and/or may include maternal DNA, which may interfere with accurate interpretation of fetal genetic status.  If so, an additional prenatal specimen would be required in order to complete desired testing.I explained that mid-trimester amniocentesis is conservatively associated with a 1/400 risk for miscarriage.  If an amniocentesis is performed after [redacted] weeks gestation, there is a risk for premature rupture of membranes and preterm delivery, both of which are associated with maternal and fetal risks. Given the patient's advanced gestational age, an amniocentesis must be pre-approved by a Maternal Fetal Medicine physician prior to the procedure being scheduled.Plan: The patient is planning to have an amniocentesis at [facility] today.Comprehensive cytogenetic analysis, extended gene analysis/WES and microarray testing were ordered.The patient was given the option of signing a financial waiver for prenatal microarray/targeted gene analysis in order to expedite testing.  She was informed that without the signed waiver or insurance pre-authorization, testing will not proceed. She elected not to sign the waiver(s) at this time. She was informed that although she is beyond the gestational age at which time elective termination of pregnancy is available in [place], this option is currently available in certain other states.The patient was informed that she  will be contacted once the test results become available. In keeping with the CURES Act, results may be automatically sent to her MyChart account once finalized.  The patient was advised that predicted fetal sex may be included in certain reports.  In addition, the information in the reports may be confusing and/or anxiety-provoking.  Therefore, she may wish to avoid accessing the report(s) until after speaking with her genetic counselor.Weekly antenatal testing is recommended.Follow up UA Dopplers to be scheduled in 1 week.Interval growth recommended in 2 weeks.Total time spent by the provider on the day of service, which includes time spent on chart review, education, coordination of care/services and counseling: 55 minutesElectronically Signed by Mliss DELENA Hering, Licensed Genetic Counselor, January 02, 2024

## 2024-01-06 ENCOUNTER — Encounter
Admit: 2024-01-06 | Payer: PRIVATE HEALTH INSURANCE | Attending: Student in an Organized Health Care Education/Training Program | Primary: Obstetrics and Gynecology

## 2024-01-06 ENCOUNTER — Telehealth
Admit: 2024-01-06 | Payer: PRIVATE HEALTH INSURANCE | Attending: Student in an Organized Health Care Education/Training Program | Primary: Obstetrics and Gynecology

## 2024-01-06 NOTE — Telephone Encounter [36]
 Called patient to inform her of negative CMV PCR on amniocentesis. She did not answer. Will send MyCHart message as voicemail was not identified.Mariah Ferguson, MDMaternal Fetal Medicine

## 2024-01-07 ENCOUNTER — Encounter: Admit: 2024-01-07 | Payer: PRIVATE HEALTH INSURANCE | Primary: Obstetrics and Gynecology

## 2024-01-07 ENCOUNTER — Encounter
Admit: 2024-01-07 | Payer: PRIVATE HEALTH INSURANCE | Attending: Obstetrics and Gynecology | Primary: Obstetrics and Gynecology

## 2024-01-09 ENCOUNTER — Telehealth: Admit: 2024-01-09 | Payer: PRIVATE HEALTH INSURANCE | Attending: MS" | Primary: Obstetrics and Gynecology

## 2024-01-09 ENCOUNTER — Encounter
Admit: 2024-01-09 | Payer: PRIVATE HEALTH INSURANCE | Attending: Obstetrics and Gynecology | Primary: Obstetrics and Gynecology

## 2024-01-09 ENCOUNTER — Inpatient Hospital Stay: Admit: 2024-01-09 | Discharge: 2024-01-09 | Payer: PRIVATE HEALTH INSURANCE | Primary: Obstetrics and Gynecology

## 2024-01-09 DIAGNOSIS — O0990 Supervision of high risk pregnancy, unspecified, unspecified trimester: Secondary | ICD-10-CM

## 2024-01-09 DIAGNOSIS — O365920 Maternal care for other known or suspected poor fetal growth, second trimester, not applicable or unspecified: Secondary | ICD-10-CM

## 2024-01-09 DIAGNOSIS — O0992 Supervision of high risk pregnancy, unspecified, second trimester: Secondary | ICD-10-CM

## 2024-01-09 DIAGNOSIS — Z3A26 26 weeks gestation of pregnancy: Secondary | ICD-10-CM

## 2024-01-09 DIAGNOSIS — O365990 Maternal care for other known or suspected poor fetal growth, unspecified trimester, not applicable or unspecified: Secondary | ICD-10-CM

## 2024-01-09 DIAGNOSIS — O3510X Suspected chromosome anomaly of fetus affecting management of mother in singleton pregnancy, antepartum: Secondary | ICD-10-CM

## 2024-01-09 NOTE — Telephone Encounter [36]
 Informed patient that we need a sample of blood for DNA preparation (to be used for [facility] studies on prenatal specimen). Although she had blood drawn on 01/02/24, a tube was not drawn for this purpose.  Patient plans to have blood drawn at [facility] today.

## 2024-01-14 ENCOUNTER — Inpatient Hospital Stay: Admit: 2024-01-14 | Payer: PRIVATE HEALTH INSURANCE | Primary: Obstetrics and Gynecology

## 2024-01-16 LAB — CHROMOSOME ANALYSIS (CYTOGENETICS) (YMG): BKR BANDING RESOLUTION: 400

## 2024-01-17 ENCOUNTER — Telehealth: Admit: 2024-01-17 | Payer: PRIVATE HEALTH INSURANCE | Attending: MS" | Primary: Obstetrics and Gynecology

## 2024-01-17 NOTE — Telephone Encounter [36]
 I attempted to call and inform the patient about the normal/negative chromosome analysis on her amniocentesis. She could not be reached and a message was left. Microarray and exome testing are still pending.

## 2024-01-20 LAB — MATERNAL CELL CONTAMINATION (DNA LAB)     (YMG)

## 2024-01-20 LAB — EXTENDED GENE ANALYSIS (DNA LAB) (YMG)

## 2024-01-21 ENCOUNTER — Ambulatory Visit: Admit: 2024-01-21 | Payer: PRIVATE HEALTH INSURANCE | Primary: Obstetrics and Gynecology

## 2024-01-21 ENCOUNTER — Telehealth: Admit: 2024-01-21 | Payer: PRIVATE HEALTH INSURANCE | Attending: MS" | Primary: Obstetrics and Gynecology

## 2024-01-21 NOTE — Telephone Encounter [36]
 I informed Mariah Ferguson that extended gene analysis was performed on the amniocentesis specimen, with negative (normal) results.  In addition, cytogenetic analysis revealed a typical female karyotype. This significantly reduces but does not completely eliminate the possibility that the fetus has a genetic syndrome. The underlying cause of the suspected fetal growth restriction is still uncertain. Of note, prenatal microarray results are pending.

## 2024-01-24 LAB — MICROARRAY/ARRAY CGH (CYTOGENETICS) (YMG): BKR ARRAY ANALYSIS RESULT FLAG: NORMAL

## 2024-01-27 ENCOUNTER — Telehealth: Admit: 2024-01-27 | Payer: PRIVATE HEALTH INSURANCE | Attending: MS" | Primary: Obstetrics and Gynecology

## 2024-01-27 NOTE — Telephone Encounter [36]
 I informed Mariah Ferguson that the prenatal microarray results are normal. Therefore, we do not have an explanation for the fetal growth restriction.

## 2024-01-29 ENCOUNTER — Inpatient Hospital Stay: Admit: 2024-01-29 | Discharge: 2024-01-29 | Payer: MEDICAID | Primary: Obstetrics and Gynecology

## 2024-01-29 ENCOUNTER — Ambulatory Visit
Admit: 2024-01-29 | Payer: PRIVATE HEALTH INSURANCE | Attending: Obstetrics and Gynecology | Primary: Obstetrics and Gynecology

## 2024-01-29 ENCOUNTER — Telehealth
Admit: 2024-01-29 | Payer: PRIVATE HEALTH INSURANCE | Attending: Obstetrics and Gynecology | Primary: Obstetrics and Gynecology

## 2024-01-29 VITALS — BP 100/60 | Wt 104.5 lb

## 2024-01-29 DIAGNOSIS — Z3483 Encounter for supervision of other normal pregnancy, third trimester: Secondary | ICD-10-CM

## 2024-01-29 DIAGNOSIS — Z3400 Encounter for supervision of normal first pregnancy, unspecified trimester: Principal | ICD-10-CM

## 2024-01-29 DIAGNOSIS — O365990 Maternal care for other known or suspected poor fetal growth, unspecified trimester, not applicable or unspecified: Secondary | ICD-10-CM

## 2024-01-29 LAB — GTT 1 HOUR: BKR GLUCOSE TOLERANCE TEST 1 HOUR NEW: 128 mg/dL

## 2024-01-29 MED ORDER — ONDANSETRON 4 MG DISINTEGRATING TABLET
4 | ORAL_TABLET | Freq: Three times a day (TID) | 2 refills | 7.00000 days | Status: AC | PRN
Start: 2024-01-29 — End: ?

## 2024-01-29 NOTE — Progress Notes [1]
 73w6dRoye is feeling well, reports lots of fetal movement.Next MFM u/s is 02/11/24. Looks like she cancelled her 12/23 and 12/30 scans.She has been unable to get in d/t transportation issues. We discd weekly UA Dopplers, which she is willing to do. Reordered and [facility] staff will call MFM now to help book.Needs to do GDM screen. She will try to do so today.We discd marijuana use, which she relies on to manage nausea. Advised cessation. Zofran  4mg  ODT rxd. Potential fetal brain impact discd and placenta impact of smoking also discd. She will do her best to stop. 28W FOLDER, CALLING TALK.RTO Georgeanne JAYSON Laundry, CNM1/07/2024

## 2024-01-30 ENCOUNTER — Encounter
Admit: 2024-01-30 | Payer: PRIVATE HEALTH INSURANCE | Attending: Obstetrics and Gynecology | Primary: Obstetrics and Gynecology

## 2024-01-30 DIAGNOSIS — O365990 Maternal care for other known or suspected poor fetal growth, unspecified trimester, not applicable or unspecified: Principal | ICD-10-CM

## 2024-01-30 NOTE — Telephone Encounter [36]
 Spoke to front end at MFM to schedule Mariah Ferguson with weekly fetal doppler testing and they said have no visits till February so has to send message to management to book for weekly testing. CNM ADDENDUMI see that she has been scheduled for 02/04/24.Laura C Sundstrom, CNM1/08/2024

## 2024-02-04 ENCOUNTER — Inpatient Hospital Stay: Admit: 2024-02-04 | Discharge: 2024-02-04 | Payer: MEDICAID | Primary: Obstetrics and Gynecology

## 2024-02-04 ENCOUNTER — Encounter
Admit: 2024-02-04 | Payer: PRIVATE HEALTH INSURANCE | Attending: Obstetrics and Gynecology | Primary: Obstetrics and Gynecology

## 2024-02-04 DIAGNOSIS — O365930 Maternal care for other known or suspected poor fetal growth, third trimester, not applicable or unspecified: Principal | ICD-10-CM

## 2024-02-04 DIAGNOSIS — O365990 Maternal care for other known or suspected poor fetal growth, unspecified trimester, not applicable or unspecified: Principal | ICD-10-CM

## 2024-02-06 ENCOUNTER — Encounter
Admit: 2024-02-06 | Payer: PRIVATE HEALTH INSURANCE | Attending: Obstetrics and Gynecology | Primary: Obstetrics and Gynecology

## 2024-02-06 DIAGNOSIS — O0990 Supervision of high risk pregnancy, unspecified, unspecified trimester: Principal | ICD-10-CM

## 2024-02-11 ENCOUNTER — Encounter
Admit: 2024-02-11 | Payer: PRIVATE HEALTH INSURANCE | Attending: Obstetrics and Gynecology | Primary: Obstetrics and Gynecology

## 2024-02-11 ENCOUNTER — Inpatient Hospital Stay: Admit: 2024-02-11 | Discharge: 2024-02-11 | Payer: PRIVATE HEALTH INSURANCE | Primary: Obstetrics and Gynecology

## 2024-02-11 DIAGNOSIS — O0990 Supervision of high risk pregnancy, unspecified, unspecified trimester: Secondary | ICD-10-CM

## 2024-02-11 DIAGNOSIS — O365990 Maternal care for other known or suspected poor fetal growth, unspecified trimester, not applicable or unspecified: Secondary | ICD-10-CM

## 2024-02-11 DIAGNOSIS — O3433 Maternal care for cervical incompetence, third trimester: Secondary | ICD-10-CM

## 2024-02-11 DIAGNOSIS — O365930 Maternal care for other known or suspected poor fetal growth, third trimester, not applicable or unspecified: Secondary | ICD-10-CM

## 2024-02-11 DIAGNOSIS — Z3A30 30 weeks gestation of pregnancy: Secondary | ICD-10-CM

## 2024-02-17 ENCOUNTER — Encounter
Admit: 2024-02-17 | Payer: PRIVATE HEALTH INSURANCE | Attending: Obstetrics and Gynecology | Primary: Obstetrics and Gynecology

## 2024-02-17 ENCOUNTER — Inpatient Hospital Stay
Admission: RE | Admit: 2024-02-17 | Discharge: 2024-02-27 | Payer: PRIVATE HEALTH INSURANCE | Attending: Obstetrics and Gynecology | Admitting: Obstetrics and Gynecology | Primary: Obstetrics and Gynecology

## 2024-02-17 DIAGNOSIS — O365990 Maternal care for other known or suspected poor fetal growth, unspecified trimester, not applicable or unspecified: Principal | ICD-10-CM

## 2024-02-17 LAB — CBC WITHOUT DIFFERENTIAL
BKR WAM ANC (ABSOLUTE NEUTROPHIL COUNT): 10.43 x 1000/ÂµL — ABNORMAL HIGH (ref 2.00–7.60)
BKR WAM HEMATOCRIT: 43.1 % (ref 35.00–45.00)
BKR WAM HEMOGLOBIN: 14.6 g/dL (ref 11.7–15.5)
BKR WAM MCH: 30.9 pg (ref 27.0–33.0)
BKR WAM MCHC: 33.9 g/dL (ref 31.0–36.0)
BKR WAM MCV: 91.3 fL (ref 80.0–100.0)
BKR WAM MPV: 10.5 fL (ref 8.0–12.0)
BKR WAM PLATELETS: 253 10*3/uL (ref 150–420)
BKR WAM RDW-CV: 12.5 % (ref 11.0–15.0)
BKR WAM RED BLOOD CELL COUNT.: 4.72 M/ÂµL (ref 4.00–6.00)
BKR WAM WHITE BLOOD CELL COUNT: 13.5 10*3/uL — ABNORMAL HIGH (ref 4.0–11.0)

## 2024-02-17 LAB — URINALYSIS WITH CULTURE REFLEX      (BH LMW YH)
BKR BILIRUBIN, UA: NEGATIVE
BKR BLOOD, UA: NEGATIVE
BKR GLUCOSE, UA: NEGATIVE
BKR KETONES, UA: NEGATIVE
BKR LEUKOCYTE ESTERASE, UA: NEGATIVE
BKR NITRITE, UA: NEGATIVE
BKR PH, UA: 6.5 (ref 5.5–7.5)
BKR PROTEIN, UA: NEGATIVE
BKR SPECIFIC GRAVITY, UA: 1.017 (ref 1.005–1.030)
BKR UROBILINOGEN, UA: <2 mg/dL (ref <=2.0)

## 2024-02-17 LAB — CHLAMYDIA TRACHOMATIS, NAAT (LAB ORDER ONLY) (BH GH L LMW YH): BKR CHLAMYDIA, DNA PROBE: NEGATIVE

## 2024-02-17 LAB — TREPONEMA PALLIDUM (SYPHILIS) ANTIBODY W/REFLEX
BKR TREPONEMA PALLIDUM ANTIBODY INITIAL RESULT: <0.1 {index}
BKR TREPONEMA PALLIDUM ANTIBODY TOTAL, SERUM: NONREACTIVE

## 2024-02-17 LAB — CANDIDA & TRICHOMONAS VAGINITIS BY NAAT (BH GH LMW YH)
BKR CANDIDA GLABRATA NAAT: NEGATIVE
BKR CANDIDA SPECIES GROUP NAAT: POSITIVE — AB
BKR TRICHOMONAS VAGINALIS NAAT: NEGATIVE

## 2024-02-17 LAB — UA REFLEX CULTURE

## 2024-02-17 LAB — BACTERIAL VAGINOSIS BY NAAT: BKR BACTERIAL VAGINOSIS NAAT: NEGATIVE

## 2024-02-17 LAB — NEISSERIA GONORRHEA, NAAT (LAB ORDER ONLY)   (BH GH L LMW YH): BKR NEISSERIA GONORRHOEAE, DNA PROBE: NEGATIVE

## 2024-02-17 MED ORDER — SODIUM CHLORIDE 0.9 % (FLUSH) INJECTION SYRINGE
0.9 | INTRAVENOUS | Status: DC | PRN
Start: 2024-02-17 — End: 2024-02-19

## 2024-02-17 MED ORDER — SODIUM CHLORIDE 0.9 % (FLUSH) INJECTION SYRINGE
0.9 | Freq: Three times a day (TID) | INTRAVENOUS | Status: DC
Start: 2024-02-17 — End: 2024-02-19
  Administered 2024-02-17 – 2024-02-18 (×4): 0.9 mL via INTRAVENOUS

## 2024-02-17 MED ORDER — ONDANSETRON 4 MG DISINTEGRATING TABLET
4 | Freq: Three times a day (TID) | Status: DC | PRN
Start: 2024-02-17 — End: 2024-02-19

## 2024-02-17 MED ORDER — ACETAMINOPHEN 325 MG TABLET
325 | Freq: Four times a day (QID) | ORAL | Status: DC | PRN
Start: 2024-02-17 — End: 2024-02-18
  Administered 2024-02-17: 09:00:00 325 mg via ORAL

## 2024-02-17 MED ORDER — CHLORHEXIDINE GLUCONATE 2 % TOWELETTE
2 | TOPICAL | Status: DC | PRN
Start: 2024-02-17 — End: 2024-02-19

## 2024-02-17 MED ORDER — LACTATED RINGERS IV BOLUS NEW BAG (DROPS CHARGE)
Freq: Once | INTRAVENOUS | Status: CP
Start: 2024-02-17 — End: ?
  Administered 2024-02-17: 07:00:00 1000.000 mL/h via INTRAVENOUS

## 2024-02-17 MED ORDER — PRENATAL VIT,CALCIUM 27-FERROUS FUM 60 MG IRON-FOLIC ACID 1 MG TABLET
60 | Freq: Every day | ORAL | Status: DC
Start: 2024-02-17 — End: 2024-02-19
  Administered 2024-02-18: 10:00:00 60 mg iron-1 mg via ORAL

## 2024-02-17 MED ORDER — ONDANSETRON HCL (PF) 4 MG/2 ML INJECTION SOLUTION
4 | Freq: Four times a day (QID) | INTRAVENOUS | Status: DC | PRN
Start: 2024-02-17 — End: 2024-02-18
  Administered 2024-02-18: 03:00:00 4 mL via INTRAVENOUS

## 2024-02-17 MED ORDER — DIPHENHYDRAMINE 25 MG ORAL TAB/CAP (WRAPPED ERX)
25 | Freq: Once | ORAL | Status: CP
Start: 2024-02-17 — End: ?
  Administered 2024-02-17: 20:00:00 25 mg via ORAL

## 2024-02-17 NOTE — OB Triage [1020011]
 Mariah Ferguson is a 29 y.o. yr old G1P0000, Estimated Date of Delivery: 04/16/24 now at [redacted]w[redacted]d wks EGA, ongoing observation for contractions and vaginal pressure iso US  indicated cerclage for shortened cervix identified at anatomy US .  Dec CL 2.49cm 20w -> Rx PV Prog 200mg  QH->1.71cm 21w ->1.2cm 22w, internal os 2cm ->11/26 Shirodkar Cerclage, *2 SUTURES* @12  oclock22w: Early onset sFGR, genetics [x] , amnio [x]  results: NegWkly dopplers, Q2w EFW, NSTs @32w +[redacted]w[redacted]d EFW <1%, nl dopplersVitals:  02/17/24 0730 BP: 100/66 Pulse: 72 Resp: 16 Temp: 98.4 ?F (36.9 ?C) TempSrc: Temporal SpO2: 100% Lab Results Component Value Date  CLARITYU Clear 02/17/2024  COLORU Yellow 02/17/2024  SPECGRAV 1.017 02/17/2024  PHUR 6.5 02/17/2024  PROTEINUA Negative 02/17/2024  GLUCOSEU Negative 02/17/2024  KETONESU Negative 02/17/2024  BLOODU Negative 02/17/2024  BILIRUBINUR Negative 02/17/2024  LEUKOCYTESUR Negative 02/17/2024  NITRITE Negative 02/17/2024  UROBILINOGEN <2.0 02/17/2024  TAUS per Dr. Yildirim:Presentation: 	CephalicPlacenta:	AnteriorMVP: 		5 cmAbdomen - gravid, NT,contractions palpate moderateFHR = 130, moderate variability, +accels, occasional variable decels U/C = 2 in 10 minutesSVE = visually closed with no tension on cerclage, digitally FT, no tension palpatedA - IUP @ 42w4dGBS pendingPreterm contractions without cervical changeCerclage in place with no tension apparentMembranes intact sFGRUA not suspicious for UTIP - GBS and vaginitis panel sentContraction monitoringRegular dietCase reviewed with Dr. Efrain MFM and Dr. Lynell MFM fellowEliza GORMAN Ferguson, CNM1/26/20269:51 AMAddendum:Mariah Ferguson reports ongoing contractions, not worse but not better.  No LOF, no VB.FHR: 130bpmVariability: moderateAccelerations: presentDecelerations: intermittent variable decelsContractions: 2 in 10 minGiven ongoing preterm ctx and variable decelerations iso cerclage and sFGR, recommend overnight observation.  Pt in agreement.  Consult Dr. Sinnott, MFM.  Care of patient transferred to MFM for antepartum admission.  [facility] CNMs will follow pt socially through the remainder of her hospital stay, and resume care for birth or upon discharge.  RN team updated.Mariah Ferguson, CNM1/26/2026 1:47 PM ALLERGIES: She has no known allergies. PAST MEDICAL HISTORY: She  has a past medical history of Abdominal pain, unspecified abdominal location (06/16/2022), Hydrosalpinx (01/17/2022), PCOS (polycystic ovarian syndrome) (01/17/2022), PID (acute pelvic inflammatory disease), Pre-diabetes (01/24/2022), Right ovarian cyst (01/17/2022), and Trichomonal infection. PAST SURGICAL HISTORY: She  has a past surgical history that includes Cyst Removal. PAST OBSTETRICAL HISTORY: OB History   Gravida 1  Para 0  Term 0  Preterm 0  AB 0  Living 0   SAB 0  IAB 0  Ectopic 0  Molar 0  Multiple 0  Live Births 0    # Outcome Date GA Labor/2nd Weight Sex Type Anes PTL Lv A1 A5  1 Current                FAMILY HISTORY: Her family history includes Diabetes Type 2 in her mother; Hypertension in her mother; Ovarian cancer in her maternal great-grandmother; Polycystic ovary syndrome in her mother. SOCIAL HISTORY: She  reports that she has never smoked. She has never used smokeless tobacco. She reports that she does not currently use alcohol. She reports current drug use. Drug: Marijuana. MEDICATIONS: She has a current medication list which includes the following prescription(s): doxylamine -pyridoxine , vit B6, (BONJESTA ) 20-20 mg TbID - Take 1 tablet by mouth 2 (two) times daily as needed (nausea), ondansetron  (ZOFRAN -ODT) 4 mg disintegrating tablet - Place 1 tablet (4 mg total) onto the tongue every 8 (eight) hours as needed for nausea, and progesterone  (PROMETRIUM ) 200 mg capsule - Place 1 capsule (200 mg total) vaginally nightly, and the following Facility-Administered Medications: acetaminophen  (TYLENOL ) tablet 650 mg and ondansetron  (PF) (ZOFRAN )  injection 4 mg. PROBLEM LIST: Patient Active Problem List Diagnosis  Hydrosalpinx  Right ovarian cyst  PCOS (polycystic ovarian syndrome)  Pre-diabetes  Left ovarian cyst  Supervision of normal first pregnancy, antepartum  Short cervical length during pregnancy in second trimester  Shirodkar cerclage with TWO SUTURES PRESENT  Fetal growth restriction antepartum

## 2024-02-17 NOTE — Plan of Care [1000001]
 Plan of Care Overview/ Patient Mariah Ferguson is a 28yo G1P0 @ 31+4 admitted for AFT and prolonged monitoring. C/o occasional ucxs, mild to palpation. Denies LOF, VB, Reports +FM. AVSS. GBS pend. CEFM cat II, occasional variable decels. Ucxs 2/10. See MAR and flowsheet for details.

## 2024-02-17 NOTE — OB Triage [1020011]
 Obstetrics - Triage Note Subjective: Mariah Ferguson is a 29 y.o. G1P0000 @ [redacted]w[redacted]d EGA, EDD 04/16/2024, by Last Menstrual Period who presents to [facility] Triage for vaginal pressure. Pregnancy c/b cervical insufficiency s/p Shirodkar cerclage placement on 12/18/23.Reports onset of painful uterine contractions at 11pm last night. States that shortly after then she began feeling a lot of vaginal pressure. The pressure is constant. She is also not feeling as much fetal movement as earlier. Obstetric Review of Systems:Leakage of fluid: NoVaginal Bleeding: NoneFetal Movement: as aboveContractions: as above Dating Summary  Working EDD: 04/16/2024 set by [name], STUDENT on 09/09/2023 based on Last Menstrual Period on 07/11/2023 (Exact Date) Based On EDD GA Diff User Date  Last Menstrual Period on 07/11/2023 (Exact Date) 04/16/2024 Working [name], STUDENT 09/09/2023    Ultrasound on 10/04/2023 04/17/2024 -1d [name], CNM 10/23/2023  GA:  [redacted]w[redacted]d  Past Medical History[1]Past Surgical History[2]Pregnancy history:17.58  Objective:   Physical Exam:Physical ExamGen: NADAbd: soft, gravid, nontenderLE: no erythema/edemaCervical Exam:Cervical Exam**Dilation: Closed (visually, no tension on cerclage)Method: Sterile speculumOB Examiner: Shetara Launer, MDPatient exam or treatment required medical chaperone.The sensitive parts of the examination were performed with chaperone present: Dayna RNNST:Fetal Heart Rate Assessment: Baseline: 125 bpm, Variability: ModerateAccelerations: PresentDecelerations: rare variable decelsToco:Uterine contractions: frequency 0 per 10 minutes averaged over 30 minutesReview of LabsCBC Lab Results Component Value Date  WBC 15.7 (H) 01/02/2024  HGB 12.3 01/02/2024  HCT 35.90 01/02/2024  HCT 43.0 05/08/2023  PLT 290 01/02/2024  Syph Lab Results Component Value Date  TPALLAB Non-Reactive 01/02/2024  Blood Type Lab Results Component Value Date  LABABO A 12/18/2023  RH POS 12/18/2023  Hep B Lab Results Component Value Date  HEPBSAG Negative 10/23/2023  Ab Scrn Lab Results Component Value Date  LABANTI NEG 12/18/2023  HIV Lab Results Component Value Date  HIV1X2 Negative 01/02/2024  GBS No results found for: RHENDA MINTA THAYER PHILL, SGRBPCR [place] Lab Results Component Value Date  LABCHLA NOT DETECTED 09/09/2023  Rub Lab Results Component Value Date  RUBELLAIGG Positive 10/23/2023  GC Lab Results Component Value Date  LABNGO NOT DETECTED 09/09/2023   Assessment & Plan: 29 y.o. G1P0000 @ [redacted]w[redacted]d EGA, presenting to OB Triage for vaginal pressure. Pregnancy c/b cervical insufficiency s/p Shirodkar cerclage placement on 12/18/2023. On exam today, cervix visually closed and cerclage not on tension. Given significant discomfort, will plan for 2hr recheck to ensure patient is not progressing in labour. Discussed this with patient's primary CNM team [facility] who will assume care.__________________________Abigail CHRISTELLA Grange, MD 1/26/20267:20 AM   [1] Past Medical History:Diagnosis Date  Abdominal pain, unspecified abdominal location 06/16/2022  Hydrosalpinx 01/17/2022  Left, inpt admission 06/2021  PCOS (polycystic ovarian syndrome) 01/17/2022  Multiple follicles seen on US   PID (acute pelvic inflammatory disease)   06/2021  Pre-diabetes 01/24/2022  12/2021 HgbA1C 5.9% Repeat 62m, ordered  Right ovarian cyst 01/17/2022  01/17/22 ED US : right adnexa demonstrates 5.9 x 5.2 x 4.8 cm multiloculated cystic structure that may represent a combination of hemorrhagic ovarian cysts versus a hemorrhagic ovarian cyst and hematosalpinx. Rpt US  in 1 month [ ]  ordered   Trichomonal infection   06/2021 [2] Past Surgical History:Procedure Laterality Date  CYST REMOVAL

## 2024-02-17 NOTE — Evaluation [3041234]
 Portable OB Ultrasound Reporting FormRoysheka Hood28 y.o.Patient's last menstrual period was 07/11/2023 (exact date).Estimated Date of Delivery: 3/26/2631w4dThere are no answered order specific questions. Approach: AbdominalPresentation: CephalicMaximum Vertical Pocket: 5 cmAnterior placentaImpressions: bedside Us  findings as above. Mariah Ferguson Mariah Xue Low, MD - PGY-1Yale UniversityDepartment of OBGYN

## 2024-02-17 NOTE — H&P [4]
 Obstetrics - History and Physical HPI: Mariah Ferguson is a 29 y.o. G1P0000 @ [redacted]w[redacted]d EGA, EDD 04/16/2024, by Last Menstrual Period who presents for preterm labor evaluation. Please see ob triage evaluations by Dr. Araceli and Mariah Ferguson. Exam by providers visually closed, and digitally FT with cerclage not on tension. Received IVF and tylenol  with good effect. UA and vaginitis swab collected pending, with increased vaginal discharge since cerclage placement. No pain with urination, or increased need to urinate. Also with intermittent late decelerations. Given c/f preterm labor and AFT decision made to admit for prolonged monitoring. At bedside, patient reports no further contractions. Feels comfortable. No vaginal bleeding, no leakage of fluid. Baby is moving ok. No acute concerns at this time. Dating Summary  Working EDD: 04/16/2024 set by Mariah Ferguson, STUDENT on 09/09/2023 based on Last Menstrual Period on 07/11/2023 (Exact Date) Based On EDD GA Diff User Date  Last Menstrual Period on 07/11/2023 (Exact Date) 04/16/2024 Working Mariah Ferguson, STUDENT 09/09/2023    Ultrasound on 10/04/2023 04/17/2024 -1d Mariah Ferguson, Mariah 10/23/2023  GA:  [redacted]w[redacted]d   Past history: EFY:Ejdu Medical History[1]PSH:Past Surgical History[2]Patient Active Problem List Diagnosis  Hydrosalpinx  Right ovarian cyst  PCOS (polycystic ovarian syndrome)  Pre-diabetes  Left ovarian cyst  Supervision of normal first pregnancy, antepartum  Short cervical length during pregnancy in second trimester  Shirodkar cerclage with TWO SUTURES PRESENT  Fetal growth restriction antepartum OB History Gravida Para Term Preterm AB Living 1 0 0 0 0 0 SAB IAB Ectopic Molar Multiple Live Births 0 0 0 0 0 0  # Outcome Date GA Lbr Len/2nd Weight Sex Type Anes PTL Lv 1 Current          Current MedicationsPrescriptions Prior to Admission[3]AllergiesAllergies[4]Social HistorySocial History Tobacco Use  Smoking status: Never  Smokeless tobacco: Never Substance Use Topics  Alcohol use: Not Currently  Objective:  Physical Exam:Gen: NADCV: RRRPulm: CTABAbd: Gravid, soft, non-tenderLE: WWPVaginal/Cervical Exam: exam not repeated given recently completed by EDDY Ferguson WDU:Qzujo Heart Rate Assessment: 130 bpm / mod variability / + accels / intermittent variable and late appearing decelsUterine Contractions: rare ctxUltrasound:Cephalic PresentationAnterior Placentation 5 cm MVP Assessment & Plan: 29 y.o. G1P0000 @ [redacted]w[redacted]d  EGA, EDD 04/16/2024, by Last Menstrual Period who presents for preterm labor evaluation. Ruled out for PTL given closed cervix and spaced out contractions. Infectious workup ongoing; though overall reassuring clinical status. Will re-evaluate if contractions resume. Regarding AFT, suspect in the setting of contractions and fetal growth restriction. Will admit for prolonged monitoring. Tracing overall reassuring between decels. Will put on continuous monitoring in the interim. Pending course will may de-escalate frequency of tracing. No steroids indicated at this time. Will plan on BPP tomorrow. Discussed with Mariah Ferguson. Mariah Ferguson, MD1/26/2026 1:25 PM   [1] Past Medical History:Diagnosis Date  Abdominal pain, unspecified abdominal location 06/16/2022  Hydrosalpinx 01/17/2022  Left, inpt admission 06/2021  PCOS (polycystic ovarian syndrome) 01/17/2022  Multiple follicles seen on US   PID (acute pelvic inflammatory disease)   06/2021  Pre-diabetes 01/24/2022  12/2021 HgbA1C 5.9% Repeat 74m, ordered  Right ovarian cyst 01/17/2022  01/17/22 ED US : right adnexa demonstrates 5.9 x 5.2 x 4.8 cm multiloculated cystic structure that may represent a combination of hemorrhagic ovarian cysts versus a hemorrhagic ovarian cyst and hematosalpinx. Rpt US  in 1 month [ ]  ordered   Trichomonal infection   06/2021 [2] Past Surgical History:Procedure Laterality Date  CYST REMOVAL   [3] Medications Prior to Admission Medication Sig Dispense Refill Last Dose/Taking  doxylamine -pyridoxine , vit B6, (BONJESTA ) 20-20 mg TbID Take 1 tablet by mouth 2 (two) times daily as needed (nausea). 60 tablet 1   ondansetron  (ZOFRAN -ODT) 4 mg disintegrating tablet Place 1 tablet (4 mg total) onto the tongue every 8 (eight) hours as needed for nausea. 60 tablet 1   progesterone  (PROMETRIUM ) 200 mg capsule Place 1 capsule (200 mg total) vaginally nightly. 90 capsule 0  [4] No Known Allergies

## 2024-02-18 ENCOUNTER — Encounter: Admit: 2024-02-18 | Payer: PRIVATE HEALTH INSURANCE | Attending: Anesthesiology | Primary: Obstetrics and Gynecology

## 2024-02-18 ENCOUNTER — Ambulatory Visit: Admit: 2024-02-18 | Payer: MEDICAID | Primary: Obstetrics and Gynecology

## 2024-02-18 ENCOUNTER — Inpatient Hospital Stay: Admit: 2024-02-18 | Payer: MEDICAID | Attending: Student in an Organized Health Care Education/Training Program

## 2024-02-18 ENCOUNTER — Inpatient Hospital Stay: Admit: 2024-02-18 | Payer: MEDICAID

## 2024-02-18 ENCOUNTER — Ambulatory Visit: Admit: 2024-02-18 | Payer: PRIVATE HEALTH INSURANCE | Primary: Obstetrics and Gynecology

## 2024-02-18 LAB — URINALYSIS-MACROSCOPIC W/REFLEX MICROSCOPIC
BKR BILIRUBIN, UA: NEGATIVE
BKR GLUCOSE, UA: NEGATIVE
BKR KETONES, UA: NEGATIVE
BKR LEUKOCYTE ESTERASE, UA: NEGATIVE
BKR NITRITE, UA: NEGATIVE
BKR PH, UA: 6 (ref 5.5–7.5)
BKR SPECIFIC GRAVITY, UA: 1.035 — ABNORMAL HIGH (ref 1.005–1.030)
BKR UROBILINOGEN, UA: <2 mg/dL (ref <=2.0)

## 2024-02-18 LAB — COMPREHENSIVE METABOLIC PANEL
BKR A/G RATIO: 1 (ref 1.0–2.2)
BKR ALANINE AMINOTRANSFERASE (ALT): 16 U/L (ref <=35)
BKR ALBUMIN: 3.4 g/dL — ABNORMAL LOW (ref 3.6–5.1)
BKR ALKALINE PHOSPHATASE: 164 U/L — ABNORMAL HIGH (ref 35–104)
BKR ANION GAP: 16 (ref 7–17)
BKR ASPARTATE AMINOTRANSFERASE (AST): 30 U/L (ref <35)
BKR AST/ALT RATIO: 1.9
BKR BILIRUBIN TOTAL: 0.2 mg/dL (ref <=1.2)
BKR BLOOD UREA NITROGEN: 10 mg/dL (ref 6–20)
BKR BUN / CREAT RATIO: 16.1 (ref 8.0–23.0)
BKR CALCIUM: 8 mg/dL — ABNORMAL LOW (ref 8.8–10.2)
BKR CHLORIDE: 101 mmol/L (ref 98–107)
BKR CO2: 17 mmol/L — ABNORMAL LOW (ref 20–30)
BKR CREATININE DELTA: 0.12
BKR CREATININE: 0.62 mg/dL (ref 0.51–0.95)
BKR EGFR, CREATININE (CKD-EPI 2021): >60 mL/min/{1.73_m2} (ref >=60)
BKR GLOBULIN: 3.3 g/dL (ref 1.9–3.9)
BKR GLUCOSE: 94 mg/dL (ref 70–140)
BKR POTASSIUM: 4.3 mmol/L (ref 3.3–5.5)
BKR PROTEIN TOTAL: 6.7 g/dL (ref 5.9–8.3)
BKR SODIUM: 134 mmol/L — ABNORMAL LOW (ref 136–145)

## 2024-02-18 LAB — PROTEIN, TOTAL, URINE RANDOM WITH CREATININE    (BH GH LMW YH)
BKR CREATININE, URINE, RANDOM: 96 mg/dL
BKR PROTEIN URINE RANDOM MG/DL: 18.3 mg/dL
BKR PROTEIN/CREATININE RATIO, URINE, RANDOM: 0.2 mg/mg{creat} — ABNORMAL HIGH (ref <0.1)

## 2024-02-18 LAB — URINE MICROSCOPIC     (BH GH LMW YH)
BKR HYALINE CASTS, UA (INSTRUMENT): 4 /LPF — ABNORMAL HIGH (ref 0–3)
BKR RBC/HPF, UA (INSTRUMENT): 3 /HPF — ABNORMAL HIGH (ref 0–2)
BKR URINE SQUAMOUS EPITHELIAL CELLS, UA (INSTRUMENT): 2 /HPF (ref 0–5)
BKR WBC/HPF, UA (INSTRUMENT): 3 /HPF (ref 0–5)

## 2024-02-18 LAB — CBC WITH AUTO DIFFERENTIAL
BKR WAM ABSOLUTE IMMATURE GRANULOCYTES.: 0.07 x 1000/ÂµL (ref 0.00–0.30)
BKR WAM ABSOLUTE LYMPHOCYTE COUNT.: 1.25 x 1000/ÂµL (ref 0.60–3.70)
BKR WAM ABSOLUTE NRBC: 0.02 x 1000/ÂµL (ref 0.00–1.00)
BKR WAM ANC (ABSOLUTE NEUTROPHIL COUNT): 13.75 x 1000/ÂµL — ABNORMAL HIGH (ref 2.00–7.60)
BKR WAM BASOPHIL ABSOLUTE COUNT.: 0.02 x 1000/ÂµL (ref 0.00–1.00)
BKR WAM BASOPHILS: 0.1 % (ref 0.0–1.4)
BKR WAM EOSINOPHIL ABSOLUTE COUNT.: 0 x 1000/ÂµL (ref 0.00–1.00)
BKR WAM EOSINOPHILS: 0 % (ref 0.0–5.0)
BKR WAM HEMATOCRIT: 38.2 % (ref 35.00–45.00)
BKR WAM HEMOGLOBIN: 12.8 g/dL (ref 11.7–15.5)
BKR WAM IMMATURE GRANULOCYTES: 0.4 % (ref 0.0–1.0)
BKR WAM LYMPHOCYTES: 7.9 % — ABNORMAL LOW (ref 17.0–50.0)
BKR WAM MCH: 30.4 pg (ref 27.0–33.0)
BKR WAM MCHC: 33.5 g/dL (ref 31.0–36.0)
BKR WAM MCV: 90.7 fL (ref 80.0–100.0)
BKR WAM MONOCYTE ABSOLUTE COUNT.: 0.73 x 1000/ÂµL (ref 0.00–1.00)
BKR WAM MONOCYTES: 4.6 % (ref 4.0–12.0)
BKR WAM MPV: 10.6 fL (ref 8.0–12.0)
BKR WAM NEUTROPHILS: 87 % — ABNORMAL HIGH (ref 39.0–72.0)
BKR WAM NUCLEATED RED BLOOD CELLS: 0.1 % (ref 0.0–1.0)
BKR WAM PLATELETS: 276 10*3/uL (ref 150–420)
BKR WAM RDW-CV: 12.6 % (ref 11.0–15.0)
BKR WAM RED BLOOD CELL COUNT.: 4.21 M/ÂµL (ref 4.00–6.00)
BKR WAM WHITE BLOOD CELL COUNT: 15.8 10*3/uL — ABNORMAL HIGH (ref 4.0–11.0)

## 2024-02-18 LAB — GROUP B STREP PCR (PEN NON-ALLERGIC)  (BH GH LMW YH): BKR GROUP B STREP PCR (PEN NON-ALLERGIC): NEGATIVE

## 2024-02-18 MED ORDER — LIDOCAINE-EPINEPHRINE (PF) 1 %-1:200,000 INJECTION SOLUTION
1 | PERINEURAL | Status: DC | PRN
Start: 2024-02-18 — End: 2024-02-18
  Administered 2024-02-18: 18:00:00 1 %-:200,000 via PERINEURAL

## 2024-02-18 MED ORDER — DIMENHYDRINATE 50 MG/ML INJECTION SOLUTION
50 | INTRAVENOUS | Status: DC | PRN
Start: 2024-02-18 — End: 2024-02-18
  Administered 2024-02-18: 18:00:00 50 mg/mL via INTRAVENOUS

## 2024-02-18 MED ORDER — DIMENHYDRINATE 50 MG/ML INJECTION SOLUTION
50 | INTRAMUSCULAR | Status: DC | PRN
Start: 2024-02-18 — End: 2024-02-18

## 2024-02-18 MED ORDER — BUPIVACAINE (PF) 0.75 % (7.5 MG/ML) IN 8.25 % DEXTROSE INJECTION
0.75 | INTRATHECAL | Status: DC | PRN
Start: 2024-02-18 — End: 2024-02-18

## 2024-02-18 MED ORDER — MAGNESIUM SULFATE 2 GRAM/50 ML (4 %) IN WATER INTRAVENOUS PIGGYBACK
2 | INTRAVENOUS | Status: DC | PRN
Start: 2024-02-18 — End: 2024-02-18

## 2024-02-18 MED ORDER — BETAMETHASONE ACETATE AND SODIUM PHOS 6 MG/ML SUSPENSION FOR INJECTION
6 | INTRAMUSCULAR | Status: DC
Start: 2024-02-18 — End: 2024-02-18
  Administered 2024-02-18: 6 mL via INTRAMUSCULAR

## 2024-02-18 MED ORDER — FENTANYL (PF) 50 MCG/ML (WRAPPED ERX) INJECTION
50 | EPIDURAL | Status: DC | PRN
Start: 2024-02-18 — End: 2024-02-18
  Administered 2024-02-18: 18:00:00 50 ug/mL via EPIDURAL

## 2024-02-18 MED ORDER — BETAMETHASONE ACETATE AND SODIUM PHOS 6 MG/ML SUSPENSION FOR INJECTION
6 | INTRAMUSCULAR | Status: CP
Start: 2024-02-18 — End: ?
  Administered 2024-02-19: 05:00:00 6 mL via INTRAMUSCULAR

## 2024-02-18 MED ORDER — CALCIUM GLUCONATE INJECTION
Freq: Once | INTRAVENOUS | Status: DC | PRN
Start: 2024-02-18 — End: 2024-02-19

## 2024-02-18 MED ORDER — FENTANYL (PF) 2 MCG/ML-BUPIVACAINE 0.0625 %-NACL INJECTION SOLUTION
2 | EPIDURAL | 1 refills | Status: DC
Start: 2024-02-18 — End: 2024-02-19

## 2024-02-18 MED ORDER — CLOTRIMAZOLE 1 % VAGINAL CREAM
1 | Freq: Every evening | VAGINAL | Status: DC
Start: 2024-02-18 — End: 2024-02-18

## 2024-02-18 MED ORDER — FENTANYL (PF) 2 MCG/ML-BUPIVACAINE 0.0625 %-NACL INJECTION SOLUTION
2 | EPIDURAL | Status: DC | PRN
Start: 2024-02-18 — End: 2024-02-18
  Administered 2024-02-18: 18:00:00 2 mL/h via EPIDURAL

## 2024-02-18 MED ORDER — CLOTRIMAZOLE 1 % VAGINAL CREAM
1 | Freq: Every evening | VAGINAL | Status: DC
Start: 2024-02-18 — End: 2024-02-19

## 2024-02-18 MED ORDER — DEXAMETHASONE SODIUM PHOSPHATE 4 MG/ML INJECTION SOLUTION
4 | INTRAVENOUS | Status: DC | PRN
Start: 2024-02-18 — End: 2024-02-18
  Administered 2024-02-18: 18:00:00 4 mg/mL via INTRAVENOUS

## 2024-02-18 MED ORDER — LACTATED RINGERS INTRAVENOUS SOLUTION
INTRAVENOUS | Status: DC | PRN
Start: 2024-02-18 — End: 2024-02-18
  Administered 2024-02-18: 17:00:00 via INTRAVENOUS

## 2024-02-18 MED ORDER — MAGNESIUM SULFATE 20 GRAM/500 ML (4 %) IN WATER INTRAVENOUS SOLUTION
20 | INTRAVENOUS | Status: DC
Start: 2024-02-18 — End: 2024-02-19
  Administered 2024-02-18 – 2024-02-19 (×2): 20 mL/h via INTRAVENOUS

## 2024-02-18 MED ORDER — NIFEDIPINE IMMEDIATE RELEASE 10 MG CAPSULE
10 | ORAL | Status: AC
Start: 2024-02-18 — End: ?

## 2024-02-18 MED ORDER — FENTANYL 2 MCG/ML BUPIVICAINE 0.125% IN NS 250 ML EPIDURAL (PYXIS)
2-0.125 | EPIDURAL | Status: DC | PRN
Start: 2024-02-18 — End: 2024-02-24
  Administered 2024-02-18: 19:00:00 2-0.125 mL/h via EPIDURAL

## 2024-02-18 MED ORDER — ONDANSETRON HCL (PF) 4 MG/2 ML INJECTION SOLUTION
4 | Freq: Four times a day (QID) | INTRAVENOUS | Status: DC | PRN
Start: 2024-02-18 — End: 2024-02-19

## 2024-02-18 MED ORDER — MAGNESIUM SULFATE (L&D) BOLUS FROM BAG
Freq: Once | INTRAVENOUS | Status: CP
Start: 2024-02-18 — End: ?
  Administered 2024-02-18: 17:00:00 150.000 mL via INTRAVENOUS

## 2024-02-18 MED ORDER — ONDANSETRON HCL (PF) 4 MG/2 ML INJECTION SOLUTION
4 | INTRAVENOUS | Status: DC | PRN
Start: 2024-02-18 — End: 2024-02-18
  Administered 2024-02-18: 17:00:00 4 via INTRAVENOUS

## 2024-02-18 MED ORDER — FENTANYL (PF) 2 MCG/ML-BUPIVACAINE 0.0625 %-NACL INJECTION SOLUTION
2 | EPIDURAL | Status: DC | PRN
Start: 2024-02-18 — End: 2024-02-18

## 2024-02-18 MED ORDER — MAGNESIUM SULFATE 20 GRAM/500 ML (4 %) IN WATER INTRAVENOUS SOLUTION
20 | Status: CP
Start: 2024-02-18 — End: ?
  Administered 2024-02-18: 17:00:00 20 mL/h via INTRAVENOUS

## 2024-02-18 MED ORDER — FENTANYL (PF) 50 MCG/ML (WRAPPED ERX) INJECTION
50 | INTRATHECAL | Status: DC | PRN
Start: 2024-02-18 — End: 2024-02-18
  Administered 2024-02-18: 18:00:00 50 ug/mL via INTRATHECAL

## 2024-02-18 MED ORDER — FENTANYL (PF) 2 MCG/ML-BUPIVACAINE 0.0625 %-NACL INJECTION SOLUTION
2 | Status: CP
Start: 2024-02-18 — End: ?

## 2024-02-18 MED ORDER — NALBUPHINE 20 MG/ML INJECTION SOLUTION
20 | INTRAVENOUS | Status: DC | PRN
Start: 2024-02-18 — End: 2024-02-19
  Administered 2024-02-18: 19:00:00 20 mL via INTRAVENOUS

## 2024-02-18 MED ORDER — ACETAMINOPHEN 325 MG TABLET
325 | Freq: Four times a day (QID) | ORAL | Status: DC | PRN
Start: 2024-02-18 — End: 2024-02-19

## 2024-02-18 MED ORDER — NALOXONE 0.4 MG/ML INJECTION SOLUTION
0.4 | INTRAVENOUS | Status: DC | PRN
Start: 2024-02-18 — End: 2024-02-19

## 2024-02-18 MED ORDER — BUPIVACAINE (PF) 0.125 % (1.25 MG/ML) INJECTION SOLUTION
0.125 | EPIDURAL | Status: DC | PRN
Start: 2024-02-18 — End: 2024-02-18
  Administered 2024-02-18: 18:00:00 0.125 % (1.25 mg/mL) via EPIDURAL

## 2024-02-18 MED ORDER — BUPIVACAINE (PF) 0.25 % (2.5 MG/ML) INJECTION SOLUTION
0.25 | INTRATHECAL | Status: DC | PRN
Start: 2024-02-18 — End: 2024-02-18
  Administered 2024-02-18: 18:00:00 0.25 % (2.5 mg/mL) via INTRATHECAL

## 2024-02-18 MED ORDER — NIFEDIPINE IMMEDIATE RELEASE 10 MG CAPSULE
10 | Freq: Four times a day (QID) | ORAL | Status: CP
Start: 2024-02-18 — End: ?
  Administered 2024-02-19 – 2024-02-20 (×5): 10 mg via ORAL

## 2024-02-18 NOTE — Plan of Care [1000001]
 Problem: Adult Inpatient Plan of CareGoal: Plan of Care ReviewOutcome: Interventions implemented as appropriateGoal: Patient-Specific Goal (Individualized)Outcome: Interventions implemented as appropriateGoal: Absence of Hospital-Acquired Illness or InjuryOutcome: Interventions implemented as appropriateGoal: Optimal Comfort and WellbeingOutcome: Interventions implemented as appropriateGoal: Readiness for Transition of CareOutcome: Interventions implemented as appropriate Problem: Preterm LaborGoal: Delayed Preterm BirthOutcome: Interventions implemented as appropriate Problem: Hospitalized Perinatal PatientGoal: Optimal Patient and Fetal WellbeingOutcome: Interventions implemented as appropriate Problem: Fall Injury RiskGoal: Absence of Fall and Fall-Related InjuryOutcome: Interventions implemented as appropriate Plan of Care Overview/ Patient Status32 year old G1P0 currently at 31+[redacted] wks gestation admitted for preterm contractions, NRFHT, cerclage in place. See clinical flow sheet for documentation of vital signs, FHR/uterine TOCO monitoring details and head to toe assessment. Pt denies regular or painful contractions, denies leakage of fluid, denies vaginal bleeding. Plan: Continue per IPOC.

## 2024-02-18 NOTE — PACU Transfer of Care [100004]
 Post Anesthesia Transfer of Care NotePatient: Mariah HoodProcedure(s) Performed: Procedure(s) (LRB):CERCLAGE, UTERINE CERVIX, NONOBSTETRICAL (Lower)Last Vitals: I have reviewed the post-operative vital signs during the handoff as noted in the Epic chart.POSTOP HANDOFF :      Patient Location:  Labor & Delivery     [place] Assessment (score of 8 or greater qualifies pt for phase II location):         Activity:  1 - able to move 2 extremities voluntarily or on command        Respiratory:  2 - able to breathe deeply and cough freely        Circulatory:  2 - blood pressure +/- 20% of pre-anesthetic level        Consciousness:  2 - fully awake        O2 Saturation:  2 - able to maintain oxygen saturation > 92% on room air             TOTAL:  9     Level of Consciousness:  Awake     VS stable since last recorded intra-op set? Yes       Oxygen source: room airPatient co-morbidities, intra-operative course, intake & output and antibiotics as per Anesthesia record were discussed with the RN.

## 2024-02-18 NOTE — Consults [2]
 [facility]  Patient Name: Mariah Ferguson Age: [age] DOB: 1996/01/02	 MRN: [id number]	 Neonatology Consult Note - 1/27/2026Date and Time of Consult: 1/27 9:20amAttending physician who requested consultation: Dr. Leverne, CHRISTELLA.D.Reason for consultation: prematurity, severe FGRPresent at consultation: Eliberto Loise Plater, Rollo Crochet (NICU fellow)Maternal History:Patient Active Problem List  Diagnosis Date Noted  Fetal growth restriction antepartum 01/07/2024   22w: Early onset sFGR, genetics [x] , amnio [x]  results Neg CMV Neg, wkly dopplers, Q2w EFW, NST at 32w+11/26: Est. FW: 429 gm 0 lb 15 oz 3.8 % 1/13: Est. FW: 988 gm 2 lb 3 oz < 1 %   Shirodkar cerclage with TWO SUTURES PRESENT 12/18/2023   -Two Shirodkar cerclages placed 11/26, both knots at 12 o'clock-Would likely benefit from removal in OR w spinal  Short cervical length during pregnancy in second trimester 11/27/2023   FAS 11/5 at [redacted]w[redacted]d: Complete anatomy visualized and unremarkable  Short cervix (2.49 cm) without dilation Recommendations: Start nightly vaginal progesterone  200mg . Return for follow-up cervical length in one week. Regardless of cervical length next week, also recommend follow up for fetal growth in 3 weeks given borderline measurements today.Rx sent 11/5Rpt CL: 11/21 1.71cm, 11/26 1.2cm, internal os 2cm -> cerclage placed  Supervision of normal first pregnancy, antepartum 09/09/2023   Pregnancy Problem List:      [X]  IPV folder  [X]  20w folder  [x]  28w folder1T: managing nausea with MJHx PCOS, hx ovarian cyst with emergent surgery r/t concern for torsion, hx hydrosalpinx with multiple admissionsHx pre-DM A1C (5.9) but 5.2 in 1TStarting BMI 17Presumptive UTI 15w, Macrobid  Rx'd - didn't complete, TOC NegShortened CL: Shirodkar cerclage placed 11/26/25Suspected sFGR 23wDating:			10/04/23 12+0, c/w LMP datingConsults:		MFM for cerclage, Genetics for early onset FGRLDA?:			NoAnatomy:		NL anatomy, anterior placenta, measurements sl lagging, but not diagnostic - plan f/u growth in 3 wks; *shortened cervix at 2.49cm --> PV Prog, rpt CL in 1 wk	Rpt CL 1wk:	1.71cm. Pt cancelled 12/23 and 12//30 scan	29w scan:	Est. FW: 988 gm 2 lb 3 oz < 1 % normal Dopplers	Growth 3wk	[ ]  1/20/26Rhogam:		Not indicatedBlood products?	AcceptsFlu vax:			ReceivedCOVID vax:		DeclinesThird tri mood:TDap:RSV:			NADelivery timing:Pedi:Childbirth Zi:Ojanm pref:Consents?		NAInfant feeding:Position:Circ?GBS/date:	2nd GBS:NST/MVP:		Early dopplers given sFGR, NST 32+CNMs:LS [x] 	EH [X] 	KN [x] 	HM [ ] 	SC [X] 	SD [ ]  met SGS1st Trimester OB labs: 10/1/25Type & screen:	A+, Ab screen negHgb electro:	AAH&H:		12.9/38.8	MCV:		95.3Platelets:	252Rubella:	Imm	HIV:		Neg	RPR:		NR	Hep B sAg:	NegHep C:		NegHgbA1c:	5.2Urine culture:	<10k mixedPap:		05/2023 pHPV NegGC/Francis:		8/18: neg/neg			CffDNA:	WNLCarrier testing:	Negative3rd Trimester OB Labs: 12/11/25H&H:		12.3/35.9	Platelets:	290	RPR:		Non-reactiveHIV:		negative1hr GCT:	128  Left ovarian cyst 04/01/2022   02/2022 emergent ovarian cystectomy r/t concern for torsion  Pre-diabetes 01/24/2022   12/2021 HgbA1C 5.9%Reviewed lifestyle changesRepeat 54m, ordered, not done10/1/25 5.2%   Hydrosalpinx 01/17/2022   Left, inpt admission 06/2021  Right ovarian cyst 01/17/2022   01/17/22 ED US : right adnexa demonstrates 5.9 x 5.2 x 4.8 cm multiloculated cystic structure that may represent a combination of hemorrhagic ovarian cysts versus a hemorrhagic ovarian cyst and hematosalpinx.Rpt US  in 1 month ordered: done in ED 2/10 when presented with pain: Previously seen likely right hemorrhagic cyst is now significantly decreased in size, measuring 1.4 x 1.4 x 1.4 cm  PCOS (polycystic ovarian syndrome) 01/17/2022   Multiple follicles seen on USMinastrin Rx'd 01/2022 Maternal Medications:Current Facility-Administered Medications Medication Dose Route Frequency Provider Last Rate Last Admin  betamethasone  acetate-betamethasone  sodium phosphate  (CELESTONE ) injection 12 mg  12 mg Intramuscular Q24H Stennett, Krystal Latoya, MD   12 mg at 02/18/24 0020  clotrimazole  (GYNE-LOTRIMIN ) 1 % vaginal cream 1 Applicatorful  1 Applicatorful Vaginal Nightly Upadhyay, Animesh, MBBS      prenatal vitamin 1 tablet  1 tablet Oral Daily [place], Maria, MD  1 tablet at 02/18/24 0949  sodium chloride  0.9 % flush 3 mL  3 mL Intravenous Q8H Leotis Sprung, Hadassah, MD   3 mL at 02/18/24 0949 acetaminophen , chlorhexidine  gluconate, ondansetron  (ZOFRAN ) IV Push, ondansetron , sodium chlorideAssessment:29 y.o. G1P0000 @ [redacted]w[redacted]d EGA, admitted for monitoring iso AFT, severe FGR (EFW 988g <1%tile on 1/13). Expecting baby boy, [name].  Based on the favorable prognosis for survival without severe neurodevelopmental impairment, the NNICU team will provide full resuscitation in the delivery room. Summary of counseling provided to family:I met with this patient to discuss the potential morbidities and mortality associated with an infant being delivered at 31-[redacted]week gestation.We reviewed the potential problems that an infant born at this gestation might face at the time of birth and during a NNICU course. We discussed the potential need for respiratory support at birth, which likely include noninvasive support but may also include intubation and mechanical ventilation, surfactant administration for respiratory distress syndrome. We reviewed the increased risk of neurologic complications including IVH and the role of cranial ultrasound. We discussed the possibility of long term neurodevelopmental impairment. We discussed that the developmental outcomes will be based on close follow-up and will be significantly impacted by any major complications during the hospitalization. We discussed the increased risk of infection in preterm infants and the need for empiric antibiotics at birth in some cases.  We discussed feeding difficulties and the need for enteral/parenteral nutritional support as well as the potential need for central access including UAC/UVC lines. The patient is interested in providing breast milk for her infant and we also discussed the possibility of donor BM. We discussed the importance of the parent's own milk for the premature infant. We described the available resources to assist with providing breast milk for the infant.  The patient does assent to the use of donor BM. We discussed the NNICU visitation policy as well as support services, including lactation, social work and parent art gallery manager.  I described the NNICU team and our role both in the DR at the time of delivery as well as throughout the infant's course in the NNICU. We discussed the NNICU visitation policy as well as support services, including lactation, social work, parent art gallery manager, child life, and spiritual support.All of the patient's questions were answered at the time of this initial consult. Mariah Ferguson seemed to understand the potential risks involved with an infant born at this gestational age as well as the care plan to be initiated at the time of delivery. Patient made aware that Neonatology staff will be present for delivery and that we remain available for any questions or concerns that might ariseKathleen G Zaiyden Strozier, MD1/27/20269:58 AM

## 2024-02-18 NOTE — Anesthesia Procedure Notes [28]
 Room and Bed: 463 , 463-A  Current Location: 463-A Neuraxial Block:Date/Time: 02/18/2024 6:26 PM epidural and Combined CSE and intrathecal and CSEPain Diagnosis/Location: Cerclage removalThis block procedure is being performed for post-operative pain management at the request of:       Requesting Physician: Katheryne Leita BROCKS, CNMPre-procedure Checklistpatient identified; site marked; surgical consent reviewd; pre-op evaluation performed; Universal Protocol/timeout performed; IV checked; monitors and equipment checked; informed consent obtained and options, plan, risks & benefits discussed with patientUniversal ProtocolUniversal protocol documented by nurse: yesPre-op Anti-coagulation Ldz:WnwzAjdzopwz Neurological Deficits:NonePatient's pre-procedure mental status:  AwakePrep: chloraprep and patient drapedNeedle A:  epidural and Combined CSE Laterality : levelLevel: L3-4Injection technique: continuousNeedle: Tuohy      Gauge: 17 G      Length: 3.5 inCatheter at skin depth: 10 cmTechnique: Landmark technique - Number of attempts: 2- Was this a replacement catheter for a dysfunctional catheter? NoEvent(s): blood not aspirated, injection not painful, no injection resistance, no cerebrospinal fluid, no paresthesia and no other eventPerformed By:     - Anesthesiologist: Tex Olita Blunt, MD, with Attending Anesthesiologist and with resident      - Resident: Broadus Debby Auerbach', MDPatient's position for procedure: sittingLaterality: N/ANeedle B: intrathecal and CSE Laterality: levelInjection technique: single-shotNeedle: Pencan      Gauge: 25 G      Length: 5 inTechnique: Loss of resistance     - Loss of resistance to: saline     - Depth loss of resistance obtained: 5 cm - Number of attempts: 1Event(s): no blood aspirated, no injection painful, no injection resistance, cerebrospinal fluid, no paresthesia and no other eventPerformed by:      - Anesthesiologist: Tex Olita Blunt, MD, with Attending Anesthesiologist and with resident      - Resident: Broadus Debby Auerbach', MDNew laterality for needle/catheter 2: noNew location for needle/catheter 2: noNew patient's position for procedure:NoLaterality:  N/APatient's position for procedure:sittingOutcome:  successful blockAttempts:  block completePatient tolerated block procedure well?:  yesAdditional Notes:2 attempts at same level- required a few redirections

## 2024-02-18 NOTE — Plan of Care [1000001]
 Problem: Adult Inpatient Plan of CareGoal: Readiness for Transition of CareOutcome: Interventions implemented as appropriateCase Management Screening  Flowsheet Row Most Recent Value Case Management Screening: Chart review completed. If YES to any question below then proceed to CM Eval/Plan  Is there a change in their cognitive function No Do you anticipate that the patient will have any discharge needs requiring CM/SW intervention? No Has there been an unscheduled readmission within the past 30 days and/or >four (4) admissions within 12 months? No Were there services prior to admission ( Examples: Acute [facility],  Assisted Living, HD, Homecare, Extended Care Facility, Methadone, SNF, Outpatient Infusion Center) No Negative/Positive Screen Negative Screening: Case Management department will follow patient's progress and discuss plan of care with treatment team. Case Manager/Social Work Attestation  I have reviewed the medical record and completed the above screen. CM/SW staff will follow patient's progress and discuss the plan of care with the Treatment Team. Yes  Plan of Care Overview/ Patient Mariah Ferguson is a 29 y.o. G1P0000 @ [redacted]w[redacted]d EGA, admitted for prolonged monitor iso AFT on admission. Betamethasone , NNICU consult, growth scan today along with dopplers iso sFGR. Continue inpatient management. CM will follow for discharge needs.Grayce Butts BSN, RNMaternity and Pediatric Care [phone]

## 2024-02-18 NOTE — Anesthesia Preprocedure Evaluation [24]
 This is a 29 y.o. female scheduled for CERCLAGE, UTERINE CERVIX, NONOBSTETRICAL (Lower).Review of Systems/ Medical History Patient summary, nursing notes, EKG/Cardiac Studies , Labs, pre-procedure vitals, height, weight and NPO status reviewed.No previous anesthesia concernsAnesthesia Evaluation:   No history of anesthetic complications  Estimated body mass index.02/17/24 : 20.24 kg/m? Last patient weight recorded. 02/17/24 : 47 kg Last patient height recorded. 02/17/24 : 5' (1.524 m) CC/HPI: 29 y.o. G1P0000 @ [redacted]w[redacted]d EGA, admitted for prolonged monitor iso AFT on admission. Now having contractions with cerclage in place- plan for cerclage removal in the OR. Past Surgical History:  Lap cystectomyCardiovascular: Negative      -Exercise tolerance: >4 METS -Vascular Disease:  Negative    Respiratory:  Negative.HEENT: Negative.Neuromuscular: Negative-Comments: 2023 experienced diplopia and bilateral LE weakness in the setting of an argument with her boyfriend. [place] head, CTA head/neck without structural etiology to explain symptoms.Skeletal/Skin:  NegativeGastrointestinal/Genitourinary: Nutritional Disorders: Pt is cachectic per BMI definition, (BMI <20).Behavioral/Social/Psychiatric & Syndromes: NegativePhysical ExamCardiovascular:      Rhythm: regularHeart Sounds: S1 present and S2 present.Reason: Comments: Pulmonary:    Patient's breath sounds clear to auscultationReason: Comments: Airway:  Mallampati: IITM distance: >3 FBNeck ROM: fullMouth Opening: >3cmReason: Comments: Dental:  Documentation is limited to gross visual examination or patient report and does not reflect any prior dental records or associated imaging. Omissions in documentation do not reflect absence of pathology. unremarkable  Anesthesia PlanASA 2 The primary anesthesia plan is  spinal. CSEPerioperative Code Status confirmed: It is my understanding that the patient is currently designated as 'Full Code' and will remain so throughout the perioperative period.Anesthesia informed consent obtained.  Consent obtained from: patientType of Anesthesia informed consent obtained:  E-consentUse of blood products: consented  Plan discussed with Resident and Attending.Anesthesiologist's Pre Op NoteI personally evaluated and examined the patient prior to the intra-operative phase of care on the day of the procedure.SABRA

## 2024-02-18 NOTE — Progress Notes [1]
 MFM update:Reviewed plan of care for [facility]. Overall stable but with sporadic decelerations overnight, increasing in frequency from admission. Otherwise stable without evidence of preterm labor, abruption, or other acute OB etiology.  Given frequency of decelerations, will begin BMZ for fetal lung maturity. If delivery felt to be imminent, eligible for magnesium  for fetal neuroprotection. [facility] provider, [name], in agreement with planRachel D. Conrad, MDMaternal-Fetal Medicine [facility] Obstetrics & Gynecology

## 2024-02-18 NOTE — Progress Notes [1]
 Antepartum Progress NotePatient Info: Mariah Ferguson 29 y.o. female G1P0000 currently at [redacted]w[redacted]d admitted for prolonged monitoring iso AFT.Subjective: Interval Events:-Intermittent prolonged decelerations over night -> s/p BMZ#1 0020This AM, feels well. Denies regular ctx, LOF, VB, or dFM. Review of Systems:As aboveObjective: Vital Signs:Patient Vitals for the past 24 hrs: BP Temp Temp src Pulse Resp SpO2 Height Weight 02/18/24 0607 126/64 97.7 ?F (36.5 ?C) Temporal 76 17 -- -- -- 02/18/24 0601 -- -- -- -- -- 100 % -- -- 02/18/24 0223 107/71 97.6 ?F (36.4 ?C) Temporal 83 16 100 % -- -- 02/17/24 2211 111/60 97.8 ?F (36.6 ?C) Temporal 68 15 -- -- -- 02/17/24 1809 119/76 97.9 ?F (36.6 ?C) Temporal 62 16 99 % -- -- 02/17/24 1410 124/77 97.9 ?F (36.6 ?C) Temporal 67 16 99 % 5' (1.524 m) 47 kg 02/17/24 0730 100/66 98.4 ?F (36.9 ?C) Temporal 72 16 100 % -- -- Intake/Output:No intake or output data in the 24 hours ending 02/18/24 0620Physical Exam:GEN: NAD, resting in bedABD: Gravid, soft, nontender tp at fundusEXT: WWP, no calf tendernessSVE/SSE: DeferredLabor Monitoring:Frequency: contFHM: 135/minimal to moderate  var /+accels /+prolonged decels to 1-2 min, nadir to 110s	TOCO: 0-1/10minsReview of LabsResults in Past 7 DaysResult Component Current Result Hematocrit 43.10 (02/17/2024) Hemoglobin 14.6 (02/17/2024) MCH 30.9 (02/17/2024) MCHC 33.9 (02/17/2024) MCV 91.3 (02/17/2024) MPV 10.5 (02/17/2024) Platelets 253 (02/17/2024) RBC 4.72 (02/17/2024) WBC 13.5 (H) (02/17/2024)   Chemistry  Lab Results Component Value Date  NA 137 09/08/2023  K 4.1 09/08/2023  CL 105 09/08/2023  CO2 22 09/08/2023  BUN 11 09/08/2023  CREATININE 0.50 09/08/2023  GLU 83 09/08/2023  Lab Results Component Value Date  CALCIUM  9.4 09/08/2023  ALKPHOS 67 12/27/2022  AST 22 12/27/2022  ALT 9 (L) 12/27/2022  BILITOT 0.2 12/27/2022  Glucose Date Value Ref Range Status 09/08/2023 83 70 - 100 mg/dL Final 95/83/7974 97 70 - 100 mg/dL Final 97/92/7974 83 70 - 100 mg/dL Final POC Glucose Date Value Ref Range Status 05/08/2023 99 70 - 100 mg/dL Final  Assessment and Plan: 28 y.o. G1P0000 @ [redacted]w[redacted]d EGA, admitted for prolonged monitor iso AFT on admission. Based on the change of fetal status overnight, she received her first dose of steroids, however as there has been no concerns of labor or imminent delivery, we continue to defer Mag in her case. Low threshold to perform cervical exam, considering she has two Shirodkar cerclages. We plan to perform a growth scan today along with dopplers iso sFGR. Plan as below. AFT[] 1/27 BPPR/o labour-0650 visually closed, cerclage not on tension-0958 closed -GCT neg, UA neg, BV neg-candida Pos -> clotrimazole  gel nightly Severe FGR -22w: early onset sFGR, genetics [x] , amnio [x] ; results Neg CMV Neg, wkly Dopplers, Q2w EFW, NST at 32w+-1/13: EFW 988g; < 1 % US -indicated Shirodkar cerclage-2 Shirodkar cerclages placed 11/26, both knots @ 12 o'clockS/p emergent ovarian cystectomy -2024--------------------------Mariah Ferguson MBBSPGY-2 Obstetrics, Gynecology and Reproductive Sciences [facility]

## 2024-02-18 NOTE — Plan of Care [1000001]
 Plan of Care Overview/ Patient StatusPt is G1P0 @ 31+5 admitted for prolonged monitoring iso AFT and uterine contractions. Cerclage in place - no tension visualized or palpated. GBS pending. Beta #1 administered 1/27 @ 0020. Pt reports intermittent contractions. Denies LOF and VB. Pt is ambulating the bathroom independently. EFM continuous.

## 2024-02-18 NOTE — Anesthesia Procedure/Diagnosis Confirmation Note [[id number]]
 Operative Diagnosis:Pre-op:   * No pre-op diagnosis entered * Patient Coded Diagnosis   None  Patient Diagnosis   None    * No Diagnosis Codes entered *Operative Procedure(s) :Procedure(s) (LRB):CERCLAGE, UTERINE CERVIX, NONOBSTETRICAL (Lower)Post-op Procedure & Diagnosis ConfirmationPost-op Diagnosis: Post-op Diagnosis updated (see notes)     - Cerclage removalPost-op Procedure: Post-op Procedure updated (see notes)     - Cerclage removal

## 2024-02-18 NOTE — Anesthesia Postprocedure Evaluation [25]
 Anesthesia Post-op NotePatient: Mariah HoodProcedure(s):  Procedure(s) (LRB):CERCLAGE, UTERINE CERVIX, NONOBSTETRICAL (Lower) Last Vitals:  I have reviewed the post-operative vital signs as noted in the Epic chart.POSTOP EVALUATION:      Patient Recovery Location:  Labor & Delivery     Vital Signs Status:  Stable     Mental Status:  Awake     Respiratory Status:  Acceptable     Airway Patency:  Patent     Cardiovascular/Hydration Status:  Stable     Pain Management:  Satisfactory to patient     Nausea/Vomiting Status:  Satisfactory to patientNo notable events documented.

## 2024-02-18 NOTE — Progress Notes [1]
 CNM PROGRESS NOTERoye is a 28yo G1P0 at [redacted]w[redacted]d with sFGR, possibly entering PTL I was in to see Mariah Ferguson this morning when she was on [facility] and again this evening on L&B after cerclage removal in OR. At time of my visit, she was comfortable and conversant, though sleepy from magnesium .BPs normal to mild range:Temp:  [97.6 ?F (36.4 ?C)-98 ?F (36.7 ?C)] 97.7 ?F (36.5 ?C)Pulse:  [68-96] 96Resp:  [15-24] 16BP: (107-139)/(60-95) 139/95SpO2:  [97 %-100 %] 97 %Device: room airPt status reviewed with MFM team - plan is for co-management at this point. WHA CNM will remain will remain in house tonight and participate in any appropriate aspect of pt care, including labor and birth, should she progress.Ok for epidural d/cPEC labs ordered.Care of pt signed over to on-coming CNM, Mariah Ferguson.Mariah Ferguson, CNM1/27/20268:46 PM

## 2024-02-18 NOTE — Anesthesia Preprocedure Evaluation [24]
 This is a 29 y.o. female scheduled for LABOR CONSULT.Review of Systems/ Medical History Patient summary, nursing notes, EKG/Cardiac Studies , Labs, pre-procedure vitals, height, weight and NPO status reviewed.No previous anesthesia concernsAnesthesia Evaluation:   No history of anesthetic complications  Estimated body mass index.02/17/24 : 20.24 kg/m? Last patient weight recorded. 02/17/24 : 47 kg Last patient height recorded. 02/17/24 : 5' (1.524 m) CC/HPI: 28 y.o. G1P0000 @ [redacted]w[redacted]d EGA, admitted for prolonged monitor iso AFT on admission. Now having contractions with cerclage in place- plan for cerclage removal in the OR. Past Surgical History:  Lap cystectomyCardiovascular: Negative      -Exercise tolerance: >4 METS -Vascular Disease:  Negative    Respiratory:  Negative.HEENT: Negative.Neuromuscular: Negative-Comments: 2023 experienced diplopia and bilateral LE weakness in the setting of an argument with her boyfriend. [place] head, CTA head/neck without structural etiology to explain symptoms.Skeletal/Skin:  NegativeGastrointestinal/Genitourinary: Nutritional Disorders: Pt is cachectic per BMI definition, (BMI <20).Behavioral/Social/Psychiatric & Syndromes: NegativePhysical ExamDental:  Documentation is limited to gross visual examination or patient report and does not reflect any prior dental records or associated imaging. Omissions in documentation do not reflect absence of pathology. Anesthesia Plan

## 2024-02-19 LAB — CBC WITH AUTO DIFFERENTIAL
BKR WAM ABSOLUTE IMMATURE GRANULOCYTES.: 0.1 x 1000/ÂµL (ref 0.00–0.30)
BKR WAM ABSOLUTE LYMPHOCYTE COUNT.: 1.19 x 1000/ÂµL (ref 0.60–3.70)
BKR WAM ABSOLUTE NRBC: 0 x 1000/ÂµL (ref 0.00–1.00)
BKR WAM ANC (ABSOLUTE NEUTROPHIL COUNT): 15.46 x 1000/ÂµL — ABNORMAL HIGH (ref 2.00–7.60)
BKR WAM BASOPHIL ABSOLUTE COUNT.: 0.01 x 1000/ÂµL (ref 0.00–1.00)
BKR WAM BASOPHILS: 0.1 % (ref 0.0–1.4)
BKR WAM EOSINOPHIL ABSOLUTE COUNT.: 0 x 1000/ÂµL (ref 0.00–1.00)
BKR WAM EOSINOPHILS: 0 % (ref 0.0–5.0)
BKR WAM HEMATOCRIT: 39.3 % (ref 35.00–45.00)
BKR WAM HEMOGLOBIN: 13.7 g/dL (ref 11.7–15.5)
BKR WAM IMMATURE GRANULOCYTES: 0.6 % (ref 0.0–1.0)
BKR WAM LYMPHOCYTES: 6.9 % — ABNORMAL LOW (ref 17.0–50.0)
BKR WAM MCH: 31.6 pg (ref 27.0–33.0)
BKR WAM MCHC: 34.9 g/dL (ref 31.0–36.0)
BKR WAM MCV: 90.6 fL (ref 80.0–100.0)
BKR WAM MONOCYTE ABSOLUTE COUNT.: 0.41 x 1000/ÂµL (ref 0.00–1.00)
BKR WAM MONOCYTES: 2.4 % — ABNORMAL LOW (ref 4.0–12.0)
BKR WAM MPV: 10.3 fL (ref 8.0–12.0)
BKR WAM NEUTROPHILS: 90 % — ABNORMAL HIGH (ref 39.0–72.0)
BKR WAM NUCLEATED RED BLOOD CELLS: 0 % (ref 0.0–1.0)
BKR WAM PLATELETS: 281 10*3/uL (ref 150–420)
BKR WAM RDW-CV: 12.5 % (ref 11.0–15.0)
BKR WAM RED BLOOD CELL COUNT.: 4.34 M/ÂµL (ref 4.00–6.00)
BKR WAM WHITE BLOOD CELL COUNT: 17.2 10*3/uL — ABNORMAL HIGH (ref 4.0–11.0)

## 2024-02-19 LAB — COMPREHENSIVE METABOLIC PANEL
BKR A/G RATIO: 1.1 (ref 1.0–2.2)
BKR ALANINE AMINOTRANSFERASE (ALT): 16 U/L (ref <=35)
BKR ALBUMIN: 3.6 g/dL (ref 3.6–5.1)
BKR ALKALINE PHOSPHATASE: 176 U/L — ABNORMAL HIGH (ref 35–104)
BKR ANION GAP: 14 (ref 7–17)
BKR ASPARTATE AMINOTRANSFERASE (AST): 29 U/L (ref <35)
BKR AST/ALT RATIO: 1.8
BKR BILIRUBIN TOTAL: 0.2 mg/dL (ref <=1.2)
BKR BLOOD UREA NITROGEN: 10 mg/dL (ref 6–20)
BKR BUN / CREAT RATIO: 14.5 (ref 8.0–23.0)
BKR CALCIUM: 7.3 mg/dL — ABNORMAL LOW (ref 8.8–10.2)
BKR CHLORIDE: 99 mmol/L (ref 98–107)
BKR CO2: 20 mmol/L (ref 20–30)
BKR CREATININE DELTA: 0.07
BKR CREATININE: 0.69 mg/dL (ref 0.51–0.95)
BKR EGFR, CREATININE (CKD-EPI 2021): >60 mL/min/{1.73_m2} (ref >=60)
BKR GLOBULIN: 3.4 g/dL (ref 1.9–3.9)
BKR GLUCOSE: 114 mg/dL (ref 70–140)
BKR POTASSIUM: 4.5 mmol/L (ref 3.3–5.5)
BKR PROTEIN TOTAL: 7 g/dL (ref 5.9–8.3)
BKR SODIUM: 133 mmol/L — ABNORMAL LOW (ref 136–145)

## 2024-02-19 LAB — PROTEIN, TOTAL, URINE RANDOM WITH CREATININE    (BH GH LMW YH)
BKR CREATININE, URINE, RANDOM: 115 mg/dL
BKR PROTEIN URINE RANDOM MG/DL: 27.1 mg/dL
BKR PROTEIN/CREATININE RATIO, URINE, RANDOM: 0.2 mg/mg{creat} — ABNORMAL HIGH (ref <0.1)

## 2024-02-19 MED ORDER — DIPHTH,PERTUSSIS(ACEL),TETANUS 2.5 LF UNIT-8 MCG-5 LF/0.5ML IM SYRINGE
2.5-8-5 | INTRAMUSCULAR | Status: DC
Start: 2024-02-19 — End: 2024-02-24

## 2024-02-19 MED ORDER — CLOTRIMAZOLE 1 % VAGINAL CREAM
1 | Freq: Every evening | VAGINAL | Status: DC
Start: 2024-02-19 — End: 2024-02-25
  Administered 2024-02-23: 02:00:00 1 % via VAGINAL

## 2024-02-19 MED ORDER — DIPHENHYDRAMINE 25 MG ORAL TAB/CAP (WRAPPED ERX)
25 | Freq: Once | ORAL | Status: CP | PRN
Start: 2024-02-19 — End: ?
  Administered 2024-02-19: 05:00:00 25 mg via ORAL

## 2024-02-19 MED ORDER — NIFEDIPINE ER 30 MG TABLET,EXTENDED RELEASE 24 HR
30 | Freq: Every day | ORAL | Status: CP
Start: 2024-02-19 — End: ?
  Administered 2024-02-20 – 2024-02-26 (×7): 30 mg via ORAL

## 2024-02-19 NOTE — Plan of Care [1000001]
 Problem: Adult Inpatient Plan of CareGoal: Plan of Care ReviewOutcome: Interventions implemented as appropriateGoal: Patient-Specific Goal (Individualized)Outcome: Interventions implemented as appropriateGoal: Absence of Hospital-Acquired Illness or InjuryOutcome: Interventions implemented as appropriateGoal: Optimal Comfort and WellbeingOutcome: Interventions implemented as appropriateGoal: Readiness for Transition of CareOutcome: Interventions implemented as appropriate Problem: Preterm LaborGoal: Delayed Preterm BirthOutcome: Interventions implemented as appropriate Problem: Hospitalized Perinatal PatientGoal: Optimal Patient and Fetal WellbeingOutcome: Interventions implemented as appropriate Problem: Fall Injury RiskGoal: Absence of Fall and Fall-Related InjuryOutcome: Interventions implemented as appropriate Plan of Care Overview/ Patient Status41 year old G1P0 currently at 31+[redacted] wks gestation readmitted to [facility] from L&B at approximately 9 am S/P IV Magnesium  sulfate infusion with diagnosis tPTL, cervical insufficiency, FGR, new diagnosis gestational HTN.See clinical flow sheet for documentation of vital signs, FHR/uterine TOCO monitoring details and head to toe assessment. Pt denies feeling contractions today. Denies leakage of fluid, denies vaginal bleeding. Denies headache, denies epigastric pain, denies visual disturbances.Pt tolerating regular diet well. Plan: Continue per IPOC.

## 2024-02-19 NOTE — Progress Notes [1]
 [facility]	OB Antepartum Progress NotePatient Data:  Patient Name: Mariah Ferguson Age: 29 y.o. DOB: 04/29/1995	 MRN: [id number]	 Interim:28 yo G1P0 @[redacted]w[redacted]d  admited iso of AFT with severe fetal growth restriction, now with threatened PTL on nifed for tocolysis, and newly diagnosed gHTN.Subjective: Feeling well. One contraction 15 minutes ago, but feels comfortable. +FM. -LOF, ctx. Denies HA, blurry vision, cp, sob, palpitations, and RUQ pain. Objective: Vital Signs:Vitals:  02/19/24 0026 02/19/24 0530 02/19/24 0608 02/19/24 0908 BP: 132/87 (!) 146/93 (!) 109/56 122/78 Pulse: 86 73 (!) 95 90 Resp:  16  18 Temp:  98.1 ?F (36.7 ?C)  98.4 ?F (36.9 ?C) TempSrc:  Tympanic  Temporal SpO2:     Weight:     Height:     I/O:Gross Totals (Last 24 hours) at 02/19/2024 1027Last data filed at 02/19/2024 0830Intake 598.18 ml Output 1400 ml Net -801.82 ml Physical Exam:General: NAD, -CV: RRR-Pulm: nlWOB-Abd: gravid, soft nontender -Ext: WWP, 2+ reflexes Vaginal/Cervical Exam: last 1 cm after cerclage removal.EFM: 125 bpm/mod variability /+accels/- decelsToco: rare ctx/ 10 min averaged over 20Review of LabsLast 24 hours: Recent Results (from the past 24 hours) Comprehensive metabolic panel  Collection Time: 02/18/24  7:59 PM Result Value Ref Range  Sodium 134 (L) 136 - 145 mmol/L  Potassium 4.3 3.3 - 5.5 mmol/L  Chloride 101 98 - 107 mmol/L  CO2 17 (L) 20 - 30 mmol/L  Anion Gap 16 7 - 17  Glucose 94 70 - 140 mg/dL  BUN 10 6 - 20 mg/dL  Creatinine 9.37 9.48 - 0.95 mg/dL  Calcium  8.0 (L) 8.8 - 10.2 mg/dL  BUN/Creatinine Ratio 83.8 8.0 - 23.0  Total Protein 6.7 5.9 - 8.3 g/dL  Albumin 3.4 (L) 3.6 - 5.1 g/dL  Total Bilirubin 0.2 <=8.7 mg/dL  Alkaline Phosphatase 835 (H) 35 - 104 U/L  Alanine Aminotransferase (ALT) 16 <=35 U/L  Aspartate Aminotransferase (AST) 30 <35 U/L  Globulin 3.3 1.9 - 3.9 g/dL  A/G Ratio 1.0 1.0 - 2.2  AST/ALT Ratio 1.9 Reference Range Not Established  eGFR (Creatinine) >60 >=60 mL/min/1.8m2  Creatinine Delta 0.12 See Comment Protein, total, urine random with creatinine    (BH GH LMW YH)  Collection Time: 02/18/24  7:59 PM Result Value Ref Range  Creatinine, Urine, Random 96 Reference Range Not Established mg/dL  Protein/Creatinine Ratio, Urine, Random 0.2 (H) <0.1 mg/mg Cr  Protein Urine Random 18.3 Reference range is not established mg/dL Urinalysis-macroscopic w/reflex microscopic  Collection Time: 02/18/24  7:59 PM Result Value Ref Range  Clarity, UA Cloudy (A) Clear  Color, UA Yellow Yellow, Colorless  Specific Gravity, UA 1.035 (H) 1.005 - 1.030  pH, UA 6.0 5.5 - 7.5  Protein, UA 1+ (A) Negative, Trace  Glucose, UA Negative Negative  Ketones, UA Negative Negative  Blood, UA 1+ (A) Negative  Bilirubin, UA Negative Negative  Leukocytes, UA Negative Negative  Nitrite, UA Negative Negative  Urobilinogen, UA <2.0 <=2.0 mg/dL CBC auto differential  Collection Time: 02/18/24  7:59 PM Result Value Ref Range  WBC 15.8 (H) 4.0 - 11.0 x1000/?L  RBC 4.21 4.00 - 6.00 M/?L  Hemoglobin 12.8 11.7 - 15.5 g/dL  Hematocrit 61.79 64.99 - 45.00 %  MCV 90.7 80.0 - 100.0 fL  MCH 30.4 27.0 - 33.0 pg  MCHC 33.5 31.0 - 36.0 g/dL  RDW-CV 87.3 88.9 - 84.9 %  Platelets 276 150 - 420 x1000/?L  MPV 10.6 8.0 - 12.0 fL  Neutrophils 87.0 (H) 39.0 - 72.0 %  Lymphocytes  7.9 (L) 17.0 - 50.0 %  Monocytes 4.6 4.0 - 12.0 %  Eosinophils 0.0 0.0 - 5.0 %  Basophil 0.1 0.0 - 1.4 %  Immature Granulocytes 0.4 0.0 - 1.0 %  nRBC 0.1 0.0 - 1.0 %  Absolute Lymphocyte Count 1.25 0.60 - 3.70 x 1000/?L  Monocyte Absolute Count 0.73 0.00 - 1.00 x 1000/?L  Eosinophil Absolute Count 0.00 0.00 - 1.00 x 1000/?L  Basophil Absolute Count 0.02 0.00 - 1.00 x 1000/?L  Absolute Immature Granulocyte Count 0.07 0.00 - 0.30 x 1000/?L  Absolute nRBC 0.02 0.00 - 1.00 x 1000/?L  ANC (Abs Neutrophil Count) 13.75 (H) 2.00 - 7.60 x 1000/?L Urine microscopic     (BH GH LMW YH)  Collection Time: 02/18/24  7:59 PM Result Value Ref Range  RBC/HPF, UA 3 (H) 0 - 2 /HPF  WBC/HPF, UA 3 0 - 5 /HPF  Bacteria, UA Rare None-Rare /HPF  Urine Squamous Epithelial Cells, UA 2 0 - 5 /HPF  Hyaline Casts, UA 4 (H) 0 - 3 /LPF Protein, total, urine random with creatinine    (BH GH LMW YH)  Collection Time: 02/19/24  4:35 AM Result Value Ref Range  Creatinine, Urine, Random 115 Reference Range Not Established mg/dL  Protein/Creatinine Ratio, Urine, Random 0.2 (H) <0.1 mg/mg Cr  Protein Urine Random 27.1 Reference range is not established mg/dL Comprehensive metabolic panel  Collection Time: 02/19/24  4:35 AM Result Value Ref Range  Sodium 133 (L) 136 - 145 mmol/L  Potassium 4.5 3.3 - 5.5 mmol/L  Chloride 99 98 - 107 mmol/L  CO2 20 20 - 30 mmol/L  Anion Gap 14 7 - 17  Glucose 114 70 - 140 mg/dL  BUN 10 6 - 20 mg/dL  Creatinine 9.30 9.48 - 0.95 mg/dL  Calcium  7.3 (L) 8.8 - 10.2 mg/dL  BUN/Creatinine Ratio 85.4 8.0 - 23.0  Total Protein 7.0 5.9 - 8.3 g/dL  Albumin 3.6 3.6 - 5.1 g/dL  Total Bilirubin 0.2 <=8.7 mg/dL  Alkaline Phosphatase 823 (H) 35 - 104 U/L  Alanine Aminotransferase (ALT) 16 <=35 U/L  Aspartate Aminotransferase (AST) 29 <35 U/L  Globulin 3.4 1.9 - 3.9 g/dL  A/G Ratio 1.1 1.0 - 2.2  AST/ALT Ratio 1.8 Reference Range Not Established  eGFR (Creatinine) >60 >=60 mL/min/1.69m2  Creatinine Delta 0.07 See Comment CBC auto differential  Collection Time: 02/19/24  4:35 AM Result Value Ref Range  WBC 17.2 (H) 4.0 - 11.0 x1000/?L  RBC 4.34 4.00 - 6.00 M/?L  Hemoglobin 13.7 11.7 - 15.5 g/dL  Hematocrit 60.69 64.99 - 45.00 %  MCV 90.6 80.0 - 100.0 fL  MCH 31.6 27.0 - 33.0 pg  MCHC 34.9 31.0 - 36.0 g/dL  RDW-CV 87.4 88.9 - 84.9 %  Platelets 281 150 - 420 x1000/?L  MPV 10.3 8.0 - 12.0 fL  Neutrophils 90.0 (H) 39.0 - 72.0 %  Lymphocytes 6.9 (L) 17.0 - 50.0 %  Monocytes 2.4 (L) 4.0 - 12.0 %  Eosinophils 0.0 0.0 - 5.0 %  Basophil 0.1 0.0 - 1.4 %  Immature Granulocytes 0.6 0.0 - 1.0 %  nRBC 0.0 0.0 - 1.0 %  Absolute Lymphocyte Count 1.19 0.60 - 3.70 x 1000/?L  Monocyte Absolute Count 0.41 0.00 - 1.00 x 1000/?L  Eosinophil Absolute Count 0.00 0.00 - 1.00 x 1000/?L  Basophil Absolute Count 0.01 0.00 - 1.00 x 1000/?L  Absolute Immature Granulocyte Count 0.10 0.00 - 0.30 x 1000/?L  Absolute nRBC 0.00 0.00 - 1.00 x 1000/?L  ANC (Abs Neutrophil  Count) 15.46 (H) 2.00 - 7.60 x 1000/?L Review of Labs:Results in Past 7 DaysResult Component Current Result Hematocrit 39.30 (02/19/2024) Hemoglobin 13.7 (02/19/2024) MCH 31.6 (02/19/2024) MCHC 34.9 (02/19/2024) MCV 90.6 (02/19/2024) MPV 10.3 (02/19/2024) Platelets 281 (02/19/2024) RBC 4.34 (02/19/2024) WBC 17.2 (H) (02/19/2024)   Chemistry  Lab Results Component Value Date  NA 133 (L) 02/19/2024  K 4.5 02/19/2024  CL 99 02/19/2024  CO2 20 02/19/2024  BUN 10 02/19/2024  CREATININE 0.69 02/19/2024  GLU 114 02/19/2024  Lab Results Component Value Date  CALCIUM  7.3 (L) 02/19/2024  ALKPHOS 176 (H) 02/19/2024  AST 29 02/19/2024  ALT 16 02/19/2024  BILITOT 0.2 02/19/2024  Glucose Date Value Ref Range Status 02/19/2024 114 70 - 140 mg/dL Final 98/72/7973 94 70 - 140 mg/dL Final 91/82/7974 83 70 - 100 mg/dL Final POC Glucose Date Value Ref Range Status 05/08/2023 99 70 - 100 mg/dL Final Assessment/Plan: 28 y.o. G1P0000 @ [redacted]w[redacted]d  admited iso of AFT with severe fetal growth restriction, now with threatened PTL on nifed for tocolysis, and newly diagnosed gHTN. From PTL perspective now stable, mag and epidural off. Plan to continue nifedipine  for tocolysis through the steroid window this afternoon. Continue close symptom monitoring; low threshold to re-examine if more uncomfortable or contracts more frequently. From gHTN perspective, normotensive and asymptomatic. Once done with tocolysis will plan to start long acting nifedipine . Last PEC labs normal. From SFGR with no interval growth on last scan yesterday. Will plan on repeat US  this Friday to assess dopplers. Hadassah Kast [place], [place], PGY21/28/2026

## 2024-02-19 NOTE — Plan of Care [1000001]
 Plan of Care Overview/ Patient StatusTPTL 31.6 post cerclage removal. Tolerating well minimal pain noted after epidural turned off at 0021. Report given to oncoming nurse

## 2024-02-19 NOTE — Evaluation [3041234]
 [facility]	 [facility]	OB Antepartum - Social Round NotePatient Data:  Patient Name: Mariah Ferguson Age: 29 y.o. DOB: 1995/06/08	 MRN: [id number]	 Subjective: In to see Mariah Ferguson early afternoon for social round. Transferred back to [facility] from L&B earlier this morning. Per MFM report, currently stable from a PTL perspective. Mag and epidural off, catheter removed prior to transfer to [facility]. Plan is to continue nifed for tocolysis through steroid window, with continued close symptom monitoring for any signs of labor onset or AFT. Per conversation with Dr. Leverne this morning, plan continued inpatient observation until Friday at which time repeat growth sono will be obtained. Pending patient and fetal status, may be eligible for discharge home with twice weekly testing. Mariah Ferguson resting in bed, just off phone with her mom. Reports she is feeling well overall. Denies contraction pain. Expresses gratitude for care received, and shares she feels well informed of current status and plan of care. Mariah Ferguson goes on to share some of her concerns, frustrations with lack of support from her partner, Mariah Ferguson. Sh[place] and that other than [place], does not have many other close friends or family in the area. Denies any concerns for physical or emotional abuse, neglect or coercion. Shares communication has been challenging, and wishes he would be more present with her, especially given this admission and c/f PTL/FWB. Shares family has been encouraging her to move closer to them after baby comes for additional support, but Mariah Ferguson is unsure that this is the best plan. Wants [place] to be involved, and shares she has worked hard to create a life for herself here, but does recognize that the additional support could be helpful. Mariah Ferguson shares she currently lives with Mariah Ferguson. Raises some concern about amount of smoke in their building from other tenants. Wonders what programs exist for housing assistance, if any.  Mariah Ferguson expresses pride for how she has cared for herself and baby thus far. Does wish she had more support here at the hospital, feels alone at times.  Notes she is a spiritual person, and calls on her faith as well for support. Finds listening to music helps if she starts to feel anxious about things. Objective: Vital Signs:Vitals:  02/19/24 0908 02/19/24 0954 02/19/24 1115 02/19/24 1256 BP: 122/78  118/69 130/79 Pulse: 90 (!) 95 87 (!) 98 Resp: 18   18 Temp: 98.4 ?F (36.9 ?C)   99.3 ?F (37.4 ?C) TempSrc: Temporal   Temporal SpO2:  99%  98% Weight:     Height:     EFM: 125/mod variability/pos accels/no decelsToco: 0-1 ctx/ 10 min averaged over 20Review of Labs1/28 Hct 39.3, Plt 281, Cr 0.69, Ast 29, Alt 16 Pr:Cr 0.21/26 GC/[place] neg, +Candida on vaginosis panel1/26 UA neg, no cx reflex indicatedUltrasound 02/18/24:GESTATIONAL AGE:  LMP:           31w 5d      Date:  07/11/23           EDD:   04/16/24 U/S Today:     27w 1d                                EDD:   05/18/24 Best:          31w 5d   Det. By:  LMP  (07/11/23)    EDD:   04/16/24----------------------------------------------------------------------FETAL EVALUATION:  Num Of Fetuses:          1  Fetal Heart Rate(bpm):   143 Cardiac Activity:        Observed Presentation:            Cephalic Placenta:                Anterior  Amniotic Fluid AFI FV:      Within Normal Limits  AFI Sum(cm)     %Tile       Largest Pocket(cm) 12.6            36          4 RUQ(cm)       RLQ(cm)       LUQ(cm)        LLQ(cm) 3.0           2.5           4.0            3.1----------------------------------------------------------------------BIOPHYSICAL EVALUATION:  Amniotic F.V:   Within normal limits           F. Tone:  Observed F. Movement:    Observed                         Score:  8/8 F. Breathing: Observed----------------------------------------------------------------------BIOMETRY:  BPD:      67.9  mm     G.Age:   27w 3d       < 1  % OFD:        90  mm HC:      254.1  mm     G.Age:   27w 4d       < 1  % AC:      230.9  mm     G.Age:   27w 3d       < 1  % FL:       47.8  mm     G.Age:   26w 0d       < 1  % HUM:      43.9  mm     G.Age:   26w 0d       < 5  % LV:        3.4  mm CI:          75.4  %       70 - 86 FL/HC:       18.8  %       19.1 - 21.3 HC/AC:       1.10          0.96 - 1.17 FL/BPD:      70.4  %       71 - 87 FL/AC:       20.7  %       20 - 24  Est. FW:    1006   gm     2 lb 3 oz     < 1  %----------------------------------------------------------------------ANATOMY:  Cranium:               Normal appearance Ventricles:            Normal appearance Heart:                 Normal 4 chamber appearance Stomach:               Normal appearance Cord Vessels:          Normal 3-Vessel Cord Kidneys:  Normal appearance Bladder:               Normal appearance----------------------------------------------------------------------DOPPLER - FETAL VESSELS:  Umbilical Artery   S/D    %tile   4.1       96 ----------------------------------------------------------------------COMMENTS:  Thank you for referring your patient for fetal growth evaluation due to suspected severe FGR. She has been admitted for preterm contractions and abnormal fetal testing. She has a cerclage in place.  Measurements demonstrate a lag behind dates. The EFW is 1006 grams, which is at the < 1st percentile for this gestational age. The fetus has only gained 18 grams in a two week interval. The AC measures slightly smaller today than it did two weeks ago.  The visualized anatomy appears normal.  The amniotic fluid volume is normal with an MVP of 4.0 cm. Good fetal movement and tone are noted. A fetal biophysical profile was incidentally seen and scored 8/8.  Umbilical artery Dopplers are mildly elevated for gestational age. The S/D ratio measures 4.1. Forward flow is seen in diastole.----------------------------------------------------------------------IMPRESSION:IMPRESSION:   Singleton IUP at 31w 5d  EDD 04/16/2024 based on LMP  (07/11/23)  Suspected severe FGR  Abnormal interval growth  Mildly elevated umbilical artery Doppler  BPP 8/8  Recommendations:  Continue inpatient management with UA Dopplers at least once weekly and repeat growth in 2 weeks to help guide delivery timing. Counseled on the limitations of sonographic EFW but concerns for poor interval growth as a clinical change for her pregnancy representing a progressive process. She is aware that abnormal fetal testing may necessitate delivery before it is indicated by our protocol for FGR alone.Assessment/Plan: 29 y.o. G1P0000 @ [redacted]w[redacted]d admitted for AFT, severe FGR w/ poor interval growth and elevated S/D in UAD, new dx GHTN, and preterm contractions s/p cerclage removal w/ anterior cervical defect. Overall clinically stable at present. Co-managed with MFM. See notes for details of current plan of care. Thanked Mariah Ferguson for her honest conversation today regarding her and baby's status, and her concerns regarding support throughout this process. We discussed the options for social work referral, visit from hospital Chaplain, both of which she readily accepts. Discussed that social work may be able to provide information on housing support services, as well as other postpartum support programs. Pending delivery timing, NICU admission, discussed that NICU will also be a resource for postpartum support. Shares she would appreciate SW and Chaplain referrals - orders placed. MFM team updated. Reviewed MFM and [facility] co-management.  Offered support in communicating current clinical situation to her partner if this would be helpful. She will let us  know. Encouraged to reach out with any questions or concerns. Camie Gerald, CNM1/28/2026

## 2024-02-19 NOTE — Plan of Care [1000001]
 Plan of Care Overview/ [facility]iritual Care NoteAssessment:  Religion:BaptistRoye shared recent concerns with her family and her pregnancy. She expressed her frustrations, worries, and hopes. She explained complex relationship dynamics and the strength of faith in her life. She shared how faith is propelling her to live an independent life. She talked about her personal relationship with God, and we explored how it can strengthen her through these recent difficult situations. She welcomed prayer.Intervention:  Referral Source: PatientResponding Chaplain: Unit ChaplainLanguage or Special Accommodation Rendered?: NoVisit and Intervention Type: Spiritual Visit, Consult Spiritual care interventions provided: Cultural, Religious or Spiritual Resources Interventions: : PrayerRelational or Interpersonal Interventions: : Spiritual Support/Presence, Introduction to [employer], Active Listening and or Reflection, EmpathyMeaning-Making Interventions: : Life Review and/or Story Listening, Facilitation of Spiritual Reflection/Meaning-Making Outcome: With the help of the chaplain, patient/loved one(s): Relational or Interpersonal Outcomes:: Expressed Emotions for Catharsis, Felt Greater Comfort and/or Support Plan: Follow-Up Visit Needed: YesFollow up needed, patient appreciates support during hospitalization.Below is a list of common reasons to consult us  for support:  N: new diagnosis E: emotional/spiritual distress E: existential distress D: decision making/goals of care meetingsS: support for staff and patients' loved ones   C: compromised copingA: anxiety/stress/grief/loneliness R: religious/cultural/ritual needsE: end-of-life care/death/dying Rev. Arrion Broaddus De SilvaStaff Chaplain, Pediatrics and Maternity1/28/2026 5:28 PM

## 2024-02-19 NOTE — Operative Note [1000004]
 [facility]	Operative ReportPatient Data:  Patient Name: Mariah Ferguson Age: 29 y.o. DOB: 10/22/1995	 MRN: [id number]	 DATE OF PROCEDURE/SURGERY:  02/17/2024 - 1/27/2026OPERATION: Procedure(s) and Anesthesia Type:   * CERCLAGE, UTERINE CERVIX, NONOBSTETRICAL - SPINALSURGEONS: Surgeons and Role:   DEWAINE Lie, Curtiss Craven, MD - Primary   * Lynell Kubas, MD - FellowOR STAFF: Circulator: Hyla Domino, RNScrub Person: Margaret, NancyResident: Dock Peck, MBBS ANESTHESIA: CSE PREOP DIAGNOSIS: [redacted]w[redacted]d week pregnancy with painful contractions presumably in labor with cerclage on tension POSTOP DIAGNOSIS: SameESTIMATED BLOOD LOSS:  minimal FLUIDS REPLACED:  noneURINE OUTPUT:  100 ccCOMPLICATIONS:  NoneFINDINGS: Two Shirodkar cerclage on tension. Distal Cerclage tore through the cervic partially. Both Removed and cervix dilated to 1 cm.PROCEDURE:After informed consent was obtained, the patient was taken to the operating room where a time out was performed identifying the correct patient and procedure to be performed. Earlier she started painfully contracting and was found to have the cerclages on tension and was started on Mg gtt.A CSE was placed and sequential compression devices were placed on her lower extremities. The patient was placed in the dorsal lithotomy position in stirrups. She was then prepped and draped in the usual sterile fashion. A surgical pause was performed, and no safety issues were identified. A foley was placed to drain the bladder. A speculum was inserted to visualize the cervix. Two Cerclages were noted to be on tension. Ring forceps x1 were utilized to elevate the mersilene suture at 12 o'clock and separate the tissue below with long forceps. Metzenbaum scissors were used to cut the Shirodkar cerclage between the knot and the cervix, the distal one first followed by the proximal one. The entirety of the mersilene tape was removed. A distal cervical defect was appreciated likely due to cerclage tearing through the anterior lip of cervix. Cervix was found to be dilated to 1 cm.All instruments were removed from the vagina. The patient tolerated the procedure well. The patient tolerated the procedure well. Radiofrequency wanding was negative for retained sponge. Sponge, lap and needle counts were correct x2.  She was placed back on the fetal monitoring. The patient was taken to the labor floor in stable condition.Dr. Lie and Dr. Lynell were present throughout the entire procedure.Melba Araki MBBSPGY-2 Obstetrics, Gynecology and Reproductive Sciences [facility] Electronically Signed by Peck Dock, MBBS, February 18, 2024

## 2024-02-19 NOTE — Evaluation [3041234]
 Resident Evaluation NoteS: Patient comfortable since epidural removal per nursing report and CNM who evaluated patient. Denies contraction pain. Desires po benadryl  for sleep aid, declines IV pain medication.O: BP (!) 109/56  - Pulse (!) 95  - Temp 98.1 ?F (36.7 ?C) (Tympanic)  - Resp 16  - Ht 5' (1.524 m)  - Wt 47 kg  - LMP 07/11/2023 (Exact Date)  - SpO2 97%  - Breastfeeding No  - BMI 20.24 kg/m? Physical exam deferred, patient desires to rest. SVE not indicatedEFM: 120 baseline, mod variability, +accels, -decelsToco: irritableA/P: 29 y.o. G1P0000 @ [redacted]w[redacted]d who is s/p cerclage removal, now comfortable after epidural removal. As she is not contracting and fetal status remains reassuring, SVE deferred. Plan to stop mag gtt, space monitoring, and move to [facility] given stability. Continue nifedipine  tocolysis as scheduled. SVE prn if contracting or becomes uncomfortable.Discussed with OB chief and CNMElectronically signed:Lovely Kerins, MDPGY-2, [place] Obstetrics, Gynecology and Reproductive Sciences Best contact via [employer] Chat1/28/2026 7:37 AM

## 2024-02-20 LAB — CBC WITH AUTO DIFFERENTIAL
BKR WAM ABSOLUTE IMMATURE GRANULOCYTES.: 0.08 x 1000/ÂµL (ref 0.00–0.30)
BKR WAM ABSOLUTE LYMPHOCYTE COUNT.: 2.67 x 1000/ÂµL (ref 0.60–3.70)
BKR WAM ABSOLUTE NRBC: 0.03 x 1000/ÂµL (ref 0.00–1.00)
BKR WAM ANC (ABSOLUTE NEUTROPHIL COUNT): 10.18 x 1000/ÂµL — ABNORMAL HIGH (ref 2.00–7.60)
BKR WAM BASOPHIL ABSOLUTE COUNT.: 0.02 x 1000/ÂµL (ref 0.00–1.00)
BKR WAM BASOPHILS: 0.1 % (ref 0.0–1.4)
BKR WAM EOSINOPHIL ABSOLUTE COUNT.: 0.01 x 1000/ÂµL (ref 0.00–1.00)
BKR WAM EOSINOPHILS: 0.1 % (ref 0.0–5.0)
BKR WAM HEMATOCRIT: 35.7 % (ref 35.00–45.00)
BKR WAM HEMOGLOBIN: 12.5 g/dL (ref 11.7–15.5)
BKR WAM IMMATURE GRANULOCYTES: 0.6 % (ref 0.0–1.0)
BKR WAM LYMPHOCYTES: 18.6 % (ref 17.0–50.0)
BKR WAM MCH: 32.1 pg (ref 27.0–33.0)
BKR WAM MCHC: 35 g/dL (ref 31.0–36.0)
BKR WAM MCV: 91.5 fL (ref 80.0–100.0)
BKR WAM MONOCYTE ABSOLUTE COUNT.: 1.43 x 1000/ÂµL — ABNORMAL HIGH (ref 0.00–1.00)
BKR WAM MONOCYTES: 9.9 % (ref 4.0–12.0)
BKR WAM MPV: 10.5 fL (ref 8.0–12.0)
BKR WAM NEUTROPHILS: 70.7 % (ref 39.0–72.0)
BKR WAM NUCLEATED RED BLOOD CELLS: 0.2 % (ref 0.0–1.0)
BKR WAM PLATELETS: 253 10*3/uL (ref 150–420)
BKR WAM RDW-CV: 13 % (ref 11.0–15.0)
BKR WAM RED BLOOD CELL COUNT.: 3.9 M/ÂµL — ABNORMAL LOW (ref 4.00–6.00)
BKR WAM WHITE BLOOD CELL COUNT: 14.4 10*3/uL — ABNORMAL HIGH (ref 4.0–11.0)

## 2024-02-20 LAB — COMPREHENSIVE METABOLIC PANEL
BKR A/G RATIO: 1.1 (ref 1.0–2.2)
BKR ALANINE AMINOTRANSFERASE (ALT): 26 U/L (ref <=35)
BKR ALBUMIN: 3.4 g/dL — ABNORMAL LOW (ref 3.6–5.1)
BKR ALKALINE PHOSPHATASE: 157 U/L — ABNORMAL HIGH (ref 35–104)
BKR ANION GAP: 11 (ref 7–17)
BKR ASPARTATE AMINOTRANSFERASE (AST): 43 U/L — ABNORMAL HIGH (ref <35)
BKR AST/ALT RATIO: 1.7
BKR BILIRUBIN TOTAL: 0.2 mg/dL (ref <=1.2)
BKR BLOOD UREA NITROGEN: 16 mg/dL (ref 6–20)
BKR BUN / CREAT RATIO: 25.4 — ABNORMAL HIGH (ref 8.0–23.0)
BKR CALCIUM: 8.4 mg/dL — ABNORMAL LOW (ref 8.8–10.2)
BKR CHLORIDE: 104 mmol/L (ref 98–107)
BKR CO2: 19 mmol/L — ABNORMAL LOW (ref 20–30)
BKR CREATININE DELTA: -0.06
BKR CREATININE: 0.63 mg/dL (ref 0.51–0.95)
BKR EGFR, CREATININE (CKD-EPI 2021): >60 mL/min/{1.73_m2} (ref >=60)
BKR GLOBULIN: 3.1 g/dL (ref 1.9–3.9)
BKR GLUCOSE: 82 mg/dL (ref 70–140)
BKR POTASSIUM: 4.1 mmol/L (ref 3.3–5.5)
BKR PROTEIN TOTAL: 6.5 g/dL (ref 5.9–8.3)
BKR SODIUM: 134 mmol/L — ABNORMAL LOW (ref 136–145)

## 2024-02-20 MED ORDER — ACETAMINOPHEN 325 MG TABLET
325 | Freq: Four times a day (QID) | ORAL | Status: DC | PRN
Start: 2024-02-20 — End: 2024-02-24
  Administered 2024-02-20 – 2024-02-22 (×2): 325 mg via ORAL

## 2024-02-20 MED ORDER — DIPHENHYDRAMINE 50 MG/ML INJECTION (WRAPPED E-RX)
50 | Freq: Once | INTRAMUSCULAR | Status: CP
Start: 2024-02-20 — End: ?
  Administered 2024-02-20: 01:00:00 50 mL via INTRAMUSCULAR

## 2024-02-20 MED ORDER — LIDOCAINE 4 % TOPICAL PATCH
4 | TRANSDERMAL | Status: CP
Start: 2024-02-20 — End: ?

## 2024-02-20 MED ORDER — DIPHENHYDRAMINE 50 MG/ML INJECTION (WRAPPED E-RX)
50 | Freq: Once | INTRAVENOUS | Status: CP
Start: 2024-02-20 — End: ?
  Administered 2024-02-20: 01:00:00 50 mL via INTRAVENOUS

## 2024-02-20 MED ORDER — NALBUPHINE 20 MG/ML INJECTION SOLUTION
20 | Freq: Once | INTRAMUSCULAR | Status: CP
Start: 2024-02-20 — End: ?
  Administered 2024-02-20: 01:00:00 20 mL via INTRAMUSCULAR

## 2024-02-20 MED ORDER — HYDROXYZINE HCL 25 MG TABLET
25 | Freq: Three times a day (TID) | ORAL | Status: DC | PRN
Start: 2024-02-20 — End: 2024-02-20

## 2024-02-20 MED ORDER — NALBUPHINE 20 MG/ML INJECTION SOLUTION
20 | Freq: Once | INTRAVENOUS | Status: CP
Start: 2024-02-20 — End: ?
  Administered 2024-02-20: 01:00:00 20 mL via INTRAVENOUS

## 2024-02-20 NOTE — Plan of Care [1000001]
 Plan of Care Overview/ Patient StatusPt is a 28yo G1P0 @32 +0wks admitted for tPTL. C/b cervical insufficiency & FgR. Beta complete 1/28. Pt VSS, afebrile. No VB or LOF. Back pain & contractions reported earlier, nubain /benadryl  given. PIV patent CDI. Pt receiving meds per [facility] and MD orders. S/p mag gtt. CE unchanged overnight: 1/50/-3. Continuous EFM ongoing, see flowsheets. Cerclage removed yesterday. Voiding spontaneously s/p foley & epidural. Questions answered and encouraged. IPOC updated.  Electronically Signed by Jeoffrey Caprio, RN, February 20, 2024

## 2024-02-20 NOTE — Plan of Care [1000001]
 Plan of Care Overview/ Patient StatusSOCIAL WORK SCREENINGPatient Name: Mariah Ferguson Record Number: [id number] Date of Birth: [birth year]SW Admission Screening  Flowsheet Row Most Recent Value SW Admission Screening  Concern for current abuse/neglect/interpersonal violence/sexual assault  No Concern for current homelessness No Current behavioral health concerns No Concern for active substance use No Incomplete emergency contact or next of kin on record No Patient identity unknown No Connected to state agency? No Group home or residential placement No Guardianship or conservatorship No  SOCIAL WORK NOTEPatient Name: Mariah Ferguson Record Number: [id number] Date of Birth: Jun 21, 1997Medical Social Work Follow Up  [employer] Most Recent Value Admission Information  Document Type Careers Information Officer Social Work Screening - No intervention needed (For Inpatient/ED Only) Prior psychosocial assessment has been documented within this hospitalization No (For Inpatient/ED Only) Prior psychosocial assessment has been documented within 30 days of this hospitalization No Level of Care Inpatient What medium(s) of communication were used with patient/family/caregiver? Face-to-Face / In-Person Psychosocial issues requiring intervention Providing support and resources Psychosocial interventions 25 minutes were spent face to face with Mariah Ferguson at her bedside. Mariah Ferguson is currently 32 weeks 0 days gestation admitted to the Maternal Special Care Unit for threatened preterm labor as per chart. I entered the room and explained my role as a child psychotherapist on the unit. Mariah Ferguson appeared pleasant and appropriate. Nursing staff affirmed this sentiment. Mariah Ferguson denied any recent or current mental health concerns and denied any suicidality, homicidality and urges to self-harm. Mariah Ferguson endorsed some marijuana use during pregnancy due to nausea but denied use of any other substance. Mariah Ferguson stated that since starting Zofran  for nausea a few weeks ago her marijuana use has subsided. Mariah Ferguson stated that she currently resides with her partner and father of infant, Mariah Ferguson, renting space in their friend's apartment. Mariah Ferguson stated that Mariah Ferguson and their friend often smoke marijuana and that she has been educating them about safe smoking practices. She reported concerns with infant being around marijuana use after birth and was curious as to other housing options to move out of the apartment of their friend. I provided Mariah Ferguson with a list of community resources including [employer] and [address], where Mariah Ferguson can inquire about housing support. Mariah Ferguson denied any other home or personal safety concerns other than the marijuana use. She named a strong support in her partner, Mariah Ferguson. She noted that she and Mariah Ferguson sometimes argue when (they) are stressed but adamantly denied any history of IPV with Mariah Ferguson stating that he is always so kind to (her) even when (they) are arguing. I provided resources such as [place] [facility] and [facility] if she ever were to feel unsafe. Mariah Ferguson affirmed having all necessary infant supplies including a car seat and bassinet. Safe sleeping education provided. Chart reports Mariah Ferguson utilizes [facility]. I provided a list of additional local community resources. Mariah Ferguson stated that her stepfather is local and supportive, but that the rest of her family resides in [place] . She named strong supports in her family members, despite geographic distance. 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 I placed a referral to Family Support Partnership[facility] at [facility] for extra support. I provided active listening, validation and empathy and assured her consistent social work support for the duration of her admission. Collaborations Patient, medical team Specific referrals to enhance community supports (include existing and new resources) [employer], WIC, [place] [facility], [employer], [employer] (including hand-off): Social work will continue to provide support throughout admission Signature:  Logan Kenisha Lynds LMSW Contact Information: [phone]

## 2024-02-20 NOTE — Evaluation [3041234]
 Resident Evaluation NoteS: Called to bedside for evaluation of painful contractions. Patient reports 5 minutes of sudden onset right back/rib pain worse with inspiration in addition to cramping contractions that are less severe. Contraction pain is not as bad as previous when she needed epidural and her primary concern is the sudden rib/back pain.O: BP 135/86  - Pulse 81  - Temp 98.2 ?F (36.8 ?C) (Temporal)  - Resp 18  - Ht 5' (1.524 m)  - Wt 47 kg  - LMP 07/11/2023 (Exact Date)  - SpO2 98%  - Breastfeeding No  - BMI 20.24 kg/m? Physical ExamGen: writhing in painPulm: NWOB on RAAbd: nontender, gravidSVE: 1/50%EFM: 130 baseline, mod variability, +accel, -decelsToco: irritableA/P: 29 y.o. G1P0000 @[redacted]w[redacted]d  admitted for threatened preterm labor s/p cerclage removal with known cervical defect evaluated for sudden severe back pain. SVE unchanged, no discrete contractions on toco. Epidural declined, desires nubain /bendryl. Pain likely MSK given current presentation. Plan for expectant management and conservative pain control for now per patient preference. Will remain on continuous and recheck prn.Dr. Conrad and OB chief notifiedElectronically signed:Haldon Carley Joellyn, MDPGY-2, [facility], Gynecology and Reproductive Sciences Best contact via [employer] Chat1/28/2026 7:49 PM

## 2024-02-20 NOTE — Plan of Care [1000001]
 Plan of Care Overview/ Patient Belinda is a G1P0 at 31+6 admitted to [facility] for tPTL, cervical insufficiency, FGR and new diagnosis of gHTN. Contractions resumed at shift change, providers notified and monitoring reapplied. Denies leaking of fluid, denies vaginal bleeding. Denies headache, denies epigastric pain, denies visual disturbances. Tolerating regular diet well. Electronically Signed by Yaritsa Savarino, RN, February 19, 2024

## 2024-02-21 ENCOUNTER — Ambulatory Visit: Admit: 2024-02-21 | Payer: MEDICAID | Primary: Obstetrics and Gynecology

## 2024-02-21 ENCOUNTER — Encounter
Admit: 2024-02-21 | Payer: PRIVATE HEALTH INSURANCE | Attending: Obstetrics and Gynecology | Primary: Obstetrics and Gynecology

## 2024-02-21 DIAGNOSIS — O365990 Maternal care for other known or suspected poor fetal growth, unspecified trimester, not applicable or unspecified: Principal | ICD-10-CM

## 2024-02-21 MED ORDER — DIPHENHYDRAMINE 25 MG ORAL TAB/CAP (WRAPPED ERX)
25 | Freq: Once | ORAL | Status: CP
Start: 2024-02-21 — End: ?
  Administered 2024-02-21: 06:00:00 25 mg via ORAL

## 2024-02-21 NOTE — Plan of Care [1000001]
 Plan of Care Overview/ Patient StatusPt is a 28yo G1P0 @32 +1wks admitted for tPTL. C/b cervical insufficiency & FgR. S/p shirodkar cerclage. Beta complete 1/28. S/p mag gtt for neuroprotection. Pt VSS. No VB, LOF, or c/o painful contractions overnight. PIV patent CDI. Benadryl  given overnight for rest, see MAR for med times. EFM Q4, see flowsheets +FM reported.  Questions answered and encouraged. IPOC updated. Report given to oncoming RN. Electronically Signed by Jeoffrey Caprio, RN, February 21, 2024

## 2024-02-21 NOTE — Plan of Care [1000001]
 Plan of Care Overview/ Patient StatusProblem: Adult Inpatient Plan of CareGoal: Plan of Care ReviewOutcome: Interventions implemented as appropriate Pt. Mariah Ferguson 28yo, G1P0, 32+0 wks today, pt admitted to [facility] for sFGR, cervical insufficiency, and tPTL. Pt s/p Mag 1/27 - no complains of headache, blurred vision or RUQ. Pt. Denies any pain, LOF or VB. VSS with gHTN. EFM was WDL, pt kept on montior due to signs of contractions which resolved. Safety maintained, continue care per POC.

## 2024-02-21 NOTE — Progress Notes [1]
 [facility]	OB Antepartum Progress NotePatient Data:  Patient Name: Mariah Ferguson Age: 29 y.o. DOB: [birth year]	 MRN: [id number]	 Interim:28 yo G1P0 @[redacted]w[redacted]d  admited iso of AFT with severe fetal growth restriction, now with threatened PTL on nifed for tocolysis, and newly diagnosed gHTN.Subjective: Feeling well after Nubain /Benadryl  overnight. Feels comfortable. +FM. -LOF, ctx. Denies HA, blurry vision, cp, sob, palpitations, and RUQ pain. Had a cervical exam, unchanged from prior. Objective: Vital Signs:Vitals:  02/19/24 1256 02/19/24 1701 02/19/24 2100 02/19/24 2310 BP: 130/79 135/86 129/84 113/65 Pulse: (!) 98 81 87 71 Resp: 18 18 18 18  Temp: 99.3 ?F (37.4 ?C) 98.2 ?F (36.8 ?C)  98 ?F (36.7 ?C) TempSrc: Temporal Temporal  Temporal SpO2: 98% 98% 99% 99% Weight:     Height:     I/O:Gross Totals (Last 24 hours) at 02/20/2024 [id number] data filed at 02/20/2024 0230Intake -- Output 1700 ml Net -1700 ml Physical Exam:General: NAD, -CV: RRR-Pulm: nlWOB-Abd: gravid, soft nontender -Ext: WWP, 2+ reflexes Vaginal/Cervical Exam: last 1 cm after cerclage removal.EFM: 125 bpm/minimal to mod variability /+accels/infrequent late decel 2-56min prolonged nadir to 100s, infrequent variable decel.Toco: rare ctx/ 10 min averaged over 20Review of LabsLast 24 hours: No results found for this or any previous visit (from the past 24 hours).Review of Labs:Results in Past 7 DaysResult Component Current Result Hematocrit 39.30 (02/19/2024) Hemoglobin 13.7 (02/19/2024) MCH 31.6 (02/19/2024) MCHC 34.9 (02/19/2024) MCV 90.6 (02/19/2024) MPV 10.3 (02/19/2024) Platelets 281 (02/19/2024) RBC 4.34 (02/19/2024) WBC 17.2 (H) (02/19/2024)   Chemistry  Lab Results Component Value Date  NA 133 (L) 02/19/2024  K 4.5 02/19/2024  CL 99 02/19/2024  CO2 20 02/19/2024  BUN 10 02/19/2024  CREATININE 0.69 02/19/2024  GLU 114 02/19/2024  Lab Results Component Value Date  CALCIUM  7.3 (L) 02/19/2024  ALKPHOS 176 (H) 02/19/2024  AST 29 02/19/2024  ALT 16 02/19/2024  BILITOT 0.2 02/19/2024  Glucose Date Value Ref Range Status 02/19/2024 114 70 - 140 mg/dL Final 98/72/7973 94 70 - 140 mg/dL Final 91/82/7974 83 70 - 100 mg/dL Final POC Glucose Date Value Ref Range Status 05/08/2023 99 70 - 100 mg/dL Final Assessment/Plan: 28 y.o. G1P0000 @ [redacted]w[redacted]d  admited iso of AFT with severe fetal growth restriction, now with threatened PTL on nifed for tocolysis, and newly diagnosed gHTN. From PTL perspective now stable, mag and epidural off. Plan to continue nifedipine  for tocolysis through the steroid window this afternoon. Continue close symptom monitoring; low threshold to re-examine if more uncomfortable or contracts more frequently. From gHTN perspective, normotensive and asymptomatic. Now s/p tocolysis, and currently on Nifed 30mg  XL for a long-acting medication. From SFGR with no interval growth on last scan. Will plan on repeat US  this Friday to assess dopplers. PTL-0650 visually closed, cerclage not on tension-0958 closed -vaginitis - wnl, candida pos -> clotrimazole  gel qhs-UA neg1/27- 1 cm, cerclage on tension. S/p 3 cm anterior cervical laceration. - 1/27 1745 -1/28 back pain, contractions - 1cm [x] N/B, tylenol , lidocaine  patch[x]  Nifed 10x3-> 10 q6 hrsAFT- intermittent decels overnight 1/26 > rec'd steroidsgHTN-nl PEC labs -curr: Nifed 30mg  XL 1/29Severe FGR -22w: early onset sFGR, genetics [x] , amnio [x] ; results Neg CMV Neg, wkly Dopplers, Q2w EFW, NST at 32w+-1/13: EFW 988g; < 1 % -1/27 1006g US -indicated Shirodkar cerclage-2 Shirodkar cerclages placed 11/26, both knots @ 12 o'clockS/p emergent ovarian cystectomy -2024Animesh [facility] Obstetrics, Gynecology and Reproductive Sciences [facility] 02/20/2024

## 2024-02-21 NOTE — Progress Notes [1]
 [facility]	OB Antepartum Progress NotePatient Data:  Patient Name: Mariah Ferguson Age: 29 y.o. DOB: [birth year]	 MRN: [id number]	 Interim:28 yo G1P0 @[redacted]w[redacted]d  admited iso of AFT with severe fetal growth restriction, now with threatened PTL on nifed for tocolysis, and newly diagnosed gHTN.Subjective: Denies ctx overnight. Feels comfortable. +FM. -LOF, ctx. Denies HA, blurry vision, cp, sob, palpitations, and RUQ pain.  Objective: Vital Signs:Vitals:  02/20/24 1357 02/20/24 1657 02/20/24 2013 02/21/24 0026 BP: 118/77 (!) 143/79 121/85 115/73 Pulse: 67 68 78 77 Resp: 18 17 18 16  Temp: 98.7 ?F (37.1 ?C) 98.5 ?F (36.9 ?C) 99 ?F (37.2 ?C) 97.4 ?F (36.3 ?C) TempSrc: Temporal Temporal Temporal Temporal SpO2: 98% 98% 100% 100% Weight:     Height:     I/O:No intake or output data in the 24 hours ending 02/21/24 0632Physical Exam:General: NAD, -CV: RRR-Pulm: nlWOB-Abd: gravid, soft nontender -Ext: WWP, 2+ reflexes Vaginal/Cervical Exam: last 1 cm after cerclage removal.EFM: 125 bpm/minimal to mod variability /+accels/infrequent variable decel.Toco: rare ctx/ 10 min averaged over 20Review of LabsLast 24 hours: Recent Results (from the past 24 hours) Comprehensive metabolic panel  Collection Time: 02/20/24  9:40 AM Result Value Ref Range  Sodium 134 (L) 136 - 145 mmol/L  Potassium 4.1 3.3 - 5.5 mmol/L  Chloride 104 98 - 107 mmol/L  CO2 19 (L) 20 - 30 mmol/L  Anion Gap 11 7 - 17  Glucose 82 70 - 140 mg/dL  BUN 16 6 - 20 mg/dL  Creatinine 9.36 9.48 - 0.95 mg/dL  Calcium  8.4 (L) 8.8 - 10.2 mg/dL  BUN/Creatinine Ratio 74.5 (H) 8.0 - 23.0  Total Protein 6.5 5.9 - 8.3 g/dL  Albumin 3.4 (L) 3.6 - 5.1 g/dL  Total Bilirubin 0.2 <=8.7 mg/dL  Alkaline Phosphatase 842 (H) 35 - 104 U/L  Alanine Aminotransferase (ALT) 26 <=35 U/L  Aspartate Aminotransferase (AST) 43 (H) <35 U/L  Globulin 3.1 1.9 - 3.9 g/dL  A/G Ratio 1.1 1.0 - 2.2  AST/ALT Ratio 1.7 Reference Range Not Established  eGFR (Creatinine) >60 >=60 mL/min/1.107m2  Creatinine Delta -0.06 See Comment Type and screen  Collection Time: 02/20/24  9:40 AM Result Value Ref Range  ABO Grouping A   Rh Type POS   Antibody Screen NEG   Specimen Expiration Date And Time 02/23/2024 23:59  CBC auto differential  Collection Time: 02/20/24  9:40 AM Result Value Ref Range  WBC 14.4 (H) 4.0 - 11.0 x1000/?L  RBC 3.90 (L) 4.00 - 6.00 M/?L  Hemoglobin 12.5 11.7 - 15.5 g/dL  Hematocrit 64.29 64.99 - 45.00 %  MCV 91.5 80.0 - 100.0 fL  MCH 32.1 27.0 - 33.0 pg  MCHC 35.0 31.0 - 36.0 g/dL  RDW-CV 86.9 88.9 - 84.9 %  Platelets 253 150 - 420 x1000/?L  MPV 10.5 8.0 - 12.0 fL  Neutrophils 70.7 39.0 - 72.0 %  Lymphocytes 18.6 17.0 - 50.0 %  Monocytes 9.9 4.0 - 12.0 %  Eosinophils 0.1 0.0 - 5.0 %  Basophil 0.1 0.0 - 1.4 %  Immature Granulocytes 0.6 0.0 - 1.0 %  nRBC 0.2 0.0 - 1.0 %  Absolute Lymphocyte Count 2.67 0.60 - 3.70 x 1000/?L  Monocyte Absolute Count 1.43 (H) 0.00 - 1.00 x 1000/?L  Eosinophil Absolute Count 0.01 0.00 - 1.00 x 1000/?L  Basophil Absolute Count 0.02 0.00 - 1.00 x 1000/?L  Absolute Immature Granulocyte Count 0.08 0.00 - 0.30 x 1000/?L  Absolute nRBC 0.03 0.00 - 1.00 x 1000/?L  ANC (Abs Neutrophil Count) 10.18 (  H) 2.00 - 7.60 x 1000/?L Review of Labs:Results in Past 7 DaysResult Component Current Result Hematocrit 35.70 (02/20/2024) Hemoglobin 12.5 (02/20/2024) MCH 32.1 (02/20/2024) MCHC 35.0 (02/20/2024) MCV 91.5 (02/20/2024) MPV 10.5 (02/20/2024) Platelets 253 (02/20/2024) RBC 3.90 (L) (02/20/2024) WBC 14.4 (H) (02/20/2024)   Chemistry  Lab Results Component Value Date  NA 134 (L) 02/20/2024  K 4.1 02/20/2024  CL 104 02/20/2024  CO2 19 (L) 02/20/2024  BUN 16 02/20/2024  CREATININE 0.63 02/20/2024  GLU 82 02/20/2024 Lab Results Component Value Date  CALCIUM  8.4 (L) 02/20/2024  ALKPHOS 157 (H) 02/20/2024  AST 43 (H) 02/20/2024  ALT 26 02/20/2024  BILITOT 0.2 02/20/2024  Glucose Date Value Ref Range Status 02/20/2024 82 70 - 140 mg/dL Final 98/71/7973 885 70 - 140 mg/dL Final 98/72/7973 94 70 - 140 mg/dL Final 91/82/7974 83 70 - 100 mg/dL Final Assessment/Plan: 28 y.o. G1P0000 @ [redacted]w[redacted]d  admited iso of AFT with severe fetal growth restriction, now with threatened PTL on nifed for tocolysis, and newly diagnosed gHTN. From PTL perspective now stable, mag and epidural off. Plan to continue nifedipine  for tocolysis through the steroid window this afternoon. Continue close symptom monitoring; low threshold to re-examine if more uncomfortable or contracts more frequently. From gHTN perspective, normotensive and asymptomatic. Now s/p tocolysis, and currently on Nifed 30mg  XL for a long-acting medication. Awaiting AM labs to assess her LFTs from yesterday. From SFGR with no interval growth on last scan. Plan to perform dopplers today. PTL-0650 visually closed, cerclage not on tension-0958 closed -vaginitis - wnl, candida pos -> clotrimazole  gel qhs-UA neg1/27- 1 cm, cerclage on tension. S/p 3 cm anterior cervical laceration. - 1/27 1745 -1/28 back pain, contractions - 1cm [x] N/B, tylenol , lidocaine  patch[x]  Nifed 10x3-> 10 q6 hrsAFT- intermittent decels overnight 1/26 > rec'd steroidsgHTN-nl PEC labs (elevated AST 43 1/29)-curr: Nifed 30mg  XL 1/29[] am Labs Severe FGR -22w: early onset sFGR, genetics [x] , amnio [x] ; results Neg CMV Neg, wkly Dopplers, Q2w EFW, NST at 32w+-1/13: EFW 988g; < 1 % -1/27 1006g US -indicated Shirodkar cerclage-2 Shirodkar cerclages placed 11/26, both knots @ 12 o'clockS/p emergent ovarian cystectomy -2024Animesh [facility] Obstetrics, Gynecology and Reproductive Sciences [facility] 02/21/2024

## 2024-02-22 ENCOUNTER — Inpatient Hospital Stay: Admit: 2024-02-22 | Payer: MEDICAID

## 2024-02-22 LAB — COMPREHENSIVE METABOLIC PANEL
BKR A/G RATIO: 1.1 (ref 1.0–2.2)
BKR ALANINE AMINOTRANSFERASE (ALT): 38 U/L — ABNORMAL HIGH (ref <=35)
BKR ALBUMIN: 3.5 g/dL — ABNORMAL LOW (ref 3.6–5.1)
BKR ALKALINE PHOSPHATASE: 171 U/L — ABNORMAL HIGH (ref 35–104)
BKR ANION GAP: 13 (ref 7–17)
BKR ASPARTATE AMINOTRANSFERASE (AST): 32 U/L (ref <35)
BKR AST/ALT RATIO: 0.8
BKR BILIRUBIN TOTAL: 0.3 mg/dL (ref <=1.2)
BKR BLOOD UREA NITROGEN: 12 mg/dL (ref 6–20)
BKR BUN / CREAT RATIO: 16.7 (ref 8.0–23.0)
BKR CALCIUM: 8.5 mg/dL — ABNORMAL LOW (ref 8.8–10.2)
BKR CHLORIDE: 102 mmol/L (ref 98–107)
BKR CO2: 21 mmol/L (ref 20–30)
BKR CREATININE DELTA: 0.09
BKR CREATININE: 0.72 mg/dL (ref 0.51–0.95)
BKR EGFR, CREATININE (CKD-EPI 2021): >60 mL/min/{1.73_m2} (ref >=60)
BKR GLOBULIN: 3.2 g/dL (ref 1.9–3.9)
BKR GLUCOSE: 59 mg/dL — ABNORMAL LOW (ref 70–140)
BKR POTASSIUM: 4.1 mmol/L (ref 3.3–5.5)
BKR PROTEIN TOTAL: 6.7 g/dL (ref 5.9–8.3)
BKR SODIUM: 136 mmol/L (ref 136–145)

## 2024-02-22 LAB — URINALYSIS-MACROSCOPIC W/REFLEX MICROSCOPIC
BKR BILIRUBIN, UA: NEGATIVE
BKR GLUCOSE, UA: NEGATIVE
BKR KETONES, UA: NEGATIVE
BKR NITRITE, UA: NEGATIVE
BKR PH, UA: 6.5 (ref 5.5–7.5)
BKR PROTEIN, UA: NEGATIVE
BKR SPECIFIC GRAVITY, UA: 1.021 (ref 1.005–1.030)
BKR UROBILINOGEN, UA: <2 mg/dL (ref <=2.0)

## 2024-02-22 LAB — CBC WITH AUTO DIFFERENTIAL
BKR WAM ABSOLUTE IMMATURE GRANULOCYTES.: 0.02 x 1000/ÂµL (ref 0.00–0.30)
BKR WAM ABSOLUTE LYMPHOCYTE COUNT.: 1.98 x 1000/ÂµL (ref 0.60–3.70)
BKR WAM ABSOLUTE NRBC: 0 x 1000/ÂµL (ref 0.00–1.00)
BKR WAM ANC (ABSOLUTE NEUTROPHIL COUNT): 7.14 x 1000/ÂµL (ref 2.00–7.60)
BKR WAM BASOPHIL ABSOLUTE COUNT.: 0.02 x 1000/ÂµL (ref 0.00–1.00)
BKR WAM BASOPHILS: 0.2 % (ref 0.0–1.4)
BKR WAM EOSINOPHIL ABSOLUTE COUNT.: 0.03 x 1000/ÂµL (ref 0.00–1.00)
BKR WAM EOSINOPHILS: 0.3 % (ref 0.0–5.0)
BKR WAM HEMATOCRIT: 39.6 % (ref 35.00–45.00)
BKR WAM HEMOGLOBIN: 13.2 g/dL (ref 11.7–15.5)
BKR WAM IMMATURE GRANULOCYTES: 0.2 % (ref 0.0–1.0)
BKR WAM LYMPHOCYTES: 19.8 % (ref 17.0–50.0)
BKR WAM MCH: 30.4 pg (ref 27.0–33.0)
BKR WAM MCHC: 33.3 g/dL (ref 31.0–36.0)
BKR WAM MCV: 91.2 fL (ref 80.0–100.0)
BKR WAM MONOCYTE ABSOLUTE COUNT.: 0.83 x 1000/ÂµL (ref 0.00–1.00)
BKR WAM MONOCYTES: 8.3 % (ref 4.0–12.0)
BKR WAM MPV: 10.3 fL (ref 8.0–12.0)
BKR WAM NEUTROPHILS: 71.2 % (ref 39.0–72.0)
BKR WAM NUCLEATED RED BLOOD CELLS: 0 % (ref 0.0–1.0)
BKR WAM PLATELETS: 254 10*3/uL (ref 150–420)
BKR WAM RDW-CV: 12.7 % (ref 11.0–15.0)
BKR WAM RED BLOOD CELL COUNT.: 4.34 M/ÂµL (ref 4.00–6.00)
BKR WAM WHITE BLOOD CELL COUNT: 10 10*3/uL (ref 4.0–11.0)

## 2024-02-22 LAB — URINE MICROSCOPIC     (BH GH LMW YH)
BKR RBC/HPF, UA (INSTRUMENT): 1 /HPF (ref 0–2)
BKR URINE SQUAMOUS EPITHELIAL CELLS, UA (INSTRUMENT): 6 /HPF — ABNORMAL HIGH (ref 0–5)
BKR WBC/HPF, UA (INSTRUMENT): 5 /HPF (ref 0–5)

## 2024-02-22 LAB — PROTEIN, TOTAL, URINE RANDOM WITH CREATININE    (BH GH LMW YH)
BKR CREATININE, URINE, RANDOM: 245 mg/dL
BKR PROTEIN URINE RANDOM MG/DL: 44.6 mg/dL
BKR PROTEIN/CREATININE RATIO, URINE, RANDOM: 0.2 mg/mg{creat} — ABNORMAL HIGH (ref <0.1)

## 2024-02-22 MED ORDER — LACTATED RINGERS IV BOLUS NEW BAG (DROPS CHARGE)
Freq: Once | INTRAVENOUS | Status: CP
Start: 2024-02-22 — End: ?
  Administered 2024-02-22: 02:00:00 1000.000 mL/h via INTRAVENOUS

## 2024-02-22 MED ORDER — LIDOCAINE 4 % TOPICAL PATCH
4 | TRANSDERMAL | Status: DC
Start: 2024-02-22 — End: 2024-02-28

## 2024-02-22 MED ORDER — CYCLOBENZAPRINE 5 MG TABLET
5 | Freq: Three times a day (TID) | ORAL | Status: DC | PRN
Start: 2024-02-22 — End: 2024-02-28
  Administered 2024-02-21: 21:00:00 10 mg via ORAL
  Administered 2024-02-22: 02:00:00 5 mg via ORAL

## 2024-02-22 NOTE — Evaluation [3041234]
 S: Called by RN to evaluate patient, reporting painful contractions. Upon entering room, [name] in H&K on labor bed, writhing in pain, crying. Reports onset of painful ctxs ~1830.  Reports constant lower back pain, with contractions that wrap around to her lower belly. Notes these contractions feel significantly more intense then the ones she had on admission. O: BP 109/63  - Pulse 90  - Temp 98 ?F (36.7 ?C) (Temporal)  - Resp (P) 16  - Ht 5' (1.524 m)  - Wt 47 kg  - LMP 07/11/2023 (Exact Date)  - SpO2 100%  - Breastfeeding No  - BMI 20.24 kg/m? EFM: 140 bpm/min to mod var/+accels/intermittent variable decelsToco: 2-3 in 10 averaged over 20SVE: 2/70/-1Performed by CANDIE Gerald CNMChaperone: G. Codner, RNA: 28 y.o. G1P0000 @ [redacted]w[redacted]d admitted for AFT, severe FGR w/ poor interval growth and elevated S/D in UAD, new dx GHTN, and preterm contractions s/p cerclage removal w/ anterior cervical defect. S/p nifed for tocolysis, MgSO4 for neuro protection and beta complete. Continues on qd nifed 30mg  XL for dx of gHTN. No PEC sxs at present. Labs yesterday notable for elevated AST, otherwise WNL. Now making cervical change with onset of painful contractions. P: Report given to [facility] Chief Natasha and MD Chase, who will evaluate further and plan next steps. Plan at this time is for continued co-management. MD team aware that M. Lawrance CNM is now in house for [facility] and will participate in any appropriate aspect of pt care, including labor and birth, should she progress. Sign out provider to M. Telfer CNM. Camie Gerald, CNM1/30/2026 9:00 PM

## 2024-02-22 NOTE — Plan of Care [1000001]
 Plan of Care Overview/ Patient StatusProblem: Adult Inpatient Plan of CareGoal: Plan of Care ReviewOutcome: Interventions implemented as appropriate Pt. Mariah Ferguson 28yo, G1P0, 32+0 wks today, pt admitted to [facility] for sFGR, cervical insufficiency, and tPTL. Pt s/p Mag 1/27 - no complains of headache, blurred vision or RUQ. Pt. Denies any pain, LOF or VB. VSS with gHTN. EFM was WDL, pt kept on montior due to signs of contractions which resolved. Safety maintained, continue care per POC.

## 2024-02-22 NOTE — Plan of Care [1000001]
 Plan of Care Overview/ Patient StatusG1P0 @32 +2 weeks here for sFGR, cervical insufficiency and threatened preterm labor. GBS neg, VSS, continuous EFM. S/P mag and cerclage 1/27, last exam 2/80/-1. Complains of ctx pain, back pain. CNM and MD aware. Managed with pain meds, See MAR and flowsheet for further details. Electronically Signed by Jaye Ivory, RN, February 22, 2024

## 2024-02-22 NOTE — Progress Notes [1]
 [facility]	 [facility]	OB Antepartum Progress NoteSubjective: Mariah Ferguson reports feeling contraction pains that start on her right lower front and then wrap around to her back, they are coming every 3-4 minutes. She does state that the pain is gone when they go. Objective: Vital Signs:Temp:  [97.4 ?F (36.3 ?C)-98 ?F (36.7 ?C)] 98 ?F (36.7 ?C)Pulse:  [77-90] 90Resp:  [16-18] 16BP: (109-119)/(63-78) 109/63SpO2:  [99 %-100 %] 100 %No intake or output data in the 24 hours ending 02/21/24 2157Physical ExamVE: little change from 20:00 exam by CANDIE Gerald  now - 2/80/-1 Fetal Heart Rate Assessment: Baseline 130, Variability  Moderate, Acceleration Present, and Decelerations Type Variable IntermittentMultiples: NAFHR Category is:  IIUterine Contractions: Method toco/palpation, Frequency 3 per 10 minutes averaged over 30 minutes., and Tachysystole absentReview of LabsNo results found for this or any previous visit (from the past 24 hours).Assessment/Plan: 29 y.o. G1P0000 @ [redacted]w[redacted]d EGA, admitted for AFT and tPTL, gHTNPTL-1945 2/70/-2 Cervix with minimal change now 2/80/-1[x] IVF Flexeril  UA C&S - consider US  for nephrolithiasisMonitor closely and re-check as indicatedAnesthesia made aware and will get consent now in case she progresses into laborDiscussed with Dr. Natasha and Chase and will continue to monitor closely for labor progressAFT - intermittent decels overnight 1/26 > rec'd steroids gHTN -adm PEC labs wnl -1/29 Hct 35.7, Plt 253, Cr 0.63, Alt 26, Ast 46 -curr: Nifed 30mg  XL 1/29- [] AM labs Severe FGR -22w: early onset sFGR, genetics [x] , amnio [x] ; results Neg CMV Neg, wkly Dopplers, Q2w EFW, NST at 32w+ -1/13: EFW 988g; < 1 % -1/30 elevated dopplers Rosaline LITTIE Petrin, CNM1/30/20269:57 PM

## 2024-02-23 ENCOUNTER — Encounter
Admit: 2024-02-23 | Payer: PRIVATE HEALTH INSURANCE | Attending: Obstetrics and Gynecology | Primary: Obstetrics and Gynecology

## 2024-02-23 ENCOUNTER — Encounter: Admit: 2024-02-23 | Payer: MEDICAID | Primary: Obstetrics and Gynecology

## 2024-02-23 DIAGNOSIS — R7303 Prediabetes: Secondary | ICD-10-CM

## 2024-02-23 DIAGNOSIS — R109 Unspecified abdominal pain: Secondary | ICD-10-CM

## 2024-02-23 DIAGNOSIS — N83201 Unspecified ovarian cyst, right side: Principal | ICD-10-CM

## 2024-02-23 DIAGNOSIS — N730 Acute parametritis and pelvic cellulitis: Secondary | ICD-10-CM

## 2024-02-23 DIAGNOSIS — N7011 Chronic salpingitis: Secondary | ICD-10-CM

## 2024-02-23 DIAGNOSIS — E282 Polycystic ovarian syndrome: Secondary | ICD-10-CM

## 2024-02-23 DIAGNOSIS — A599 Trichomoniasis, unspecified: Secondary | ICD-10-CM

## 2024-02-23 MED ORDER — DIPHENHYDRAMINE 50 MG/ML INJECTION (WRAPPED E-RX)
50 | Freq: Once | INTRAMUSCULAR | Status: CP
Start: 2024-02-23 — End: ?
  Administered 2024-02-23: 23:00:00 50 mL via INTRAMUSCULAR

## 2024-02-23 MED ORDER — DIPHENHYDRAMINE 50 MG/ML INJECTION (WRAPPED E-RX)
50 | Freq: Once | INTRAVENOUS | Status: CP
Start: 2024-02-23 — End: ?
  Administered 2024-02-23: 23:00:00 50 mL via INTRAVENOUS

## 2024-02-23 MED ORDER — LACTATED RINGERS IV BOLUS NEW BAG (DROPS CHARGE)
Freq: Once | INTRAVENOUS | Status: CP
Start: 2024-02-23 — End: ?
  Administered 2024-02-23: 23:00:00 1000.000 mL/h via INTRAVENOUS

## 2024-02-23 MED ORDER — NALBUPHINE 20 MG/ML INJECTION SOLUTION
20 | Freq: Once | INTRAMUSCULAR | Status: CP
Start: 2024-02-23 — End: ?
  Administered 2024-02-23: 23:00:00 20 mL via INTRAMUSCULAR

## 2024-02-23 MED ORDER — NALBUPHINE 20 MG/ML INJECTION SOLUTION
20 | Freq: Once | INTRAVENOUS | Status: CP
Start: 2024-02-23 — End: ?
  Administered 2024-02-23: 23:00:00 20 mL via INTRAVENOUS

## 2024-02-23 NOTE — Progress Notes [1]
 [facility]	OB Antepartum Progress NotePatient Data:  Patient Name: Mariah Ferguson Age: 29 y.o. DOB: December 13, 1995	 MRN: [id number]	 Interim:28 yo G1P0 @[redacted]w[redacted]d  admited iso of AFT with severe fetal growth restriction, now with threatened PTL on nifed for tocolysis, and newly diagnosed gHTN.Subjective: Reported contractions overnight, resolved spontaneously, Was able  to sleep well. This am Feels comfortable. +FM. -LOF, ctx. Denies HA, blurry vision, cp, sob, palpitations, and RUQ pain.  Objective: Vital Signs:Vitals:  02/22/24 1042 02/22/24 1829 02/22/24 2048 02/23/24 0010 BP: 112/78 112/73 (!) 124/91 131/83 Pulse: 78 (!) 107 76 87 Resp: 16 18  18  Temp: 97.9 ?F (36.6 ?C) 97.7 ?F (36.5 ?C)  98.8 ?F (37.1 ?C) TempSrc: Oral   Temporal SpO2: 100% 99%  97% Weight:     Height:     I/O:No intake or output data in the 24 hours ending 02/23/24 0509Physical Exam:General: NAD, -CV: RRR-Pulm: nlWOB-Abd: gravid, soft nontender -Ext: WWP, 2+ reflexes Vaginal/Cervical Exam: 1/30/262040 2/80/-1 Examiner: Dr. Twana: 125 bpm/minimal to mod variability /+accels/infrequent variable decel.Toco: rare ctx/ 10 min averaged over 20Review of LabsLast 24 hours: Recent Results (from the past 24 hours) Urinalysis-macroscopic w/reflex microscopic  Collection Time: 02/22/24  6:26 AM Result Value Ref Range  Clarity, UA Clear Clear  Color, UA Yellow Yellow, Colorless  Specific Gravity, UA 1.021 1.005 - 1.030  pH, UA 6.5 5.5 - 7.5  Protein, UA Negative Negative, Trace  Glucose, UA Negative Negative  Ketones, UA Negative Negative  Blood, UA 1+ (A) Negative  Bilirubin, UA Negative Negative  Leukocytes, UA 2+ (A) Negative  Nitrite, UA Negative Negative  Urobilinogen, UA <2.0 <=2.0 mg/dL Urine microscopic     (BH GH LMW YH)  Collection Time: 02/22/24  6:26 AM Result Value Ref Range  RBC/HPF, UA 1 0 - 2 /HPF  WBC/HPF, UA 5 0 - 5 /HPF  Bacteria, UA Few (A) None-Rare /HPF  Urine Squamous Epithelial Cells, UA 6 (H) 0 - 5 /HPF Comprehensive metabolic panel  Collection Time: 02/22/24  9:28 AM Result Value Ref Range  Sodium 136 136 - 145 mmol/L  Potassium 4.1 3.3 - 5.5 mmol/L  Chloride 102 98 - 107 mmol/L  CO2 21 20 - 30 mmol/L  Anion Gap 13 7 - 17  Glucose 59 (L) 70 - 140 mg/dL  BUN 12 6 - 20 mg/dL  Creatinine 9.27 9.48 - 0.95 mg/dL  Calcium  8.5 (L) 8.8 - 10.2 mg/dL  BUN/Creatinine Ratio 83.2 8.0 - 23.0  Total Protein 6.7 5.9 - 8.3 g/dL  Albumin 3.5 (L) 3.6 - 5.1 g/dL  Total Bilirubin 0.3 <=8.7 mg/dL  Alkaline Phosphatase 828 (H) 35 - 104 U/L  Alanine Aminotransferase (ALT) 38 (H) <=35 U/L  Aspartate Aminotransferase (AST) 32 <35 U/L  Globulin 3.2 1.9 - 3.9 g/dL  A/G Ratio 1.1 1.0 - 2.2  AST/ALT Ratio 0.8 Reference Range Not Established  eGFR (Creatinine) >60 >=60 mL/min/1.36m2  Creatinine Delta 0.09 See Comment CBC auto differential  Collection Time: 02/22/24  9:28 AM Result Value Ref Range  WBC 10.0 4.0 - 11.0 x1000/?L  RBC 4.34 4.00 - 6.00 M/?L  Hemoglobin 13.2 11.7 - 15.5 g/dL  Hematocrit 60.39 64.99 - 45.00 %  MCV 91.2 80.0 - 100.0 fL  MCH 30.4 27.0 - 33.0 pg  MCHC 33.3 31.0 - 36.0 g/dL  RDW-CV 87.2 88.9 - 84.9 %  Platelets 254 150 - 420 x1000/?L  MPV 10.3 8.0 - 12.0 fL  Neutrophils 71.2 39.0 - 72.0 %  Lymphocytes 19.8 17.0 -  50.0 %  Monocytes 8.3 4.0 - 12.0 %  Eosinophils 0.3 0.0 - 5.0 %  Basophil 0.2 0.0 - 1.4 %  Immature Granulocytes 0.2 0.0 - 1.0 %  nRBC 0.0 0.0 - 1.0 %  Absolute Lymphocyte Count 1.98 0.60 - 3.70 x 1000/?L  Monocyte Absolute Count 0.83 0.00 - 1.00 x 1000/?L  Eosinophil Absolute Count 0.03 0.00 - 1.00 x 1000/?L  Basophil Absolute Count 0.02 0.00 - 1.00 x 1000/?L  Absolute Immature Granulocyte Count 0.02 0.00 - 0.30 x 1000/?L  Absolute nRBC 0.00 0.00 - 1.00 x 1000/?L  ANC (Abs Neutrophil Count) 7.14 2.00 - 7.60 x 1000/?L Protein, total, urine random with creatinine    (BH GH LMW YH)  Collection Time: 02/22/24  3:25 PM Result Value Ref Range  Creatinine, Urine, Random 245 Reference Range Not Established mg/dL  Protein/Creatinine Ratio, Urine, Random 0.2 (H) <0.1 mg/mg Cr  Protein Urine Random 44.6 Reference range is not established mg/dL Review of Labs:Results in Past 7 DaysResult Component Current Result Hematocrit 39.60 (02/22/2024) Hemoglobin 13.2 (02/22/2024) MCH 30.4 (02/22/2024) MCHC 33.3 (02/22/2024) MCV 91.2 (02/22/2024) MPV 10.3 (02/22/2024) Platelets 254 (02/22/2024) RBC 4.34 (02/22/2024) WBC 10.0 (02/22/2024)   Chemistry  Lab Results Component Value Date  NA 136 02/22/2024  K 4.1 02/22/2024  CL 102 02/22/2024  CO2 21 02/22/2024  BUN 12 02/22/2024  CREATININE 0.72 02/22/2024  GLU 59 (L) 02/22/2024  Lab Results Component Value Date  CALCIUM  8.5 (L) 02/22/2024  ALKPHOS 171 (H) 02/22/2024  AST 32 02/22/2024  ALT 38 (H) 02/22/2024  BILITOT 0.3 02/22/2024  Glucose Date Value Ref Range Status 02/22/2024 59 (L) 70 - 140 mg/dL Final 98/70/7973 82 70 - 140 mg/dL Final 98/71/7973 885 70 - 140 mg/dL Final 98/72/7973 94 70 - 140 mg/dL Final Assessment/Plan: 28 y.o. G1P0000 @ [redacted]w[redacted]d  admited iso of AFT with severe fetal growth restriction, now with threatened PTL on nifed for tocolysis, and newly diagnosed gHTN, continues to be on nifed 30mg  XL. From PTL perspective now stable, mag and epidural off. She is s/p nifedipine  for tocolysis through the steroid window. Continue close symptom monitoring; low threshold to re-examine if more uncomfortable or contracts more frequently. From gHTN perspective, normotensive and asymptomatic. Now s/p tocolysis, and currently on Nifed 30mg  XL for a long-acting medication. Awaiting AM labs to assess her LFTs from yesterday. From SFGR with no interval growth on last scan, now with elevated dopplers. Evaluating her back pain further this am. Back pain/rib pain -running alongside paraspinous muscles L>R; L flank pain, L CVAT[]  UA, strain urine-Consider renal us , deferred for nowPTL-1/27 visually closed, cerclage on tension w cervical lac -> removed -1/28 back pain, contractions - 1cm [x] N/B, tylenol , lidocaine  patch-S/p nied tocolysis (1/29 2300) -1/30 1945 2/70/-12040 2/80/-1gHTN-adm PEC labs wnl-1/31 Hct 39.6, Plt 254, Cr 0.72, Alt 38, Ast 32, Pr:Cr 0.2-Curr: Nifed 30mg  XL 1/29-Severe FGR -22w: early onset sFGR, genetics [x] , amnio [x] ; results Neg CMV Neg, wkly Dopplers, Q2w EFW, NST at 32w+-1/13: EFW 988g; < 1 % -1/27 1006g <1%-1/30 elevated dopplers Back pain/rib pain-Left flank -> resolved with flexeril -1/31 Renal u/s wnl-UA wnl; []  f/u Ucx Tyvon Eggenberger [id number] Obstetrics, Gynecology and Reproductive Sciences [facility] 02/23/2024

## 2024-02-23 NOTE — Plan of Care [1000001]
 Plan of Care Overview/ Patient StatusPatient is G1P0 at 32W+3D admitted for sFGR, cervical insufficiency and threatened preterm labor. GBS negative. Afebrile. VSS on room air. S/p mag & cerclage removal on 01/27. Reported braxton hicks ctx, provider aware. Lidocaine  patches intact on back. Q4H EFM documented in flowsheet. LVE: 2/80/-1 on 01/30. Safety maintained. Plan of care ongoing. See MAR and flowsheet for details.Problem: Adult Inpatient Plan of CareGoal: Plan of Care ReviewOutcome: Interventions implemented as appropriateGoal: Patient-Specific Goal (Individualized)Outcome: Interventions implemented as appropriateGoal: Absence of Hospital-Acquired Illness or InjuryOutcome: Interventions implemented as appropriateGoal: Optimal Comfort and WellbeingOutcome: Interventions implemented as appropriateGoal: Readiness for Transition of CareOutcome: Interventions implemented as appropriate Problem: Preterm LaborGoal: Delayed Preterm BirthOutcome: Interventions implemented as appropriate Problem: Hospitalized Perinatal PatientGoal: Optimal Patient and Fetal WellbeingOutcome: Interventions implemented as appropriate Problem: Fall Injury RiskGoal: Absence of Fall and Fall-Related InjuryOutcome: Interventions implemented as appropriate

## 2024-02-24 ENCOUNTER — Encounter: Admit: 2024-02-24 | Payer: PRIVATE HEALTH INSURANCE | Primary: Obstetrics and Gynecology

## 2024-02-24 ENCOUNTER — Encounter: Admit: 2024-02-24 | Payer: MEDICAID | Primary: Obstetrics and Gynecology

## 2024-02-24 LAB — CBC WITH AUTO DIFFERENTIAL
BKR WAM ABSOLUTE IMMATURE GRANULOCYTES.: 0.06 x 1000/ÂµL (ref 0.00–0.30)
BKR WAM ABSOLUTE IMMATURE GRANULOCYTES.: 0.12 x 1000/ÂµL (ref 0.00–0.30)
BKR WAM ABSOLUTE LYMPHOCYTE COUNT.: 2.3 x 1000/ÂµL (ref 0.60–3.70)
BKR WAM ABSOLUTE LYMPHOCYTE COUNT.: 2.29 x 1000/ÂµL (ref 0.60–3.70)
BKR WAM ABSOLUTE NRBC: 0 x 1000/ÂµL (ref 0.00–1.00)
BKR WAM ABSOLUTE NRBC: 0 x 1000/ÂµL (ref 0.00–1.00)
BKR WAM ANC (ABSOLUTE NEUTROPHIL COUNT): 9.46 x 1000/ÂµL — ABNORMAL HIGH (ref 2.00–7.60)
BKR WAM ANC (ABSOLUTE NEUTROPHIL COUNT): 16.48 x 1000/ÂµL — ABNORMAL HIGH (ref 2.00–7.60)
BKR WAM BASOPHIL ABSOLUTE COUNT.: 0.01 x 1000/ÂµL (ref 0.00–1.00)
BKR WAM BASOPHIL ABSOLUTE COUNT.: 0.03 x 1000/ÂµL (ref 0.00–1.00)
BKR WAM BASOPHILS: 0.1 % (ref 0.0–1.4)
BKR WAM BASOPHILS: 0.2 % (ref 0.0–1.4)
BKR WAM EOSINOPHIL ABSOLUTE COUNT.: 0.06 x 1000/ÂµL (ref 0.00–1.00)
BKR WAM EOSINOPHIL ABSOLUTE COUNT.: 0 x 1000/ÂµL (ref 0.00–1.00)
BKR WAM EOSINOPHILS: 0.5 % (ref 0.0–5.0)
BKR WAM EOSINOPHILS: 0 % (ref 0.0–5.0)
BKR WAM HEMATOCRIT: 41.4 % (ref 35.00–45.00)
BKR WAM HEMATOCRIT: 36.7 % (ref 35.00–45.00)
BKR WAM HEMOGLOBIN: 14.2 g/dL (ref 11.7–15.5)
BKR WAM HEMOGLOBIN: 12.8 g/dL (ref 11.7–15.5)
BKR WAM IMMATURE GRANULOCYTES: 0.5 % (ref 0.0–1.0)
BKR WAM IMMATURE GRANULOCYTES: 0.6 % (ref 0.0–1.0)
BKR WAM LYMPHOCYTES: 17.9 % (ref 17.0–50.0)
BKR WAM LYMPHOCYTES: 11.6 % — ABNORMAL LOW (ref 17.0–50.0)
BKR WAM MCH: 30.9 pg (ref 27.0–33.0)
BKR WAM MCH: 31.4 pg (ref 27.0–33.0)
BKR WAM MCHC: 34.3 g/dL (ref 31.0–36.0)
BKR WAM MCHC: 34.9 g/dL (ref 31.0–36.0)
BKR WAM MCV: 90 fL (ref 80.0–100.0)
BKR WAM MCV: 90 fL (ref 80.0–100.0)
BKR WAM MONOCYTE ABSOLUTE COUNT.: 0.93 x 1000/ÂµL (ref 0.00–1.00)
BKR WAM MONOCYTE ABSOLUTE COUNT.: 0.85 x 1000/ÂµL (ref 0.00–1.00)
BKR WAM MONOCYTES: 7.3 % (ref 4.0–12.0)
BKR WAM MONOCYTES: 4.3 % (ref 4.0–12.0)
BKR WAM MPV: 10 fL (ref 8.0–12.0)
BKR WAM MPV: 10.6 fL (ref 8.0–12.0)
BKR WAM NEUTROPHILS: 73.7 % — ABNORMAL HIGH (ref 39.0–72.0)
BKR WAM NEUTROPHILS: 83.3 % — ABNORMAL HIGH (ref 39.0–72.0)
BKR WAM NUCLEATED RED BLOOD CELLS: 0 % (ref 0.0–1.0)
BKR WAM NUCLEATED RED BLOOD CELLS: 0 % (ref 0.0–1.0)
BKR WAM PLATELETS: 256 10*3/uL (ref 150–420)
BKR WAM PLATELETS: 206 10*3/uL (ref 150–420)
BKR WAM RDW-CV: 12.4 % (ref 11.0–15.0)
BKR WAM RDW-CV: 12.1 % (ref 11.0–15.0)
BKR WAM RED BLOOD CELL COUNT.: 4.6 M/ÂµL (ref 4.00–6.00)
BKR WAM RED BLOOD CELL COUNT.: 4.08 M/ÂµL (ref 4.00–6.00)
BKR WAM WHITE BLOOD CELL COUNT: 12.8 10*3/uL — ABNORMAL HIGH (ref 4.0–11.0)
BKR WAM WHITE BLOOD CELL COUNT: 19.8 10*3/uL — ABNORMAL HIGH (ref 4.0–11.0)

## 2024-02-24 LAB — PT/INR AND PTT (BH GH L LMW YH)
BKR INR: 0.89 — ABNORMAL LOW (ref 0.90–1.10)
BKR PARTIAL THROMBOPLASTIN TIME: 24.7 s (ref 23.0–31.0)
BKR PROTHROMBIN TIME: 9.7 s (ref 9.0–12.0)

## 2024-02-24 MED ORDER — FENTANYL (PF) 50 MCG/ML (WRAPPED ERX) INJECTION
50 | INTRAVENOUS | 1 refills | Status: DC | PRN
Start: 2024-02-24 — End: 2024-02-24

## 2024-02-24 MED ORDER — INSULIN LISPRO (U-100) 100 UNIT/ML SUBCUTANEOUS SOLUTION
100 | Freq: Once | SUBCUTANEOUS | Status: DC | PRN
Start: 2024-02-24 — End: 2024-02-24

## 2024-02-24 MED ORDER — HYDROCORTISONE 1 % TOPICAL CREAM
1 | Freq: Two times a day (BID) | TOPICAL | Status: DC | PRN
Start: 2024-02-24 — End: 2024-02-28

## 2024-02-24 MED ORDER — MORPHINE (PF) 1 MG/ML INJECTION SOLUTION
1 | Status: CP
Start: 2024-02-24 — End: ?

## 2024-02-24 MED ORDER — DEXAMETHASONE SODIUM PHOSPHATE 4 MG/ML INJECTION SOLUTION
4 | INTRAVENOUS | Status: DC | PRN
Start: 2024-02-24 — End: 2024-02-24
  Administered 2024-02-24: 02:00:00 4 mg/mL via INTRAVENOUS

## 2024-02-24 MED ORDER — ONDANSETRON HCL (PF) 4 MG/2 ML INJECTION SOLUTION
4 | Freq: Four times a day (QID) | INTRAVENOUS | Status: DC | PRN
Start: 2024-02-24 — End: 2024-02-28

## 2024-02-24 MED ORDER — SODIUM CHLORIDE 0.9 % (FLUSH) INJECTION SYRINGE
0.9 | INTRAVENOUS | Status: DC | PRN
Start: 2024-02-24 — End: 2024-02-24

## 2024-02-24 MED ORDER — DEXTROSE 10 % IV BOLUS FOR D10 & D50 ORDERABLE FOR HYPOGLYCEMIA (DROPS CHARGE)
INTRAVENOUS | Status: DC | PRN
Start: 2024-02-24 — End: 2024-02-24

## 2024-02-24 MED ORDER — SODIUM CHLORIDE 0.9 % (FLUSH) INJECTION SYRINGE
0.9 | Freq: Three times a day (TID) | INTRAVENOUS | Status: DC
Start: 2024-02-24 — End: 2024-02-24

## 2024-02-24 MED ORDER — MORPHINE (PF) 1 MG/ML INJECTION SOLUTION
1 | INTRATHECAL | Status: DC | PRN
Start: 2024-02-24 — End: 2024-02-24
  Administered 2024-02-24: 01:00:00 1 mg/mL via INTRATHECAL

## 2024-02-24 MED ORDER — BUPIVACAINE (PF) 0.75 % (7.5 MG/ML) IN 8.25 % DEXTROSE INJECTION
0.75 | INTRATHECAL | Status: DC | PRN
Start: 2024-02-24 — End: 2024-02-24
  Administered 2024-02-24: 01:00:00 0.75 % (7.5 mg/mL) via INTRATHECAL

## 2024-02-24 MED ORDER — IBUPROFEN 600 MG TABLET
600 | Freq: Four times a day (QID) | ORAL | Status: DC
Start: 2024-02-24 — End: 2024-02-28
  Administered 2024-02-25 – 2024-02-26 (×8): 600 mg via ORAL

## 2024-02-24 MED ORDER — FENTANYL (PF) 50 MCG/ML (WRAPPED ERX) INJECTION
50 | Status: CP
Start: 2024-02-24 — End: ?

## 2024-02-24 MED ORDER — OXYTOCIN IN NS (BOLUS FROM BAG)
10 | Freq: Once | INTRAVENOUS | Status: DC
Start: 2024-02-24 — End: 2024-02-25

## 2024-02-24 MED ORDER — MIDAZOLAM 1 MG/ML INJECTION SOLUTION
1 | INTRAVENOUS | Status: DC | PRN
Start: 2024-02-24 — End: 2024-02-24
  Administered 2024-02-24: 02:00:00 1 mg/mL via INTRAVENOUS

## 2024-02-24 MED ORDER — FENTANYL (PF) 50 MCG/ML (WRAPPED ERX) INJECTION
50 | INTRATHECAL | Status: DC | PRN
Start: 2024-02-24 — End: 2024-02-24
  Administered 2024-02-24: 01:00:00 50 ug/mL via INTRATHECAL

## 2024-02-24 MED ORDER — OXYTOCIN IN 0.9% SODIUM CHLORIDE 30 UNIT/500 ML INTRAVENOUS SOLUTION (POSTPARTUM)
30 | INTRAVENOUS | Status: DC
Start: 2024-02-24 — End: 2024-02-25
  Administered 2024-02-24: 06:00:00 30 mL/h via INTRAVENOUS

## 2024-02-24 MED ORDER — POLYETHYLENE GLYCOL 3350 17 GRAM ORAL POWDER PACKET
17 | Freq: Every evening | ORAL | Status: DC | PRN
Start: 2024-02-24 — End: 2024-02-28

## 2024-02-24 MED ORDER — FENTANYL (PF) 50 MCG/ML (WRAPPED ERX) INJECTION
50 | INTRAVENOUS | Status: DC | PRN
Start: 2024-02-24 — End: 2024-02-24
  Administered 2024-02-24: 02:00:00 50 ug/mL via INTRAVENOUS

## 2024-02-24 MED ORDER — CEFAZOLIN 1 GRAM SOLUTION FOR INJECTION
1 | INTRAVENOUS | Status: DC | PRN
Start: 2024-02-24 — End: 2024-02-24
  Administered 2024-02-24: 01:00:00 1 g via INTRAVENOUS

## 2024-02-24 MED ORDER — NALOXONE 0.4 MG/ML INJECTION SOLUTION
0.4 | INTRAVENOUS | Status: DC | PRN
Start: 2024-02-24 — End: 2024-02-24

## 2024-02-24 MED ORDER — DEXMEDETOMIDINE 100 MCG/ML INTRAVENOUS SOLUTION
100 | Status: CP
Start: 2024-02-24 — End: ?

## 2024-02-24 MED ORDER — MIDAZOLAM 1 MG/ML INJECTION SOLUTION
1 | Status: CP
Start: 2024-02-24 — End: ?

## 2024-02-24 MED ORDER — LACTATED RINGERS INTRAVENOUS SOLUTION
INTRAVENOUS | Status: DC
Start: 2024-02-24 — End: 2024-02-25

## 2024-02-24 MED ORDER — SENNOSIDES 8.6 MG TABLET
8.6 | Freq: Every day | ORAL | Status: DC | PRN
Start: 2024-02-24 — End: 2024-02-28
  Administered 2024-02-26: 12:00:00 8.6 mg via ORAL

## 2024-02-24 MED ORDER — PRENATAL VIT,CALCIUM 27-FERROUS FUM 60 MG IRON-FOLIC ACID 1 MG TABLET
60 | Freq: Every day | ORAL | Status: DC
Start: 2024-02-24 — End: 2024-02-28
  Administered 2024-02-24 – 2024-02-26 (×3): 60 mg iron-1 mg via ORAL

## 2024-02-24 MED ORDER — BENZOCAINE 20 %-MENTHOL 0.5 % TOPICAL AEROSOL
20-0.5 | Freq: Four times a day (QID) | TOPICAL | Status: DC | PRN
Start: 2024-02-24 — End: 2024-02-28

## 2024-02-24 MED ORDER — SIMETHICONE 125 MG CHEWABLE TABLET
125 | Freq: Four times a day (QID) | ORAL | Status: DC | PRN
Start: 2024-02-24 — End: 2024-02-28
  Administered 2024-02-24 – 2024-02-25 (×7): 125 mg via ORAL

## 2024-02-24 MED ORDER — ACETAMINOPHEN 1,000 MG/100 ML (10 MG/ML) INTRAVENOUS SOLUTION
10 | INTRAVENOUS | Status: DC | PRN
Start: 2024-02-24 — End: 2024-02-24
  Administered 2024-02-24: 02:00:00 10 mg/mL via INTRAVENOUS

## 2024-02-24 MED ORDER — KETOROLAC 30 MG/ML (1 ML) INJECTION SOLUTION
30 | INTRAVENOUS | Status: DC | PRN
Start: 2024-02-24 — End: 2024-02-24
  Administered 2024-02-24: 02:00:00 30 mg/mL via INTRAVENOUS

## 2024-02-24 MED ORDER — DIPHENHYDRAMINE 25 MG ORAL TAB/CAP (WRAPPED ERX)
25 | ORAL | Status: DC | PRN
Start: 2024-02-24 — End: 2024-02-28
  Administered 2024-02-25: 03:00:00 25 mg via ORAL

## 2024-02-24 MED ORDER — MODIFIED LANOLIN 100 % TOPICAL CREAM
100 | TOPICAL | Status: DC | PRN
Start: 2024-02-24 — End: 2024-02-28

## 2024-02-24 MED ORDER — BUPIVACAINE (PF) 0.25 % (2.5 MG/ML) INJECTION SOLUTION
0.25 | Status: CP
Start: 2024-02-24 — End: ?

## 2024-02-24 MED ORDER — ONDANSETRON HCL (PF) 4 MG/2 ML INJECTION SOLUTION
4 | INTRAVENOUS | Status: DC | PRN
Start: 2024-02-24 — End: 2024-02-24
  Administered 2024-02-24: 01:00:00 4 via INTRAVENOUS

## 2024-02-24 MED ORDER — KETAMINE 50 MG/ML INJECTION SOLUTION
50 | Status: CP
Start: 2024-02-24 — End: ?

## 2024-02-24 MED ORDER — KETAMINE 50 MG/ML INJECTION SOLUTION
50 | INTRAVENOUS | Status: DC | PRN
Start: 2024-02-24 — End: 2024-02-24
  Administered 2024-02-24: 02:00:00 50 mg/mL via INTRAVENOUS

## 2024-02-24 MED ORDER — METOCLOPRAMIDE 5 MG/ML INJECTION SOLUTION
5 | Freq: Four times a day (QID) | INTRAVENOUS | Status: DC | PRN
Start: 2024-02-24 — End: 2024-02-28

## 2024-02-24 MED ORDER — METHYLPREDNISOLONE ACETATE 40 MG/ML SUSPENSION FOR INJECTION
40 | Status: CP
Start: 2024-02-24 — End: ?

## 2024-02-24 MED ORDER — LACTATED RINGERS INTRAVENOUS SOLUTION
INTRAVENOUS | Status: DC
Start: 2024-02-24 — End: 2024-02-24
  Administered 2024-02-24: 01:00:00 1000.000 mL/h via INTRAVENOUS

## 2024-02-24 MED ORDER — MEPERIDINE 25 MG/2.5 ML IN 0.9% SODIUM CHLORIDE
INTRAVENOUS | 1 refills | Status: DC | PRN
Start: 2024-02-24 — End: 2024-02-24

## 2024-02-24 MED ORDER — COPPER 380 SQUARE MM INTRAUTERINE DEVICE
380 | Freq: Once | INTRAUTERINE | Status: DC
Start: 2024-02-24 — End: 2024-02-28

## 2024-02-24 MED ORDER — KETOROLAC 15 MG/ML INJECTION SOLUTION
15 | Freq: Four times a day (QID) | INTRAVENOUS | Status: CP
Start: 2024-02-24 — End: ?
  Administered 2024-02-24 (×3): 15 mL via INTRAVENOUS

## 2024-02-24 MED ORDER — ONDANSETRON HCL (PF) 4 MG/2 ML INJECTION SOLUTION
4 | INTRAVENOUS | Status: DC | PRN
Start: 2024-02-24 — End: 2024-02-24

## 2024-02-24 MED ORDER — OXYCODONE IMMEDIATE RELEASE 5 MG TABLET
5 | ORAL | Status: DC | PRN
Start: 2024-02-24 — End: 2024-02-28
  Administered 2024-02-24 – 2024-02-25 (×2): 5 mg via ORAL

## 2024-02-24 MED ORDER — ONDANSETRON 4 MG DISINTEGRATING TABLET
4 | Freq: Four times a day (QID) | ORAL | Status: DC | PRN
Start: 2024-02-24 — End: 2024-02-28

## 2024-02-24 MED ORDER — HYDROMORPHONE 2 MG/ML INJECTION SOLUTION
2 | INTRAVENOUS | 1 refills | Status: DC | PRN
Start: 2024-02-24 — End: 2024-02-24

## 2024-02-24 MED ORDER — GLYCERIN-WITCH HAZEL 12.5 %-50 % TOPICAL PADS
12.5-50 | TOPICAL | Status: DC | PRN
Start: 2024-02-24 — End: 2024-02-28

## 2024-02-24 MED ORDER — ACETAMINOPHEN 1,000 MG/100 ML (10 MG/ML) INTRAVENOUS SOLUTION
10 | Status: CP
Start: 2024-02-24 — End: ?

## 2024-02-24 MED ORDER — DEXAMETHASONE SODIUM PHOSPHATE (PF) 10 MG/ML INJECTION SOLUTION
10 | Status: CP
Start: 2024-02-24 — End: ?

## 2024-02-24 MED ORDER — LACTATED RINGERS INTRAVENOUS SOLUTION
INTRAVENOUS | Status: DC | PRN
Start: 2024-02-24 — End: 2024-02-24
  Administered 2024-02-24: 01:00:00 via INTRAVENOUS

## 2024-02-24 MED ORDER — GLUCAGON 1 MG/ML IN STERILE WATER
Freq: Once | INTRAMUSCULAR | Status: DC | PRN
Start: 2024-02-24 — End: 2024-02-24

## 2024-02-24 MED ORDER — ACETAMINOPHEN 325 MG TABLET
325 | Freq: Four times a day (QID) | ORAL | Status: DC
Start: 2024-02-24 — End: 2024-02-28
  Administered 2024-02-24 – 2024-02-26 (×12): 325 mg via ORAL

## 2024-02-24 MED ORDER — ZZ IMS TEMPLATE
Freq: Once | ORAL | Status: CP
Start: 2024-02-24 — End: ?
  Administered 2024-02-24: 22:00:00 50 mg via ORAL

## 2024-02-24 MED ORDER — OXYTOCIN 10 UNIT/ML INJECTION SOLUTION
10 | Freq: Once | INTRAMUSCULAR | Status: DC
Start: 2024-02-24 — End: 2024-02-25

## 2024-02-24 MED ORDER — OXYCODONE (ROXICODONE) IMMEDIATE RELEASE 2.5 MG HALFTAB
2.5 | ORAL | Status: DC | PRN
Start: 2024-02-24 — End: 2024-02-28

## 2024-02-24 MED ORDER — OXYTOCIN IN 0.9% SODIUM CHLORIDE 30 UNIT/500 ML INTRAVENOUS SOLUTION (POSTPARTUM)
30 | INTRAVENOUS | Status: DC | PRN
Start: 2024-02-24 — End: 2024-02-24
  Administered 2024-02-24: 02:00:00 30 mL/h via INTRAVENOUS

## 2024-02-24 MED ORDER — PHENYLEPHRINE FOR ANESTHESIA
INTRAVENOUS | Status: DC | PRN
Start: 2024-02-24 — End: 2024-02-24
  Administered 2024-02-24: 01:00:00 250.000 mL/h via INTRAVENOUS

## 2024-02-24 MED ORDER — OXYTOCIN 10 UNIT/ML INJECTION SOLUTION
10 | Status: DC
Start: 2024-02-24 — End: 2024-02-28

## 2024-02-24 MED ORDER — METOCLOPRAMIDE 10 MG TABLET
10 | Freq: Four times a day (QID) | ORAL | Status: DC | PRN
Start: 2024-02-24 — End: 2024-02-28
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
## 2024-02-24 NOTE — Operative Note [1000004]
 [facility]	 [facility]	Operative ReportPatient Data:  Patient Name: Mariah Ferguson Age: 29 y.o. DOB: 1995/12/19	 MRN: [id number]	 DATE OF PROCEDURE/SURGERY: 2/1/2026OPERATION: Procedure(s) and Anesthesia Type:   * CESAREAN DELIVERY ONLY - SPINALINDICATIONS: 28 y.o. G1P0101 at [redacted]w[redacted]d with NRFHT remote from delivery, sFGR (w elevated UAD), ghtn. Decision made to proceed with pCS given fetus non responsive to fluid bolus, repositioning and continued recurrent decelerations.SURGEONS: Surgeons and Role:   * Seaman, Vernell Degree, MD - Primary: OR STAFF: Circulator: Vanetta Pellet, RN; Codner, McDonald Chapel, RNScrub Person: Claudene Plummer: Reighn Kaplan Eleanor, MD ANESTHESIA: spinalPREOP DIAGNOSIS: preterm pregnancy with nrFHT remote from deliveryPOSTOP DIAGNOSIS: SameESTIMATED BLOOD LOSS:  500ccFLUIDS REPLACED:  2000cc crystalloidURINE OUTPUT:  325cc CYUCOMPLICATIONS: NoneFINDINGS:  -- Pfannenstiel incision-- Low transverse hysterotomy -- Female infant, Apgars 8/9, weight 1220g, cephalic presentation-- Clear amniotic fluid-- Placenta delivered spontaneously-- Uterus closed in one layer-- Cu-IUD placed prior to hysterotomy closure-- Excellent hemostasis-- Cervical exam with redudant anterior cervical tissue, hemostatic, no visible defectPROCEDURE: After informed consent, patient brought to OR and placed on table in dorsal supine position with leftward tilt and SCDs on lower extremities. Time out identified correct patient and procedure. Spinal anesthesia induced and adequate. FHR checked and normal. Patient prepared and sterilely draped. Allis test confirmed adequate anesthesia. Surgical pause performed. Pfannenstiel skin incision made and carried down with electrocautery to fascia. Fascia knicked sharply on either side of midline and extended laterally with curved Mayo scissors. Superior aspect of fascia tented anteriorly with Kocher clamps and dissected sharply and bluntly from underlying rectus muscles. Muscles separated in the midline bluntly. Peritoneum identified, entered bluntly, and extended with blunt stretching. Bladder blade inserted. Low transverse uterine incision made, arching superiorly at lateral aspects, and extended bluntly with cephalocaudal traction. Infant's head brought to hysterotomy and delivered atraumatically with fundal pressure. Cord clamped after 60 second delay and cut. Infant handed to waiting pediatricians. Intact placenta delivered spontaneously with fundal massage. Uterus left in situ and cleared of all clot and debris with moist sponges. Hysterotomy closed with 0 Monocryl in running locked fashion to achieve excellent hemostasis. Prior to complete hysterotomy closure a copper  IUD was placed in typical fashion at the fundus using ring forceps. Abdomen examined and hemostatic and closure completed. Uterus noted to be firm. Fascia closed with 0 Vicryl in running fashion starting at left angle and running to right angle. Subcutaneous tissue copiously irrigated with warm normal saline. Small areas of subcutaneous bleeding cauterized. Subcutaneous layer not needing to be closed with suture. Skin reapproximated with 4-0 Monocryl in subcuticular fashion. At end of procedure patient placed in dorsal lithotomy position for cervical examination iso recent cerclage removal. Bivalve speculum placed in the vagina. Cervix visually fingertip with minimal slow oozing. Redundant anterior cervical tissue seen between 9 o'clock to 11 o'clock however hemostatic without visible defect. Speculum removed.Patient tolerated procedure well. Sponge, lap, and needle counts correct x2, confirmed by RF wanding. Patient and infant taken to the Recovery Room in stable condition.Dr. Conrad was present and scrubbed for all key portions of procedure. Dr. Von attending the cervical exam at the end of the case due to floor acuity.Pathology: placentaCopper IUD (Paraguard)-LOT [id number]-Expiration Aug 2027Grace Yuepheng Schaller, [place]D.Obstetrics, Gynecology & Reproductive SciencesPGY-2[facility]2/02/2024

## 2024-02-24 NOTE — PACU Transfer of Care [100004]
 Post Anesthesia Transfer of Care NotePatient: Mariah HoodProcedure(s) Performed: * No procedures listed *Last Vitals: I have reviewed the post-operative vital signs during the handoff as noted in the Epic chart.POSTOP HANDOFF :      Patient Location:  PACU     Level of Consciousness:  Awake     VS stable since last recorded intra-op set? Yes       Oxygen source: room airPatient co-morbidities, intra-operative course, intake & output and antibiotics as per Anesthesia record were discussed with the RN.

## 2024-02-24 NOTE — Progress Notes [1]
 Patient's primary language: EnglishInterpreter used N/AEnrolled in MITEY program (Maternal In-Reach Team for [facility] at [facility]) for management of HTN.Hospital CarePatient enrolled in the MITEY programReceived education packet, warning signs document, calling precautions, and educated in blood pressure cuff usePatient using own blood pressure cuffShe has a cuff homeCuff size regIf patient has their own cuff was the accuracy and ability tested: Yes Educated about logging values via Mychart: Yes Orders placed: Referral to MITEY ProgramHome managementPatients will take BP at home twice a day for 5 days a week (once in AM and once in PM)Patient instructed to call for the following per warning signs document:Warning Signs:Persistent headache that does not improve after TylenolChanges in your vision, including black spots or blurry visionPersistent pain on the upper right portion of your abdomenPersistent shortness of breath, trouble breathing, or chest painCall for:	Systolic blood pressure >140 (top number)Diastolic blood pressure >90 (bottom number)Call and Present for urgent care if blood pressure:Systolic blood pressure >160 (top number)Diastolic blood pressure >110 (bottom number) Questions were addressed and patient shows good understandingElectronically Signed by Germain Record, APRN, February 24, 2024

## 2024-02-24 NOTE — Plan of Care [1000001]
Plan of Care Overview/ Patient Status    I have reviewed the use of Pasteurized Donor Human Milk (PDHM) with the parent, and answered any questions. The parent wishes to proceed with using PDHM for their newborn during this hospital stay. Pasteurized Donor Breast Milk Handout [id number]  was not given.

## 2024-02-24 NOTE — Evaluation [3041234]
 Evaluation NoteSubjectiveObjectiveVitalsVitals:  02/23/24 0010 02/23/24 1000 02/23/24 1404 02/23/24 1739 BP: 131/83 112/77 103/65 138/85 Pulse: 87 68 (!) 107 70 Resp: 18 18 18 17  Temp: 98.8 ?F (37.1 ?C) 98 ?F (36.7 ?C) 98.2 ?F (36.8 ?C) 98.4 ?F (36.9 ?C) TempSrc: Temporal Temporal Temporal Temporal SpO2: 97% 97% 99% 100% Weight:     Height:     Exam:SVE: UnchnagedFHT: 150/ minimal to moderate/ rare accels/ + decels 2 min prolonged at 1917, + variablesAssessment and PlanRoysheka Ferguson is a 29 y.o. G1P0000 @ [redacted]w[redacted]d admitted for nrFHT in the s/o severe FGR with elevated UAD. Additionally, patient has been monitored for threatened PTL, but SVE has been stable with 2cm dilation (last checked 2/1) and is s/p nifedipine  toco course. Given NRFHT remote from delivery, plan for pCS. Patient consented. Charge aware. Will move to OR as able.Plan for respositioning and fluid bolus in meantime.Falicity Sheets, M.D.Obstetrics, Gynecology & Reproductive SciencesPGY-2[facility]2/01/2024

## 2024-02-24 NOTE — Plan of Care [1000001]
 Plan of Care Overview/ Patient Status1500-1900: Day 1 C/S. VSS. Postpartum assessment per flow sheet. Incisional bandage on - CDI. Passing flatus. Voiding trial in process - encouraged voiding every 2-3 hours. Encouraged increasing ambulation. See MAR for pain regimen. Encouraged pumping every 3 hours to protect milk supply . Social work consult pending for support-  baby in NICU. Diflucan  x1 for yeast infection. Continue with current POC

## 2024-02-24 NOTE — Anesthesia Postprocedure Evaluation [25]
 Anesthesia Post-op NotePatient: Mariah HoodProcedure(s):  * No procedures listed * Last Vitals:  I have reviewed the post-operative vital signs as noted in the Epic chart.POSTOP EVALUATION:      Patient Recovery Location:  PACU     Vital Signs Status:  Stable     Patient Participation:  Patient participated     Mental Status:  Awake     Respiratory Status:  Acceptable     Airway Patency:  Patent     Cardiovascular/Hydration Status:  Stable     Pain Management:  Satisfactory to patient     Nausea/Vomiting Status:  Satisfactory to patientThere were no known notable events for this encounter.

## 2024-02-24 NOTE — Anesthesia Procedure/Diagnosis Confirmation Note [112001]
 Operative Diagnosis:Pre-op:   * No pre-op diagnosis entered * Patient Coded Diagnosis   None  Patient Diagnosis   None    * No Diagnosis Codes entered *Operative Procedure(s) :* No procedures listed *Post-op Procedure & Diagnosis ConfirmationPost-op Diagnosis: Post-op Diagnosis updated (see notes)     - LaborPost-op Procedure: Post-op Procedure updated (see notes)     - Caesarean Section

## 2024-02-24 NOTE — Plan of Care [1000001]
 Plan of Care Overview/ Patient StatusProblem: Adult Inpatient Plan of CareGoal: Plan of Care ReviewOutcome: Interventions implemented as appropriateFlowsheets (Taken 02/24/2024 0032)Progress: improvingPlan of Care Reviewed With: patient spouseGoal: Patient-Specific Goal (Individualized)Outcome: Interventions implemented as appropriateFlowsheets (Taken 02/24/2024 0024)What Anxieties, Fears, Concerns or Questions Do You Have About Your Care?: baby in NNICUIndividualized Preferences and Care Needs: cluster carePatient Centered Long Term Goal: stable PP transitionPatient Centered Daily Goal: rest and pain control Pt is a 29 year old G1P1 s/p C/S admitted to [facility] 8 in apparent stable condition from L&D.  Transferred easily to bed without incident.  Pt and FOB oriented to room, surroundings, staff phones, menu, remote, and safety protocols for mother.  IV infusing LR and pitocin  as ordered. Foley draining clear, yellow urine. Pt received Tylenol  and oxy for discomfort before transfer.  Pt plans to breastfeed. Tolerating regular diet.  Progressing toward goals.  Follow updated IPOC.

## 2024-02-24 NOTE — Plan of Care [1000001]
 Plan of Care Overview/ Patient StatusProblem: Adult Inpatient Plan of CareGoal: Plan of Care ReviewFlowsheetsTaken 02/24/2024 1458Progress: improvingTaken 02/24/2024 0750Plan of Care Reviewed With: patient significant otherGoal: Patient-Specific Goal (Individualized)Flowsheets (Taken 02/24/2024 0750)What Anxieties, Fears, Concerns or Questions Do You Have About Your Care?: Infant in NNICU, pain management, will ask as they ariseIndividualized Preferences and Care Needs: Pumping, cluster care, visiting infant in NNICUPatient Centered Long Term Goal: Stable postpartum recoveryPatient Centered Daily Goal: Rest, pain management, bonding with infantPatient is one day s/p C/S to baby boy at 32+3, infant in NNICU r/t prematurity. Vitals stable and assessment as per flowsheet. Endorsed SOB x1 related to anxiety, Fields APRN notifed. Vitals stable and will CTM, no new orders. BP remains normotensive, Nifedipine  given as per order. Bleeding and fundal checks within normal limits. Incision dressing CDI, no drainage noted. Out of bed independently. Foley removed at 1200, due to void. IV saline locked. Passing gas, denies s/s of constipation. Mylicon given for support. Pain is well controlled with Toradol  and Tylenol . Breast pump available at the bedside, encouraged to call for assistance as needed. Lactation consult in place. Social work involved in care. Family is supportive at the bedside and participating in care. Education ongoing, continue with current plan of care.

## 2024-02-25 ENCOUNTER — Inpatient Hospital Stay: Admit: 2024-02-25 | Payer: PRIVATE HEALTH INSURANCE | Primary: Obstetrics and Gynecology

## 2024-02-25 LAB — URINE CULTURE: BKR URINE CULTURE, ROUTINE: >=100000 — AB

## 2024-02-25 NOTE — Plan of Care [1000001]
 Plan of Care Overview/ Patient StatusSOCIAL WORK NOTEPatient Name: Mariah Ferguson Record Number: [id number] Date of Birth: 1997-09-03Medical Social Work Follow Up  [employer] Most Recent Value Admission Information  Document Type Clinical Assessment - Able to Assess (For Inpatient/ED Only) Prior psychosocial assessment has been documented within this hospitalization I have reviewed and agree with the assessment of...SABRASABRASABRA Agree Prior Assessment Date 02/20/24 (For Inpatient/ED Only) Prior psychosocial assessment has been documented within 30 days of this hospitalization Yes, I have reviewed the most recent assessment and entered updates 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 Face-to-Face / In-Person Psychosocial issues requiring intervention Social work consult for NNICU admission Psychosocial interventions 30 minutes were spent in direct contact with Mariah Ferguson. I introduced myself and the role of social work on the Neonatal Intensive Care Unit (NNICU). Mariah Ferguson is a 29 year old female who is postop day 2 from cesarean section to Mariah Ferguson at 32 weeks, 3 days gestation. Mariah Ferguson presents to the NNICU for observation for prematurity. I provided active listening and supportive counseling while discussing NNICU admission. An assessment was completed by Mia, LCSW on 02/20/2024. Mariah Ferguson resides with her partner Mariah Ferguson. She is was recently employed as a LAWYER.I provided psychoeducation on postpartum mood and anxiety disorders and ways to seek help. I additionally provided the family with a list of resources related to the NNICU admission including information on parking, accommodations, financial assistance, insurance, and community resources. She was encouraged to reach out regarding any questions or concerns. She agreed for me to make a Cabin [employer] referral. Collaborations Patient, medical team Specific referrals to enhance community supports (include existing and new resources) Referral was made to [employer], Post Partum information Handoff Required? No Social Work will take lead to arrange post-acute care services and continue work with the care team as patient progresses towards discharge No Next Steps/Plan (including hand-off): Social work will continue to provide support throughout NNICU Admission Signature: Lorane Duos, [place] Contact Information: [phone]

## 2024-02-25 NOTE — Lactation Note [42]
 BREAST PUMP:The patient met with the [employer] pump coordinator on 2.2.26 who offered a choice of DME provider and demonstrated the pumps covered by the patients insurance.  The patient was then given a double electric breast pump.  For questions call [employer] at [phone].  [employer]/product Designer.

## 2024-02-26 ENCOUNTER — Encounter
Admit: 2024-02-26 | Payer: PRIVATE HEALTH INSURANCE | Attending: Maternal & Fetal Medicine | Primary: Obstetrics and Gynecology

## 2024-02-26 DIAGNOSIS — Z006 Encounter for examination for normal comparison and control in clinical research program: Principal | ICD-10-CM

## 2024-02-26 MED ORDER — LIDOCAINE 4 % TOPICAL PATCH
4 | MEDICATED_PATCH | TRANSDERMAL | 1 refills | Status: AC
Start: 2024-02-26 — End: ?

## 2024-02-26 MED ORDER — ACETAMINOPHEN 325 MG TABLET
325 | ORAL_TABLET | Freq: Four times a day (QID) | ORAL | 1 refills | Status: AC
Start: 2024-02-26 — End: ?

## 2024-02-26 MED ORDER — NIFEDIPINE ER 30 MG TABLET,EXTENDED RELEASE 24 HR
30 | ORAL_TABLET | Freq: Every day | ORAL | 2 refills | Status: AC
Start: 2024-02-26 — End: ?

## 2024-02-26 MED ORDER — OXYCODONE IMMEDIATE RELEASE 5 MG TABLET
5 | ORAL_TABLET | ORAL | 1 refills | Status: AC | PRN
Start: 2024-02-26 — End: ?

## 2024-02-26 MED ORDER — SENNOSIDES 8.6 MG TABLET
8.6 | ORAL_TABLET | Freq: Every day | ORAL | 1 refills | Status: AC | PRN
Start: 2024-02-26 — End: ?

## 2024-02-26 MED ORDER — IBUPROFEN 600 MG TABLET
600 | ORAL_TABLET | Freq: Four times a day (QID) | ORAL | 1 refills | Status: AC
Start: 2024-02-26 — End: ?

## 2024-02-26 MED ORDER — POLYETHYLENE GLYCOL 3350 17 GRAM ORAL POWDER PACKET
17 | Freq: Every evening | ORAL | 1 refills | Status: AC | PRN
Start: 2024-02-26 — End: ?

## 2024-02-26 MED ORDER — SIMETHICONE 125 MG CHEWABLE TABLET
125 | ORAL_TABLET | Freq: Four times a day (QID) | ORAL | 1 refills | Status: AC | PRN
Start: 2024-02-26 — End: ?

## 2024-02-26 NOTE — Plan of Care [1000001]
 Plan of Care Overview/ Patient StatusY[facility]  Hospital-YscSpiritual Care NoteAssessment:  Religion:BaptistRoye shared the progression of the delivery. She expressed her initial worries and her current joy. She talked about the adjustment to her surgery and Mariah Ferguson's hospitalization. She reflected upon God's presence throughout this time with her. She shared family concerns and certain dynamics that she didn't want to affect her and her child. She welcomed prayer for Mariah Ferguson. I went to NICU and prayed for Mariah Ferguson.Intervention:  Referral Source: Chaplain Initiated, PatientResponding Chaplain: Unit ChaplainLanguage or Special Accommodation Rendered?: NoVisit and Intervention Type: Spiritual Visit, Follow-up Spiritual care interventions provided: Cultural, Religious or Spiritual Resources Interventions: : PrayerRelational or Interpersonal Interventions: : Spiritual Support/Presence, Active Listening and or Reflection, EmpathyMeaning-Making Interventions: : Life Review and/or Story Listening, Facilitation of Spiritual Reflection/Meaning-Making Outcome: With the help of the chaplain, patient/loved one(s): Relational or Interpersonal Outcomes:: Expressed Emotions for Catharsis, Felt Greater Comfort and/or SupportMeaning-Making Outcomes:: Made Progress Toward Spiritual/Emotional Growth Plan: Follow-Up Visit Needed: YesFollow up needed, family appreciates support during patient's hospitalization.Below is a list of common reasons to consult us  for support:  N: new diagnosis E: emotional/spiritual distress E: existential distress D: decision making/goals of care meetingsS: support for staff and patients' loved ones   C: compromised copingA: anxiety/stress/grief/loneliness R: religious/cultural/ritual needsE: end-of-life care/death/dying Rev. Ahmia Colford De SilvaStaff Chaplain, Pediatrics and Maternity2/04/2024 5:30 PM

## 2024-02-26 NOTE — Progress Notes [1]
 Obstetrics - Post Cesarean Section Progress Note HPI: 29 y.o. G1P0101 POD#2 (2/1) s/p pltcs at [redacted]w[redacted]d NRFHT remote from delivery + cuIUD. Doing well this AM. Minimal pain and VB. No nausea or dizziness. Voiding and passing gas. Went to NNICU to see baby and got yo hold him. Objective: Vital signs:Patient Vitals for the past 24 hrs: BP Temp Temp src Pulse Resp SpO2 02/25/24 0833 118/79 98.1 ?F (36.7 ?C) Oral (!) 92 16 100 % 02/25/24 0554 124/86 97.6 ?F (36.4 ?C) Oral 85 17 100 % 02/25/24 0031 121/85 98 ?F (36.7 ?C) Oral 82 17 99 % 02/24/24 1906 117/79 98.5 ?F (36.9 ?C) -- (!) 92 16 97 % 02/24/24 1540 123/87 98.1 ?F (36.7 ?C) -- (!) 92 18 98 % 02/24/24 1425 131/83 98.3 ?F (36.8 ?C) Oral (!) 93 17 97 % 02/24/24 1202 135/86 98 ?F (36.7 ?C) -- 77 18 98 % I/Os:I/O Last 24H Gross Totals (Last 24 hours) at 02/25/2024 1053Last data filed at 02/24/2024 2301Intake -- Output 1200 ml Net -1200 ml Physical Exam: General: NAD Pulm: NWOBAbdomen: Soft, appropriately tender, softly distendedIncision: Steris c/d/iUterine Fundus: firm, appropriately tender, below umbilicus LE: Warm and well perfused, without edema, erythema, or cordsReview of Labs:Recent Labs Lab 01/31/260928 02/01/261943 02/02/260528 WBC 10.0 12.8* 19.8* HGB 13.2 14.2 12.8 HCT 39.60 41.40 36.70 MCV 91.2 90.0 90.0 PLT 254 256 206  Assessment & Plan: 29 y.o. G1P0101 POD#2 s/p pltcs at [redacted]w[redacted]d NRFHT remote from delivery + cuIUD, doing well postoperatively but not yet meeting all postop milestones appropriately. VS HDS. AM labs stable. No PEC symptoms reported, rare MR BP yesterday, normotensive now. Routine Postpartum Care-Antepartum Hct 41.6  --> EBL 500 cccc --> PP Hct 36.7-Pain: tylenol / motrin , oxycodone  prn -Regular diet-Voiding spont-Rh positive-Rubella immune-Feeding: breast/ donor milk-Birth control: s/p Copper  IUD-Circumcision: desires [] consent [] completeOther Active IssuesgHTN-adm PEC labs wnl-1/31 Hct 39.6, Plt 254, Cr 0.72, Alt 38, Ast 32, Pr:Cr 0.2-Curr: Nifed 30mg  XL 1/29-MITEY [x] -- has cuff at [facility] pain/rib pain, resolved-Left flank -> resolved with flexeril -1/31 Renal u/s wnl, UA wnlCandida vaginitis-s/p diflucan  x 1Dispo- Likely d/c home POD#4 pending attending approvalLeslie CINDERELLA Griffiths, MD2/3/2026MFM AttdI have seen and examined the patient. I have reviewed the noted, assessments, and/or procedures performed by the resident. I concur with the documentation and have participated in the management of and planning for her.  I add the following: Patient Active Problem List Diagnosis  Hydrosalpinx  Right ovarian cyst  PCOS (polycystic ovarian syndrome)  Pre-diabetes  Left ovarian cyst  Supervision of normal first pregnancy, antepartum  Short cervical length during pregnancy in second trimester  Shirodkar cerclage with TWO SUTURES PRESENT  Fetal growth restriction antepartum  Uterine contractions  Gestational hypertension, third trimester  S/P cesarean section  Delivery of pregnancy by cesarean section  MITEY PROGRAM No complaints. Tired, a lot has gone on in the past few days. +flatus tol dietBP 126/76 Comment: before procardia  - Pulse (!) 95  - Temp 98.1 ?F (36.7 ?C) (Oral)  - Resp 16  - Ht 5' (1.524 m)  - Wt 47 kg  - LMP 07/11/2023 (Exact Date)  - SpO2 100%  - Breastfeeding Unknown  - BMI 20.24 kg/m? Exam as aboveInc: cdiPOD#2 PLTCS EGA [redacted]w[redacted]d doing wellRemainder as aboveElectronically Signed by Grayce Ollis Frock, MD, February 26, 2024

## 2024-02-26 NOTE — Progress Notes [1]
 HIC # [id number] - EMPOWER: Trauma-informed approach to timely detection and management of early postpartum hypertensionYale School of Public HealthSponsor: PCORI ([facility])Role Name Email/In-Basket Phone Number Dual Principal Investigator Kellie Mems, PhD [email or web address] [phone] Dual Principal Investigator Heather  Wenceslao, MD, MS [email or web address]  Co-Investigators Comer Shams, MDAnnalies Norberta, MD In-Basket  Lead Study Doula/Postpartum Care Coordinator Carmell Garter, MSW [email or web address] [phone] Lead Study Coordinator Samual GEANNIE Louder, MPH [phone] EMPOWER Research Team Phone #: [phone] Doula Team Phone # [phone] The study team met with Eliberto Plater on 02/26/2024 to discuss details of the EMPOWER Study. The EMPOWER study aims to: improve clinical and mental health outcomes among patients who just delivered their baby and who may be at risk for hypertension, mental health, and/or cardiovascular complications. Enrolled participants will receive a blood pressure cuff to keep along with instructions on how to use it. Participants will be asked to take their blood pressure at baseline and complete a survey. After discharge from their delivery admission with [facility], enrolled participants receive remote blood pressure monitoring with MITEY as well as virtual appointments with a doula for up to 12-weeks postpartum. The study team presented the protocol rationale, study design, alternatives (including options not related to study participation), the rights of participants, and potential risks and benefits. In addition, we discussed that participation is voluntary and that all participants can withdraw at any time without penalty. Farida Klosinski expressed understanding of the information presented and all questions were answered. Lisandra Mathisen affirmed interest in participating and signed the consent form. she received a copy of the consent form to take home. Ravyn Ulysse also received a standard sized study Omron blood pressure cuff.Electronically Signed by Sonny Daring, February 26, 2024

## 2024-02-26 NOTE — Plan of Care [1000001]
 Plan of Care Overview/ Patient StatusPatient is PP day 3 s/p C/S. PP assessment WDL, see flowsheet for details. Enrolled in MITEY for GHTN, on procardia  daily. Voiding spontaneously and ambulating without difficulty. Pain controlled with scheduled Tylenol  and Motrin . Pumping for NB in NNICU. Incision steris have scant serosanguinous drainage, D/I. +BS and passing flatus. Education provided and questions answered as they arise. FOB supportive at bedside. Assistance provided as needed. ID bands verified with MAR. POC continued.  Problem: Adult Inpatient Plan of CareGoal: Optimal Comfort and WellbeingOutcome: Interventions implemented as appropriate Problem: Postpartum (Cesarean Delivery)Goal: Optimal Pain Control and FunctionOutcome: Interventions implemented as appropriate Problem: Postpartum (Cesarean Delivery)Goal: Effective Urinary EliminationOutcome: Interventions implemented as appropriate Problem: WoundGoal: Optimal CopingOutcome: Interventions implemented as appropriate Problem: WoundGoal: Improved Oral IntakeOutcome: Interventions implemented as appropriate Problem: WoundGoal: Optimal Pain Control and FunctionOutcome: Interventions implemented as appropriate

## 2024-02-27 NOTE — Plan of Care [1000001]
 Problem: Adult Inpatient Plan of CareGoal: Plan of Care ReviewOutcome: Interventions implemented as appropriate Problem: Adult Inpatient Plan of CareGoal: Patient-Specific Goal (Individualized)Outcome: Interventions implemented as appropriate Problem: Adult Inpatient Plan of CareGoal: Absence of Hospital-Acquired Illness or InjuryOutcome: Interventions implemented as appropriate Problem: Adult Inpatient Plan of CareGoal: Optimal Comfort and WellbeingOutcome: Interventions implemented as appropriate Problem: Adult Inpatient Plan of CareGoal: Readiness for Transition of CareOutcome: Interventions implemented as appropriate Problem: Postpartum (Cesarean Delivery)Goal: Successful Parent Role TransitionOutcome: Interventions implemented as appropriate Problem: Postpartum (Cesarean Delivery)Goal: HemostasisOutcome: Interventions implemented as appropriate Problem: Postpartum (Cesarean Delivery)Goal: Effective Bowel EliminationOutcome: Interventions implemented as appropriate Problem: Postpartum (Cesarean Delivery)Goal: Fluid and Electrolyte BalanceOutcome: Interventions implemented as appropriate Problem: Postpartum (Cesarean Delivery)Goal: Absence of Infection Signs and SymptomsOutcome: Interventions implemented as appropriate Problem: Postpartum (Cesarean Delivery)Goal: Anesthesia/Sedation RecoveryOutcome: Interventions implemented as appropriate Problem: Postpartum (Cesarean Delivery)Goal: Optimal Pain Control and FunctionOutcome: Interventions implemented as appropriate Problem: Postpartum (Cesarean Delivery)Goal: Effective Urinary EliminationOutcome: Interventions implemented as appropriate Problem: Postpartum (Cesarean Delivery)Goal: Effective Oxygenation and VentilationOutcome: Interventions implemented as appropriate Problem: WoundGoal: Optimal Wound HealingOutcome: Interventions implemented as appropriate Problem: BreastfeedingGoal: Effective BreastfeedingOutcome: Interventions implemented as appropriate Plan of Care Overview/ Patient StatusPPD 4 s/p c-section. See flow sheet fir vs & assessment. Voiding spontaneously & passing flatus. Incision c/d/I w/ steri strips. Pain well managed w/ Motrin  & Tylenol  as scheduled ATC. Visiting the baby in NNICU. Pumping for stimulation & milk supply. Progressing towards desired goals. Anticipate d/c home today. Continue POC & provide support as needed.

## 2024-02-27 NOTE — Plan of Care [1000001]
 Plan of Care Overview/ Patient Status    Postpartum assessments WNL, vitals stable, visiting baby in NBICU and using electric breast pump

## 2024-02-27 NOTE — Lactation Note [42]
 Lactation note: LC at bedside for follow up visit. Medications reviewed. Baby is in NICU currently gavage  feedings.  Refer to NICU flow sheet for infant's output, weight loss and TCB.Pt continuing to use Medela symphony pump every 3 hrs. with yield 20-77ml's  and  18 mm flanges noted to be comfortable. Enc to start using maintain setting on pump now that supply is >36mls x 3 - pt verbalized Pt is using track my milk. NICU breast milk storage guidelines reviewed, pt aware to bring all breast milk to NICU until advised by staff otherwise. Pt reports breasts to be filling, non tender and nipples intact. Mother has no questions or concerns for LC at this time. NICU [facility] team will continue to follow for support.

## 2024-02-27 NOTE — Plan of Care [1000001]
 Plan of Care Overview/ Patient StatusSOCIAL WORK NOTEPatient Name: Mariah Ferguson Record Number: [id number] Date of Birth: September 15, 1997Medical Social Work Follow Up  [employer] Most Recent Value Admission Information  Document Type Progress Note (For Inpatient/ED Only) Prior psychosocial assessment has been documented within this hospitalization I have reviewed and agree with the assessment of...SABRASABRASABRA Agree Prior Assessment Date 02/20/24 (For Inpatient/ED Only) Prior psychosocial assessment has been documented within 30 days of this hospitalization Yes, I have reviewed the most recent assessment and entered updates 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 Face-to-Face / In-Person Psychosocial issues requiring intervention Social work support/coping Psychosocial interventions 15 minutes was spent in direct contact with Mariah Ferguson. She shared that she is doing well, and is looking forward to being discharged today. She shared Mariah Ferguson?s progress and expressed gratitude during this chapter of motherhood. She was encouraged to reach out regarding any concerns. She verbalized understanding. Collaborations Patient, medical team Specific referrals to enhance community supports (include existing and new resources) Referral was made to [employer], Post Partum information Handoff Required? No Social Work will take lead to arrange post-acute care services and continue work with the care team as patient progresses towards discharge No Next Steps/Plan (including hand-off): Social work will continue to provide support throughout NNICU admission Signature: Lorane Duos, LCSW Contact Information: [phone]

## 2024-02-27 NOTE — Discharge Summary [5]
 Obstetrics - Discharge SummaryPatient Data: Patient Name: Mariah Ferguson Age: 29 y.o. DOB: 12-23-1995 MRN: [id number] DATE OF ADMISSION: 02/17/2024     TIME:  6:35 AMDATE OF DISCHARGE:  02/27/2020    TIME: 0942 Past Medical History[1] Past Surgical History[2] Social History Family History Social History Tobacco Use  Smoking status: Never  Smokeless tobacco: Never Substance Use Topics  Alcohol use: Not Currently  Family History Problem Relation Age of Onset  Polycystic ovary syndrome Mother   Diabetes Type 2 Mother   Hypertension Mother   Ovarian cancer Maternal Great-Grandmother   Allergies Allergies[3]  EDD: Estimated Date of Delivery: 04/16/24 Gestational Age:[redacted]w[redacted]d    Antepartum complications: Patient Active Problem List  Diagnosis Date Noted  EMPOWER Study 02/26/2024   HIC # [id number] - EMPOWER: Trauma-informed approach to timely detection and management of early postpartum hypertensionYale School of Public HealthSponsor: PCOR[employer] ([facility])Role Name Email/In-Basket Phone Number Dual Principal Investigator Kellie Mems, PhD [email or web address] [phone] Dual Principal Investigator Heather  Wenceslao, MD, MS [email or web address]  Co-Investigators Comer Shams, MDAnnalies Norberta, MD In-Basket  Lead Study Doula/Postpartum Care Coordinator Carmell Garter, MSW [email or web address] [phone] Lead Study Coordinator Samual GEANNIE Louder, MPH [phone] EMPOWER Research Team Phone #: [phone] Doula Team Phone # [phone] The study team met with Mariah Ferguson on 02/26/2024 to discuss details of the EMPOWER Study. The EMPOWER study aims to: improve clinical and mental health outcomes among patients who just delivered their baby and who may be at risk for hypertension, mental health, and/or cardiovascular complications. Enrolled participants will receive a blood pressure cuff to keep along with instructions on how to use it. Participants will be asked to take their blood pressure at baseline and complete a survey. After discharge from their delivery admission with [facility], enrolled participants receive remote blood pressure monitoring with [employer] as well as virtual appointments with a doula for up to 12-weeks postpartum. The study team presented the protocol rationale, study design, alternatives (including options not related to study participation), the rights of participants, and potential risks and benefits. In addition, we discussed that participation is voluntary and that all participants can withdraw at any time without penalty. Mariah Ferguson expressed understanding of the information presented and all questions were answered. Mariah Ferguson affirmed interest in participating and signed the consent form. she received a copy of the consent form to take home. Mariah Ferguson also received a standard sized study Omron blood pressure cuff.Electronically Signed by Mariah Ferguson, February 26, 2024  MITEY PROGRAM 02/24/2024   Patient enrolled in the [place] Program to monitor HTN. Patient using own blood pressure cuff. She has a cuff home. Patient received education packet, warning signs document, calling precautions, and educated in blood pressure cuff use. Referrals made and follow up per program protocol. 3 day visit reuqested  Gestational hypertension, third trimester 02/23/2024   02/20/24 Nifedipine  30XL  S/P cesarean section 02/23/2024  Delivery of pregnancy by cesarean section 02/23/2024  Uterine contractions 02/18/2024  Fetal growth restriction antepartum 01/07/2024   22w: Early onset sFGR, genetics [x] , amnio [x]  results Neg CMV Neg, wkly dopplers, Q2w EFW, NST at 32w+11/26: Est. FW: 429 gm 0 lb 15 oz 3.8 % 1/13: Est. FW: 988 gm 2 lb 3 oz < 1 %   Shirodkar cerclage with TWO SUTURES PRESENT 12/18/2023   02/20/24 - removed-Two Shirodkar cerclages placed 11/26, both knots at 12 o'clock-Would likely benefit from removal in OR w spinal  Short cervical length during pregnancy in second trimester 11/27/2023   FAS 11/5 at  [redacted]w[redacted]d: Complete anatomy visualized and unremarkable  Short cervix (2.49 cm) without dilation Recommendations: Start nightly vaginal progesterone  200mg . Return for follow-up cervical length in one week. Regardless of cervical length next week, also recommend follow up for fetal growth in 3 weeks given borderline measurements today.Rx sent 11/5Rpt CL: 11/21 1.71cm, 11/26 1.2cm, internal os 2cm -> cerclage placed  Supervision of normal first pregnancy, antepartum 09/09/2023   Pregnancy Problem List:      [X]  IPV folder  [X]  20w folder  [x]  28w folder1T: managing nausea with MJHx PCOS, hx ovarian cyst with emergent surgery r/t concern for torsion, hx hydrosalpinx with multiple admissionsHx pre-DM A1C (5.9) but 5.2 in 1TStarting BMI 17Presumptive UTI 15w, Macrobid  Rx'd - didn't complete, TOC NegShortened CL: Shirodkar cerclage placed 11/26/25Suspected sFGR 23wDating:			10/04/23 12+0, c/w LMP datingConsults:		MFM for cerclage, Genetics for early onset FGRLDA?:			NoAnatomy:		NL anatomy, anterior placenta, measurements sl lagging, but not diagnostic - plan f/u growth in 3 wks; *shortened cervix at 2.49cm --> PV Prog, rpt CL in 1 wk	Rpt CL 1wk:	1.71cm. Pt cancelled 12/23 and 12//30 scan	29w scan:	Est. FW: 988 gm 2 lb 3 oz < 1 % normal Dopplers	Growth 3wk	[ ]  1/20/26Rhogam:		Not indicatedBlood products?	AcceptsFlu vax:			ReceivedCOVID vax:		DeclinesThird tri mood:TDap:RSV:			NADelivery timing:Pedi:Childbirth Zi:Ojanm pref:Consents?		NAInfant feeding:Position:Circ?GBS/date:	2nd GBS:NST/MVP:		Early dopplers given sFGR, NST 32+CNMs:LS [x] 	EH [X] 	KN [x] 	HM [ ] 	SC [X] 	SD [ ]  met SGS1st Trimester OB labs: 10/1/25Type & screen:	A+, Ab screen negHgb electro:	AAH&H:		12.9/38.8	MCV:		95.3Platelets:	252Rubella:	Imm	HIV:		Neg	RPR:		NR	Hep B sAg:	NegHep C:		NegHgbA1c:	5.2Urine culture:	<10k mixedPap:		05/2023 pHPV NegGC/[place]:		8/18: neg/neg			CffDNA:	WNLCarrier testing:	Negative3rd Trimester OB Labs: 12/11/25H&H:		12.3/35.9	Platelets:	290	RPR:		Non-reactiveHIV:		negative1hr GCT:	128  Left ovarian cyst 04/01/2022   02/2022 emergent ovarian cystectomy r/t concern for torsion  Pre-diabetes 01/24/2022   12/2021 HgbA1C 5.9%Reviewed lifestyle changesRepeat 38m, ordered, not done10/1/25 5.2%   Hydrosalpinx 01/17/2022   Left, inpt admission 06/2021  Right ovarian cyst 01/17/2022   01/17/22 ED US : right adnexa demonstrates 5.9 x 5.2 x 4.8 cm multiloculated cystic structure that may represent a combination of hemorrhagic ovarian cysts versus a hemorrhagic ovarian cyst and hematosalpinx.Rpt US  in 1 month ordered: done in ED 2/10 when presented with pain: Previously seen likely right hemorrhagic cyst is now significantly decreased in size, measuring 1.4 x 1.4 x 1.4 cm  PCOS (polycystic ovarian syndrome) 01/17/2022   Multiple follicles seen on USMinastrin Rx'd 01/2022 Delivery Date and Time:  Information for the patient's newborn:  Catheline, Hixon [[id number]] 02/23/2024 8:47 PMDelivery Type: primary cesarean section, low transverse incisionAdmitting Physician: Leita JAYSON Laundry, CNM Admission Diagnosis: Fetal growth restriction antepartum [O36.5990]Uterine contractions [O47.9]S/P cesarean section [Z98.891]Delivery of pregnancy by cesarean section [O82]Physical Exam:General: NAD CV: Regular ratePulm: Normal work of breathingAbdomen: Soft, appropriately tender, non-distendedIncision: Steris. Clean dry and intact without erythema, edema or drainageUterine Fundus: firm, appropriately tender, below umbilicus LE: Warm and well perfused. Trace edema without tenderness, erythema, or cordsHospital Course: Please refer to the H&P dated 02/17/2024 for the complete details regarding the patient's HPI. Briefly, this is a 29 y.o. now G1P0101 previously at [redacted]w[redacted]d who was admitted for labor evaluation in setting of painful contractions at 31+4 weeks. Pregnancy notable for shortened cervical length with cerclage placement in November 2025 and fetal growth restriction.  Cerclage removal performed in OR on 02/18/2024. Remained inpatient with threatened preterm labor.  Received Nifedipine  for tocolysis.  Diagnosed with gestational hypertension during this period.  Ultimately, delivered for progressing labor course and NRFHT remote from delivery on 02/23/2024.  Her intraoperative course was complicated by preterm labor and gHTN. Please refer to operative note dated 02/23/2024. She had a CS and delivered a vigorous female infant, Apgars 8/9, weight 1120 g. Her postpartum course was complicated by surveillance of  her gHTN and back/rib pain.  Initiated on Nifedpine 30 mg XL daily with good result.  Back/rib pain resolved post flexeril .  Renal u/s within normal.  Candida treated with Diflucan  x1 dose.  Her postpartum Hct was 36.70. By POD#2 her pain was adequately controlled on PO pain meds, and she was tolerating a general diet without N/V, ambulating without difficulty, voiding spontaneously without dysuria, and passing flatus. Rh positive, rubella immune. She was discharged home on POD#4. For birth control, she was counseled on LARC and bridge methods and received copper  IUD during her delivery.Discharge Diagnoses: Cesarean Delivery, gHTNDischarged Condition: goodDischarge Disposition: homeIssues to be addressed after discharge: Cesarean Delivery - Follow with Midwife group in 2-6 weeksgHTN - MITEY enrolled Patient Instructions: Current Discharge Medication List START taking these medications  Details acetaminophen  (TYLENOL ) 325 mg tablet Take 3 tablets (975 mg total) by mouth every 6 (six) hours.Qty: 30 tablet, Refills: 0Start date: 02/26/2024  ibuprofen  (ADVIL ,MOTRIN ) 600 mg tablet Take 1 tablet (600 mg total) by mouth every 6 (six) hours.Qty: 60 tablet, Refills: 0Start date: 02/26/2024  lidocaine  4 % topical patch Place 1 patch over 12 hours onto the skin every 24 hours. Remove & Discard patch within 12 hours or as directed by MDQty: 30 patch, Refills: 0Start date: 02/26/2024  NIFEdipine  XL (PROCARDIA -XL) 30 mg 24 hr tablet Take 1 tablet (30 mg total) by mouth daily.Qty: 30 tablet, Refills: 1Start date: 02/26/2024  oxyCODONE  (ROXICODONE ) 5 mg Immediate Release tablet Take 1 tablet (5 mg total) by mouth every 4 (four) hours as needed.Qty: 8 tablet, Refills: 0Start date: 02/26/2024  polyethylene glycol (MIRALAX ) 17 gram packet Take 1 packet (17 g total) by mouth nightly as needed for constipation (Constipation). Mix in 8 ounces of water , juice, soda, coffee or tea prior to taking.Qty: 14 each, Refills: 0Start date: 02/26/2024  senna (SENOKOT) 8.6 mg tablet Take 1 tablet (8.6 mg total) by mouth daily as needed for constipation (Constipation).Qty: 10 tablet, Refills: 0Start date: 02/26/2024  simethicone  (MYLICON) 125 mg chewable tablet Take 1 tablet (125 mg total) by mouth every 6 (six) hours as needed for flatulence for up to 10 days.Qty: 30 tablet, Refills: 0Start date: 02/26/2024, End date: 03/07/2024   STOP taking these medications   doxylamine -pyridoxine , vit B6, (BONJESTA ) 20-20 mg TbID    ondansetron  (ZOFRAN -ODT) 4 mg disintegrating tablet    progesterone  (PROMETRIUM ) 200 mg capsule     There are no outpatient Patient Instructions on file for this admission.Postpartum:Information for the patient's newborn:  Mariah Ferguson, Mariah Ferguson [[id number]] DeliveryDelivered by:   (If Midwife) Collaborating MD:   02/23/2024 8:47 PM by  C-Section, Low TransverseSex:  female Gestational Age: [redacted]w[redacted]d      APGARS  One minute Five minutes Ten minutes Skin color:       Heart rate:       Grimace:       Muscle tone:       Breathing:       Totals: 8  9    Resuscitation: Resuscitation Comment: Called to attend primary c/s delivery of a 29 y/o G1P0 by Dr. Conrad of a 32+3 week infant, complicated by NRFHT. Maternal history significant for PTL, hx of cerclage (removed 1/27), gHTN (on nefidpine), severe early FGR. Beta complete 1/28. ROM at time of delivery for clear fluid. GBS negative, Hep B negative, HIV negative, VDRL/RPR non-reactive, Rubella immune. Maternal blood type A positive, antibody screen negative.Infant emerged through nuchal cord x1 with [facility] with spontaneous cry and brought to radiant warmer to be  dried, stimulated and bulb suctioned. HR>100 with good tone, good respiratory rate and pink in color. Given CPAP around 1 MOL for WOBA/P: Preterm infant born by c/s delivery, complicated by decels and NRFHT, transitioning to extrauterine life well. Transferred to NICU for further medical care. Please see H&P for full history and physical examination.Mariah Fiorentino, PA-C Placenta: Delivered:       appearanceNewborn Measurements:Weight: 2 lb 7.5 oz (1120 g)  Other providers:   @DISCHARGEMEDSLIST @Plan :[facility] [phone] Kayla Hazard [facility] [place]  [phone], Allean, CNM46 [facility] [place] [phone], Camie, CNM[address] [place] [phone] on 3/17/2026[facility] [id number] Kayla Hazard [facility] [place]  [phone] Signed:Meaghen GORMAN Ferguson, APRN2/5/20268:34 AM [1] Past Medical History:Diagnosis Date  Abdominal pain, unspecified abdominal location 06/16/2022  Hydrosalpinx 01/17/2022  Left, inpt admission 06/2021  PCOS (polycystic ovarian syndrome) 01/17/2022  Multiple follicles seen on US   PID (acute pelvic inflammatory disease)   06/2021  Pre-diabetes 01/24/2022  12/2021 HgbA1C 5.9% Repeat 58m, ordered  Right ovarian cyst 01/17/2022  01/17/22 ED US : right adnexa demonstrates 5.9 x 5.2 x 4.8 cm multiloculated cystic structure that may represent a combination of hemorrhagic ovarian cysts versus a hemorrhagic ovarian cyst and hematosalpinx. Rpt US  in 1 month [ ]  ordered   Trichomonal infection   06/2021 [2] Past Surgical History:Procedure Laterality Date  CYST REMOVAL   [3] No Known Allergies

## 2024-02-27 NOTE — Discharge Instructions [18]
 Cesarean Delivery, Care AfterHOME CARE INSTRUCTIONS Only take over-the-counter or prescription medications as directed by your health care provider.Do not drink alcohol, especially if you are breastfeeding or taking medication to relieve pain.Continue to use good perineal care. Good perineal care includes:Wiping your perineum from front to back.Keeping your perineum clean.Clean your incision gently with soap and water  every day, and then pat it dry. Keep the incision site dry, using guaze if it is in a skin fold.Hug a pillow when coughing or sneezing until your incision is healed. This helps to relieve pain.Do not use tampons or douche until your health care provider says it is okay.Drink enough fluids to keep your urine clear or pale yellow.Eat high-fiber foods such as whole grain cereals and breads, brown rice, beans, and fresh fruits and vegetables every day and use your medications to prevent constipation.SEEK MEDICAL CARE IF: You are passing large clots or having heavy bleeding (more than a period) from your vagina. You may have light bleeding for several weeks.You have a foul smelling discharge from your vagina.You have trouble urinating, urinating frequently, or have pain when you urinate.You have increasing redness, pain, or swelling near your incision.You have pus draining from your incision.Your incision is separating.You have painful, hard, or reddened breasts.You have a severe headache.You have blurred vision or see spots.You feel sad or depressed.You have thoughts of hurting yourself or your newborn.SEEK IMMEDIATE MEDICAL CARE IF:You have persistent pain.You have chest pain.You have shortness of breath.You faint.You have leg pain.You have stomach pain.Your vaginal bleeding saturates 2 or more sanitary pads in 1 hour. NICU Breast Pumping PlanRefer to 'Understanding Postpartum Health and Baby Care' Book (Pages 32-46) Hold baby skin-to-skin when possible Wash hands before pumping and pump parts after each use with soap and waterExpress your breastmilk at least 8 times in 24 hoursMassage breasts before and during expressing milk Pump for 15-20 minutes (consider using/making a hands free pumping bra)Hand express after pumping for a few minutes Label any breastmilk collected (get labels from Milk Room outside [facility] NICU)Bring any breastmilk to the baby?s fridge for the first 7 days (Milk Room after 1st week) While baby in NICU use their storage guidelines for breastmilk  If you have questions or concerns about pumping, ask to see one of the lactation consultants.Breastfeed baby as soon as possible and as often as possible Make sure you have a breast pump orderedReview Newborn ICU Lactation PacketBreastfeeding Discharge Instructions Refer to 'Understanding Postpartum Health and Baby Care' Book (Pages 32-46) for guidance if questions ariseIs it a good latch?Does it hurt to breastfeed?During feeding, listen for sound of baby swallowing, watch to see if baby?s jaw is movingAfter baby feeds, your nipple should be round and not creased or pinched looking.  If Latch is painful or pinchingTry pulling down on chin to widen latch while baby is suckingIf no improvement- put your finger in the corner of baby?s mouth to break the suction and take baby off breast.  Try again to latch baby.  If the latch is too painful or baby is not latching, pump your breasts for 15-20 minutes to protect your milk supply and get help from a advertising copywriter. For Nipple pain- Apply cold compresses, express breastmilk and dab on nipple and allow to air dry,  use lanolin as needed after feeding baby For engorgement (When the amount of breastmilk starts increasing and your breasts feel fuller/heavier) Breastfeed baby when baby show feeding cuesBreastfeed oftenOffer both breasts during a feedingApply ice before and after feedings as neededPerform reverse  pressure softening- use your fingers to apply pressure to the base of your nipple for 1 minute to make the areola soft Perform lymphatic breast massage- a light breast massage while laying back from nipple to armpit for a few minutes to help with swelling If still uncomfortable after a feeding express milk only until you are comfortable (do not empty breasts)Remember babies may ?cluster feed? (feed frequently), especially in the evening and during growth spurtsIf breastfeeding is going well and you want/need to give a bottle of pumped breastmilk, it is ideal to wait at least 3-4 weeks and use paced bottle feeding.  Average intake of breastmilk for baby?s ageFirst 24 hours 2-10 ml per feeding session 77-34 days old 5-15 ml per feeding session 50-43 days old 15-30 ml per feeding session44-57 days old 30-60 ml per feeding session47-77 weeks old 1.5-2 oz (45-60 ml) per feeding sessionBy 4 weeks old 2.5-5 oz (80-150 ml) per feeding sessionIf you need breastfeeding help once home:Breastfeeding [facility] Lactation Care ClinicOur Advanced Practice Provider Lactation Consultants provide individualized, one-to-one appointments, available in person or through video visits.[facility], [address], [place],  [address] [phone]) to schedule (Insurance accepted) [facility] New Parent Support Group For details visit [email or web address] [facility] Breastfeeding Peer Counseling ProgramFor mothers who are enrolled in any of the [facility] sites,Call [phone] staff officeCan leave a message for quick over the phone lactation advice or questions [phone] For parents who get their care through [facility], they have a lactation consultant in the office named Derry Calin RNContact them at [phone] When to call the doctor:Pediatrician: If baby is not feeding well -  less than 8 times a day or causing nipple damage, less wet diapers and/or stools, eyes and/or skin look yellow, or any concerns.Lactation and Obstetrician: Breast infection/mastitis: fever, chills, flu-like symptoms, red, warm/hot/firm areas on breast. Nipples are painful, cracked, bruised, bleeding.Nursing Patient Discharge InstructionsPatient reason for hospital stay:Childbirth - Cesarean SectionAttending Physician: Katheryne Leita BROCKS, [facility] Your Doctor if:If you have bleeding, swelling, redness, increased warmth, increased drainage, more pain/discomfort from the wound, If you have trouble breathing, fast or slow heart beat, dizziness that does not go away after resting for 15 minutes, If you have more angina/chest pain or if your normal angina/chest pain changes, If you have nausea or poor appetite, If you have weight gain of 2-3 pounds or more a day, or swelling in your ankles, legs or abdomen. Weigh yourself daily, If you have more menstural bleeding than usual, If you have blood in your urine or stool, if you bleed easily, bleeding from gums, mouth, vagina, or rectum, If you have temperature (fever) higher than 100.4 degrees farenheit (F), and If you are feeling more tired or weak than usualCall Your Obstetrician/Midwife pq:Qzczm 100.4 degrees or higher, Burning or pain when using the bathroom, Heavy vaginal bleeding (flow), Smelly vaginal flow, Oozing (discharge), swelling, redness around your stitches, Your stitches open, You have a red, hot or tender area on either breast/chest, Sharp pains in your abdomen, breast or chest, Blurry vision or dizziness, Headache that does not go away, If you feel very sad or depressed, and If you have thoughts of harming yourself or babyPatient Care Instructions:Be sure all caregivers wash their hands, Keep dressing or suture line clean and dry, and Monitor incision for redness, odor, drainage, swellingPatient Information:Understanding Postpartum Health and Baby Care Book, Turning Heads, and Read to growAfter discharge from this program, if you experience thoughts of  suicide, please use one of the following sources for help: Call your outpatient Psychiatric provider(s): for example, psychiatrist,therapist, and/or home service; Call 988 Suicide Help Hotline and request suicide prevention and crisis intervention: Call the National Prevention Lifeline at 3393875751 MERRILYN): Go to your local hospital emergency department Please login to your MyChart account, click on the Your Menu link. Under the heading Resources, click on the Education Library. Under the heading After Birth and Newborn Care, click on Understanding Postpartum Health and Baby Care Book, After Birth Care, or Newborn Care to view educational materials provided during your stay.New to MyChart? The last item on this After Visit Summary is information on creating a MyChart account that includes an activation code. You can also request a new activation code via the Sign Up Now link.

## 2024-02-27 NOTE — Plan of Care [1000001]
 Plan of Care Overview/ Patient StatusPatient going home with support person, baby remains in NBICU.  patient verbalized postpartum discharge instruction

## 2024-02-28 ENCOUNTER — Encounter
Admit: 2024-02-28 | Payer: PRIVATE HEALTH INSURANCE | Attending: Obstetrics and Gynecology | Primary: Obstetrics and Gynecology

## 2024-03-04 ENCOUNTER — Encounter: Admit: 2024-03-04 | Payer: PRIVATE HEALTH INSURANCE | Attending: Pharmacist | Primary: Obstetrics and Gynecology

## 2024-03-05 ENCOUNTER — Ambulatory Visit: Admit: 2024-03-05 | Payer: PRIVATE HEALTH INSURANCE | Primary: Obstetrics and Gynecology

## 2024-03-11 ENCOUNTER — Ambulatory Visit: Admit: 2024-03-11 | Payer: PRIVATE HEALTH INSURANCE | Primary: Obstetrics and Gynecology

## 2024-03-16 ENCOUNTER — Encounter
Admit: 2024-03-16 | Payer: PRIVATE HEALTH INSURANCE | Attending: Obstetrics and Gynecology | Primary: Obstetrics and Gynecology

## 2024-03-18 ENCOUNTER — Ambulatory Visit: Admit: 2024-03-18 | Payer: PRIVATE HEALTH INSURANCE | Primary: Obstetrics and Gynecology

## 2024-04-07 ENCOUNTER — Encounter
Admit: 2024-04-07 | Payer: PRIVATE HEALTH INSURANCE | Attending: Obstetrics and Gynecology | Primary: Obstetrics and Gynecology
